# Patient Record
Sex: Female | Born: 1985 | Race: Black or African American | Hispanic: No | Marital: Single | Smoking: Never smoker
Health system: Southern US, Community
[De-identification: ages and names within clinical notes are randomized; demographics above are authoritative.]

## PROBLEM LIST (undated history)

## (undated) DIAGNOSIS — E559 Vitamin D deficiency, unspecified: Secondary | ICD-10-CM

## (undated) DIAGNOSIS — E039 Hypothyroidism, unspecified: Secondary | ICD-10-CM

## (undated) DIAGNOSIS — D649 Anemia, unspecified: Secondary | ICD-10-CM

## (undated) HISTORY — PX: INDUCED ABORTION: SHX677

## (undated) HISTORY — DX: Vitamin D deficiency, unspecified: E55.9

---

## 2007-04-10 ENCOUNTER — Other Ambulatory Visit: Payer: Self-pay

## 2007-04-11 ENCOUNTER — Inpatient Hospital Stay: Payer: Self-pay | Admitting: Internal Medicine

## 2007-04-11 ENCOUNTER — Other Ambulatory Visit: Payer: Self-pay

## 2007-11-25 ENCOUNTER — Inpatient Hospital Stay: Payer: Self-pay | Admitting: Internal Medicine

## 2007-11-25 ENCOUNTER — Other Ambulatory Visit: Payer: Self-pay

## 2007-12-05 ENCOUNTER — Observation Stay: Payer: Self-pay | Admitting: Internal Medicine

## 2007-12-05 ENCOUNTER — Other Ambulatory Visit: Payer: Self-pay

## 2007-12-31 ENCOUNTER — Emergency Department: Payer: Self-pay | Admitting: Emergency Medicine

## 2008-01-03 ENCOUNTER — Other Ambulatory Visit: Payer: Self-pay

## 2008-01-04 ENCOUNTER — Other Ambulatory Visit: Payer: Self-pay

## 2008-01-04 ENCOUNTER — Inpatient Hospital Stay: Payer: Self-pay | Admitting: Internal Medicine

## 2008-01-12 ENCOUNTER — Observation Stay: Payer: Self-pay | Admitting: Internal Medicine

## 2008-02-16 ENCOUNTER — Inpatient Hospital Stay: Payer: Self-pay | Admitting: Internal Medicine

## 2008-02-16 ENCOUNTER — Other Ambulatory Visit: Payer: Self-pay

## 2008-03-08 ENCOUNTER — Emergency Department: Payer: Self-pay | Admitting: Emergency Medicine

## 2008-05-23 ENCOUNTER — Inpatient Hospital Stay: Payer: Self-pay | Admitting: Internal Medicine

## 2008-06-21 ENCOUNTER — Inpatient Hospital Stay: Payer: Self-pay | Admitting: Internal Medicine

## 2008-06-25 ENCOUNTER — Inpatient Hospital Stay: Payer: Self-pay | Admitting: Psychiatry

## 2009-01-24 ENCOUNTER — Inpatient Hospital Stay: Payer: Self-pay | Admitting: Internal Medicine

## 2009-01-28 ENCOUNTER — Inpatient Hospital Stay: Payer: Self-pay | Admitting: Psychiatry

## 2009-05-04 ENCOUNTER — Inpatient Hospital Stay (HOSPITAL_COMMUNITY): Admission: EM | Admit: 2009-05-04 | Discharge: 2009-05-06 | Payer: Self-pay | Admitting: Emergency Medicine

## 2009-08-12 ENCOUNTER — Ambulatory Visit: Payer: Self-pay | Admitting: Family Medicine

## 2009-11-09 ENCOUNTER — Ambulatory Visit: Payer: Self-pay | Admitting: Internal Medicine

## 2009-11-09 ENCOUNTER — Encounter (INDEPENDENT_AMBULATORY_CARE_PROVIDER_SITE_OTHER): Payer: Self-pay | Admitting: Family Medicine

## 2009-11-09 LAB — CONVERTED CEMR LAB
ALT: 23 units/L (ref 0–35)
AST: 13 units/L (ref 0–37)
Albumin: 4.4 g/dL (ref 3.5–5.2)
Alkaline Phosphatase: 104 units/L (ref 39–117)
BUN: 19 mg/dL (ref 6–23)
Basophils Absolute: 0 10*3/uL (ref 0.0–0.1)
Basophils Relative: 1 % (ref 0–1)
CO2: 23 meq/L (ref 19–32)
Calcium: 9.7 mg/dL (ref 8.4–10.5)
Chloride: 92 meq/L — ABNORMAL LOW (ref 96–112)
Creatinine, Ser: 1.06 mg/dL (ref 0.40–1.20)
Eosinophils Absolute: 0.2 10*3/uL (ref 0.0–0.7)
Eosinophils Relative: 3 % (ref 0–5)
Glucose, Bld: 527 mg/dL — ABNORMAL HIGH (ref 70–99)
HCT: 38.2 % (ref 36.0–46.0)
Hemoglobin: 13.8 g/dL (ref 12.0–15.0)
Hgb A1c MFr Bld: 13.5 % — ABNORMAL HIGH (ref <5.7)
Lymphocytes Relative: 33 % (ref 12–46)
Lymphs Abs: 2.1 10*3/uL (ref 0.7–4.0)
MCHC: 36.1 g/dL — ABNORMAL HIGH (ref 30.0–36.0)
MCV: 84.1 fL (ref 78.0–100.0)
Microalb, Ur: 67.62 mg/dL — ABNORMAL HIGH (ref 0.00–1.89)
Monocytes Absolute: 0.4 10*3/uL (ref 0.1–1.0)
Monocytes Relative: 6 % (ref 3–12)
Neutro Abs: 3.7 10*3/uL (ref 1.7–7.7)
Neutrophils Relative %: 58 % (ref 43–77)
Platelets: 264 10*3/uL (ref 150–400)
Potassium: 4.7 meq/L (ref 3.5–5.3)
RBC: 4.54 M/uL (ref 3.87–5.11)
RDW: 12.3 % (ref 11.5–15.5)
Sodium: 128 meq/L — ABNORMAL LOW (ref 135–145)
TSH: 1.488 microintl units/mL (ref 0.350–4.500)
Total Bilirubin: 0.8 mg/dL (ref 0.3–1.2)
Total Protein: 7.4 g/dL (ref 6.0–8.3)
Vit D, 25-Hydroxy: 14 ng/mL — ABNORMAL LOW (ref 30–89)
WBC: 6.4 10*3/uL (ref 4.0–10.5)

## 2009-11-29 ENCOUNTER — Ambulatory Visit: Payer: Self-pay | Admitting: Internal Medicine

## 2009-12-12 ENCOUNTER — Encounter (INDEPENDENT_AMBULATORY_CARE_PROVIDER_SITE_OTHER): Payer: Self-pay | Admitting: Family Medicine

## 2009-12-12 ENCOUNTER — Ambulatory Visit: Payer: Self-pay | Admitting: Internal Medicine

## 2009-12-12 LAB — CONVERTED CEMR LAB: Microalb, Ur: 18.78 mg/dL — ABNORMAL HIGH (ref 0.00–1.89)

## 2010-01-31 ENCOUNTER — Ambulatory Visit: Payer: Self-pay | Admitting: Internal Medicine

## 2010-01-31 ENCOUNTER — Encounter (INDEPENDENT_AMBULATORY_CARE_PROVIDER_SITE_OTHER): Payer: Self-pay | Admitting: Family Medicine

## 2010-01-31 LAB — CONVERTED CEMR LAB
Aldosterone, Serum: 24
Cortisol, Plasma: 8.8 ug/dL

## 2010-05-03 ENCOUNTER — Emergency Department: Payer: Self-pay | Admitting: Emergency Medicine

## 2010-06-08 ENCOUNTER — Encounter: Payer: Self-pay | Admitting: Internal Medicine

## 2010-07-04 ENCOUNTER — Encounter (INDEPENDENT_AMBULATORY_CARE_PROVIDER_SITE_OTHER): Payer: Self-pay | Admitting: Internal Medicine

## 2010-07-04 ENCOUNTER — Other Ambulatory Visit
Admission: RE | Admit: 2010-07-04 | Discharge: 2010-07-04 | Payer: Self-pay | Source: Home / Self Care | Admitting: Family Medicine

## 2010-07-04 ENCOUNTER — Other Ambulatory Visit: Payer: Self-pay | Admitting: Family Medicine

## 2010-07-04 LAB — CONVERTED CEMR LAB
Chlamydia, DNA Probe: NEGATIVE
Cholesterol: 126 mg/dL (ref 0–200)
DHEA-SO4: 192 ug/dL (ref 35–430)
FSH: 4.4 milliintl units/mL
GC Probe Amp, Genital: NEGATIVE
HCT: 35 % — ABNORMAL LOW (ref 36.0–46.0)
HDL: 65 mg/dL (ref >39)
Hemoglobin: 11.4 g/dL — ABNORMAL LOW (ref 12.0–15.0)
LDL Cholesterol: 49 mg/dL (ref 0–99)
MCHC: 32.6 g/dL (ref 30.0–36.0)
MCV: 91.4 fL (ref 78.0–100.0)
Platelets: 422 10*3/uL — ABNORMAL HIGH (ref 150–400)
Prolactin: 4.5 ng/mL
RBC: 3.83 M/uL — ABNORMAL LOW (ref 3.87–5.11)
RDW: 16.8 % — ABNORMAL HIGH (ref 11.5–15.5)
TSH: 1.681 microintl units/mL (ref 0.350–4.500)
Total CHOL/HDL Ratio: 1.9
Triglycerides: 61 mg/dL (ref <150)
VLDL: 12 mg/dL (ref 0–40)
WBC: 7 10*3/uL (ref 4.0–10.5)

## 2010-07-05 ENCOUNTER — Encounter (INDEPENDENT_AMBULATORY_CARE_PROVIDER_SITE_OTHER): Payer: Self-pay | Admitting: *Deleted

## 2010-09-11 ENCOUNTER — Encounter (INDEPENDENT_AMBULATORY_CARE_PROVIDER_SITE_OTHER): Payer: Self-pay | Admitting: *Deleted

## 2010-09-11 LAB — CONVERTED CEMR LAB
Basophils Absolute: 0 K/uL
Basophils Relative: 0 %
Eosinophils Absolute: 0.2 K/uL
Eosinophils Relative: 3 %
HCT: 37.9 %
Hemoglobin: 12.2 g/dL
Iron: 44 ug/dL
Lymphocytes Relative: 34 %
Lymphs Abs: 2.4 K/uL
MCHC: 32.2 g/dL
MCV: 92.2 fL
Monocytes Absolute: 0.4 K/uL
Monocytes Relative: 5 %
Neutro Abs: 4.1 K/uL
Neutrophils Relative %: 58 %
Platelets: 244 K/uL
RBC: 4.11 M/uL
RDW: 13.8 %
Retic Ct Pct: 1 %
Saturation Ratios: 12 % — ABNORMAL LOW
TIBC: 370 ug/dL
UIBC: 326 ug/dL
WBC: 7 10*3/microliter

## 2010-09-17 ENCOUNTER — Emergency Department: Payer: Self-pay | Admitting: Emergency Medicine

## 2010-09-20 LAB — BASIC METABOLIC PANEL
BUN: 21 mg/dL (ref 6–23)
BUN: 3 mg/dL — ABNORMAL LOW (ref 6–23)
CO2: 26 mEq/L (ref 19–32)
Calcium: 8.1 mg/dL — ABNORMAL LOW (ref 8.4–10.5)
Calcium: 9.1 mg/dL (ref 8.4–10.5)
Creatinine, Ser: 0.77 mg/dL (ref 0.4–1.2)
GFR calc Af Amer: >60 mL/min (ref >60)
GFR calc non Af Amer: >60 mL/min (ref >60)
GFR calc non Af Amer: >60 mL/min (ref >60)
Glucose, Bld: 258 mg/dL — ABNORMAL HIGH (ref 70–99)
Glucose, Bld: 391 mg/dL — ABNORMAL HIGH (ref 70–99)
Potassium: 2.8 mEq/L — ABNORMAL LOW (ref 3.5–5.1)
Sodium: 139 mEq/L (ref 135–145)
Sodium: 139 mEq/L (ref 135–145)

## 2010-09-20 LAB — CBC
HCT: 34.2 % — ABNORMAL LOW (ref 36.0–46.0)
HCT: 36.1 % (ref 36.0–46.0)
Hemoglobin: 11.4 g/dL — ABNORMAL LOW (ref 12.0–15.0)
Hemoglobin: 12.2 g/dL (ref 12.0–15.0)
Hemoglobin: 18 g/dL — ABNORMAL HIGH (ref 12.0–15.0)
MCHC: 33.4 g/dL (ref 30.0–36.0)
MCHC: 33.8 g/dL (ref 30.0–36.0)
MCV: 94.4 fL (ref 78.0–100.0)
Platelets: 172 10*3/uL (ref 150–400)
RBC: 3.83 MIL/uL — ABNORMAL LOW (ref 3.87–5.11)
RBC: 5.73 MIL/uL — ABNORMAL HIGH (ref 3.87–5.11)
RDW: 12.3 % (ref 11.5–15.5)
WBC: 14.8 10*3/uL — ABNORMAL HIGH (ref 4.0–10.5)
WBC: 7.1 10*3/uL (ref 4.0–10.5)

## 2010-09-20 LAB — LIPID PANEL
Cholesterol: 156 mg/dL (ref 0–200)
HDL: 51 mg/dL (ref >39)
LDL Cholesterol: 85 mg/dL (ref 0–99)
Total CHOL/HDL Ratio: 3.1 RATIO

## 2010-09-20 LAB — GLUCOSE, CAPILLARY
Glucose-Capillary: 103 mg/dL — ABNORMAL HIGH (ref 70–99)
Glucose-Capillary: 112 mg/dL — ABNORMAL HIGH (ref 70–99)
Glucose-Capillary: 175 mg/dL — ABNORMAL HIGH (ref 70–99)
Glucose-Capillary: 185 mg/dL — ABNORMAL HIGH (ref 70–99)
Glucose-Capillary: 248 mg/dL — ABNORMAL HIGH (ref 70–99)
Glucose-Capillary: 33 mg/dL — CL (ref 70–99)
Glucose-Capillary: 37 mg/dL — CL (ref 70–99)
Glucose-Capillary: 58 mg/dL — ABNORMAL LOW (ref 70–99)
Glucose-Capillary: 60 mg/dL — ABNORMAL LOW (ref 70–99)
Glucose-Capillary: 60 mg/dL — ABNORMAL LOW (ref 70–99)
Glucose-Capillary: 61 mg/dL — ABNORMAL LOW (ref 70–99)
Glucose-Capillary: 66 mg/dL — ABNORMAL LOW (ref 70–99)
Glucose-Capillary: 67 mg/dL — ABNORMAL LOW (ref 70–99)
Glucose-Capillary: 92 mg/dL (ref 70–99)
Glucose-Capillary: 94 mg/dL (ref 70–99)

## 2010-09-20 LAB — COMPREHENSIVE METABOLIC PANEL
ALT: 19 U/L (ref 0–35)
ALT: 27 U/L (ref 0–35)
AST: 24 U/L (ref 0–37)
Alkaline Phosphatase: 80 U/L (ref 39–117)
BUN: 32 mg/dL — ABNORMAL HIGH (ref 6–23)
CO2: 16 mEq/L — ABNORMAL LOW (ref 19–32)
CO2: 21 mEq/L (ref 19–32)
Calcium: 10.9 mg/dL — ABNORMAL HIGH (ref 8.4–10.5)
Calcium: 8.1 mg/dL — ABNORMAL LOW (ref 8.4–10.5)
Chloride: 105 mEq/L (ref 96–112)
Creatinine, Ser: 1.23 mg/dL — ABNORMAL HIGH (ref 0.4–1.2)
GFR calc non Af Amer: 38 mL/min — ABNORMAL LOW (ref >60)
GFR calc non Af Amer: 55 mL/min — ABNORMAL LOW (ref >60)
Glucose, Bld: 173 mg/dL — ABNORMAL HIGH (ref 70–99)
Glucose, Bld: 231 mg/dL — ABNORMAL HIGH (ref 70–99)
Sodium: 145 mEq/L (ref 135–145)
Total Bilirubin: 1.3 mg/dL — ABNORMAL HIGH (ref 0.3–1.2)

## 2010-09-20 LAB — POCT I-STAT, CHEM 8
Chloride: 116 mEq/L — ABNORMAL HIGH (ref 96–112)
HCT: 39 % (ref 36.0–46.0)
Hemoglobin: 13.3 g/dL (ref 12.0–15.0)
Potassium: 3.8 mEq/L (ref 3.5–5.1)
Sodium: 147 mEq/L — ABNORMAL HIGH (ref 135–145)

## 2010-09-20 LAB — BLOOD GAS, ARTERIAL
Bicarbonate: 16.8 mEq/L — ABNORMAL LOW (ref 20.0–24.0)
FIO2: 0.21 %
O2 Saturation: 95.6 %
Patient temperature: 98.6
TCO2: 14.4 mmol/L (ref 0–100)
pH, Arterial: 7.461 — ABNORMAL HIGH (ref 7.350–7.400)
pO2, Arterial: 119 mmHg — ABNORMAL HIGH (ref 80.0–100.0)

## 2010-09-20 LAB — URINE MICROSCOPIC-ADD ON

## 2010-09-20 LAB — DIFFERENTIAL
Basophils Absolute: 0.4 10*3/uL — ABNORMAL HIGH (ref 0.0–0.1)
Eosinophils Absolute: 0 10*3/uL (ref 0.0–0.7)
Eosinophils Absolute: 0 10*3/uL (ref 0.0–0.7)
Lymphocytes Relative: 9 % — ABNORMAL LOW (ref 12–46)
Lymphs Abs: 0.6 10*3/uL — ABNORMAL LOW (ref 0.7–4.0)
Lymphs Abs: 1.1 10*3/uL (ref 0.7–4.0)
Monocytes Absolute: 0.6 10*3/uL (ref 0.1–1.0)
Monocytes Relative: 4 % (ref 3–12)
Neutro Abs: 10.2 10*3/uL — ABNORMAL HIGH (ref 1.7–7.7)
Neutro Abs: 13.2 10*3/uL — ABNORMAL HIGH (ref 1.7–7.7)
Neutrophils Relative %: 84 % — ABNORMAL HIGH (ref 43–77)
WBC Morphology: INCREASED

## 2010-09-20 LAB — URINALYSIS, ROUTINE W REFLEX MICROSCOPIC
Glucose, UA: >1000 mg/dL — AB
Hgb urine dipstick: NEGATIVE
Ketones, ur: >80 mg/dL — AB
Protein, ur: 100 mg/dL — AB
Urobilinogen, UA: 0.2 mg/dL (ref 0.0–1.0)

## 2010-09-20 LAB — CULTURE, BLOOD (ROUTINE X 2): Culture: NO GROWTH

## 2010-09-20 LAB — URINE CULTURE: Colony Count: 25000

## 2010-12-21 ENCOUNTER — Emergency Department: Payer: Self-pay | Admitting: Emergency Medicine

## 2011-06-05 ENCOUNTER — Emergency Department: Payer: Self-pay | Admitting: Emergency Medicine

## 2011-06-17 ENCOUNTER — Inpatient Hospital Stay: Payer: Self-pay | Admitting: Obstetrics & Gynecology

## 2011-06-19 LAB — BASIC METABOLIC PANEL
Anion Gap: 13 (ref 7–16)
Chloride: 105 mmol/L (ref 98–107)
Co2: 21 mmol/L (ref 21–32)
Creatinine: 1.04 mg/dL (ref 0.60–1.30)
EGFR (African American): >60
EGFR (Non-African Amer.): >60
Potassium: 3.1 mmol/L — ABNORMAL LOW (ref 3.5–5.1)
Sodium: 139 mmol/L (ref 136–145)

## 2011-07-09 ENCOUNTER — Inpatient Hospital Stay: Payer: Self-pay

## 2011-07-09 LAB — COMPREHENSIVE METABOLIC PANEL
Alkaline Phosphatase: 100 U/L (ref 50–136)
Anion Gap: 22 — ABNORMAL HIGH (ref 7–16)
BUN: 54 mg/dL — ABNORMAL HIGH (ref 7–18)
Bilirubin,Total: 0.6 mg/dL (ref 0.2–1.0)
Calcium, Total: 9.7 mg/dL (ref 8.5–10.1)
Creatinine: 2.48 mg/dL — ABNORMAL HIGH (ref 0.60–1.30)
EGFR (African American): 31 — ABNORMAL LOW
EGFR (Non-African Amer.): 25 — ABNORMAL LOW
Glucose: 594 mg/dL (ref 65–99)
Potassium: 4.7 mmol/L (ref 3.5–5.1)
Sodium: 145 mmol/L (ref 136–145)
Total Protein: 8.8 g/dL — ABNORMAL HIGH (ref 6.4–8.2)

## 2011-07-09 LAB — CBC
HGB: 14.3 g/dL (ref 12.0–16.0)
MCH: 30.2 pg (ref 26.0–34.0)
MCV: 90 fL (ref 80–100)
Platelet: 294 10*3/uL (ref 150–440)
RBC: 4.73 10*6/uL (ref 3.80–5.20)

## 2011-07-10 LAB — URINALYSIS, COMPLETE
Glucose,UR: >=500 mg/dL (ref 0–75)
Hyaline Cast: 1
Nitrite: NEGATIVE
Specific Gravity: 1.016 (ref 1.003–1.030)
WBC UR: 49 /HPF (ref 0–5)

## 2011-07-10 LAB — BASIC METABOLIC PANEL
Anion Gap: 11 (ref 7–16)
BUN: 43 mg/dL — ABNORMAL HIGH (ref 7–18)
Co2: 24 mmol/L (ref 21–32)
Creatinine: 2.2 mg/dL — ABNORMAL HIGH (ref 0.60–1.30)
EGFR (African American): 35 — ABNORMAL LOW
EGFR (Non-African Amer.): 33 — ABNORMAL LOW
EGFR (Non-African Amer.): 29 — ABNORMAL LOW
Glucose: 62 mg/dL — ABNORMAL LOW (ref 65–99)
Glucose: 111 mg/dL — ABNORMAL HIGH (ref 65–99)
Potassium: 4.1 mmol/L (ref 3.5–5.1)
Potassium: 4.5 mmol/L (ref 3.5–5.1)
Sodium: 155 mmol/L — ABNORMAL HIGH (ref 136–145)
Sodium: 155 mmol/L — ABNORMAL HIGH (ref 136–145)

## 2011-07-11 LAB — COMPREHENSIVE METABOLIC PANEL
Alkaline Phosphatase: 72 U/L (ref 50–136)
Anion Gap: 13 (ref 7–16)
BUN: 30 mg/dL — ABNORMAL HIGH (ref 7–18)
Bilirubin,Total: 0.5 mg/dL (ref 0.2–1.0)
Calcium, Total: 8.7 mg/dL (ref 8.5–10.1)
Co2: 20 mmol/L — ABNORMAL LOW (ref 21–32)
Creatinine: 1.86 mg/dL — ABNORMAL HIGH (ref 0.60–1.30)
EGFR (African American): 43 — ABNORMAL LOW
EGFR (Non-African Amer.): 35 — ABNORMAL LOW
Glucose: 299 mg/dL — ABNORMAL HIGH (ref 65–99)
Osmolality: 304 (ref 275–301)
Potassium: 3.8 mmol/L (ref 3.5–5.1)
SGOT(AST): 27 U/L (ref 15–37)
Sodium: 144 mmol/L (ref 136–145)

## 2011-07-11 LAB — CBC WITH DIFFERENTIAL/PLATELET
Basophil #: 0 10*3/uL (ref 0.0–0.1)
Eosinophil %: 0.5 %
HGB: 10.3 g/dL — ABNORMAL LOW (ref 12.0–16.0)
Lymphocyte #: 1.3 10*3/uL (ref 1.0–3.6)
Lymphocyte %: 15.4 %
MCH: 29.8 pg (ref 26.0–34.0)
Monocyte #: 0.7 10*3/uL (ref 0.0–0.7)
Monocyte %: 8.4 %
Neutrophil %: 75.2 %
Platelet: 154 10*3/uL (ref 150–440)
RBC: 3.45 10*6/uL — ABNORMAL LOW (ref 3.80–5.20)
RDW: 15.7 % — ABNORMAL HIGH (ref 11.5–14.5)
WBC: 8.1 10*3/uL (ref 3.6–11.0)

## 2011-07-12 LAB — BASIC METABOLIC PANEL
Anion Gap: 13 (ref 7–16)
Calcium, Total: 8.3 mg/dL — ABNORMAL LOW (ref 8.5–10.1)
Chloride: 104 mmol/L (ref 98–107)
Co2: 21 mmol/L (ref 21–32)
EGFR (African American): 54 — ABNORMAL LOW
Sodium: 138 mmol/L (ref 136–145)

## 2011-08-15 ENCOUNTER — Emergency Department (HOSPITAL_COMMUNITY)
Admission: EM | Admit: 2011-08-15 | Discharge: 2011-08-15 | Disposition: A | Discharge status: Home / Self Care | Payer: Medicare Other | Attending: Emergency Medicine | Admitting: Emergency Medicine

## 2011-08-15 DIAGNOSIS — R569 Unspecified convulsions: Secondary | ICD-10-CM | POA: Insufficient documentation

## 2011-08-15 DIAGNOSIS — E162 Hypoglycemia, unspecified: Secondary | ICD-10-CM | POA: Insufficient documentation

## 2011-08-15 DIAGNOSIS — N390 Urinary tract infection, site not specified: Secondary | ICD-10-CM | POA: Insufficient documentation

## 2011-08-15 DIAGNOSIS — Z794 Long term (current) use of insulin: Secondary | ICD-10-CM | POA: Insufficient documentation

## 2011-08-15 LAB — PREGNANCY, URINE: Preg Test, Ur: POSITIVE — AB

## 2011-08-15 LAB — CBC
HCT: 29.4 % — ABNORMAL LOW (ref 36.0–46.0)
MCV: 92.5 fL (ref 78.0–100.0)
RBC: 3.18 MIL/uL — ABNORMAL LOW (ref 3.87–5.11)
WBC: 5.3 10*3/uL (ref 4.0–10.5)

## 2011-08-15 LAB — URINE MICROSCOPIC-ADD ON

## 2011-08-15 LAB — DIFFERENTIAL
Eosinophils Relative: 2 % (ref 0–5)
Lymphocytes Relative: 28 % (ref 12–46)
Lymphs Abs: 1.5 10*3/uL (ref 0.7–4.0)
Monocytes Absolute: 0.3 10*3/uL (ref 0.1–1.0)
Neutro Abs: 3.4 10*3/uL (ref 1.7–7.7)

## 2011-08-15 LAB — BASIC METABOLIC PANEL
CO2: 26 mEq/L (ref 19–32)
Calcium: 9.1 mg/dL (ref 8.4–10.5)
Chloride: 107 mEq/L (ref 96–112)
Creatinine, Ser: 1.06 mg/dL (ref 0.50–1.10)
Glucose, Bld: 60 mg/dL — ABNORMAL LOW (ref 70–99)
Sodium: 143 mEq/L (ref 135–145)

## 2011-08-15 LAB — URINALYSIS, ROUTINE W REFLEX MICROSCOPIC
Glucose, UA: 250 mg/dL — AB
Protein, ur: NEGATIVE mg/dL
pH: 7 (ref 5.0–8.0)

## 2011-08-15 LAB — GLUCOSE, CAPILLARY: Glucose-Capillary: 84 mg/dL (ref 70–99)

## 2011-08-15 MED ORDER — CIPROFLOXACIN HCL 500 MG PO TABS
250.0000 mg | ORAL_TABLET | Freq: Once | ORAL | Status: AC
  Administered 2011-08-15: 250 mg via ORAL
  Filled 2011-08-15: qty 1

## 2011-08-15 MED ORDER — CIPROFLOXACIN HCL 250 MG PO TABS: 250.0000 mg | ORAL_TABLET | Freq: Two times a day (BID) | ORAL | Status: DC

## 2011-08-15 MED ORDER — DEXTROSE 50 % IV SOLN
INTRAVENOUS | Status: AC
  Filled 2011-08-15: qty 50

## 2011-08-15 NOTE — ED Notes (Signed)
Check blood sugar it was 120 noticed RN Lowella Bandy

## 2011-08-15 NOTE — Discharge Instructions (Signed)
Please decrease your lantus dose to 10 units once a day and follow up with your doctor next week as scheduled.    Hypoglycemia (Low Blood Sugar) Hypoglycemia is when the glucose (sugar) in your blood is too low. Hypoglycemia can happen for many reasons. It can happen to people with or without diabetes. Hypoglycemia can develop quickly and can be a medical emergency.  CAUSES  Having hypoglycemia does not mean that you will develop diabetes. Different causes include:  Missed or delayed meals or not enough carbohydrates eaten.   Medication overdose. This could be by accident or deliberate. If by accident, your medication may need to be adjusted or changed.   Exercise or increased activity without adjustments in carbohydrates or medications.   A nerve disorder that affects body functions like your heart rate, blood pressure and digestion (autonomic neuropathy).   A condition where the stomach muscles do not function properly (gastroparesis). Therefore, medications may not absorb properly.   The inability to recognize the signs of hypoglycemia (hypoglycemic unawareness).   Absorption of insulin - may be altered.   Alcohol consumption.   Pregnancy/menstrual cycles/postpartum. This may be due to hormones.   Certain kinds of tumors. This is very rare.  SYMPTOMS   Sweating.   Hunger.   Dizziness.   Blurred vision.   Drowsiness.   Weakness.   Headache.   Rapid heart beat.   Shakiness.   Nervousness.  DIAGNOSIS  Diagnosis is made by monitoring blood glucose in one or all of the following ways:  Fingerstick blood glucose monitoring.   Laboratory results.  TREATMENT  If you think your blood glucose is low:  Check your blood glucose, if possible. If it is less than 70 mg/dl, take one of the following:   3-4 glucose tablets.    cup juice (prefer clear like apple).    cup "regular" soda pop.   1 cup milk.   -1 tube of glucose gel.   5-6 hard candies.   Do  not over treat because your blood glucose (sugar) will only go too high.   Wait 15 minutes and recheck your blood glucose. If it is still less than 70 mg/dl (or below your target range), repeat treatment.   Eat a snack if it is more than one hour until your next meal.  Sometimes, your blood glucose may go so low that you are unable to treat yourself. You may need someone to help you. You may even pass out or be unable to swallow. This may require you to get an injection of glucagon, which raises the blood glucose. HOME CARE INSTRUCTIONS  Check blood glucose as recommended by your caregiver.   Take medication as prescribed by your caregiver.   Follow your meal plan. Do not skip meals. Eat on time.   If you are going to drink alcohol, drink it only with meals.   Check your blood glucose before driving.   Check your blood glucose before and after exercise. If you exercise longer or different than usual, be sure to check blood glucose more frequently.   Always carry treatment with you. Glucose tablets are the easiest to carry.   Always wear medical alert jewelry or carry some form of identification that states that you have diabetes. This will alert people that you have diabetes. If you have hypoglycemia, they will have a better idea on what to do.  SEEK MEDICAL CARE IF:   You are having problems keeping your blood sugar at target range.  You are having frequent episodes of hypoglycemia.   You feel you might be having side effects from your medicines.   You have symptoms of an illness that is not improving after 3-4 days.   You notice a change in vision or a new problem with your vision.  SEEK IMMEDIATE MEDICAL CARE IF:   You are a family member or friend of a person whose blood glucose goes below 70 mg/dl and is accompanied by:   Confusion.   A change in mental status.   The inability to swallow.   Passing out.  Document Released: 06/04/2005 Document Revised: 02/14/2011  Document Reviewed: 01/27/2009 [facility] Patient Information 2012 [place], [place].  Urinary Tract Infection Infections of the urinary tract can start in several places. A bladder infection (cystitis), a kidney infection (pyelonephritis), and a prostate infection (prostatitis) are different types of urinary tract infections (UTIs). They usually get better if treated with medicines (antibiotics) that kill germs. Take all the medicine until it is gone. You or your child may feel better in a few days, but TAKE ALL MEDICINE or the infection may not respond and may become more difficult to treat. HOME CARE INSTRUCTIONS   Drink enough water and fluids to keep the urine clear or pale yellow. Cranberry juice is especially recommended, in addition to large amounts of water.   Avoid caffeine, tea, and carbonated beverages. They tend to irritate the bladder.   Alcohol may irritate the prostate.   Only take over-the-counter or prescription medicines for pain, discomfort, or fever as directed by your caregiver.  To prevent further infections:  Empty the bladder often. Avoid holding urine for long periods of time.   After a bowel movement, women should cleanse from front to back. Use each tissue only once.   Empty the bladder before and after sexual intercourse.  FINDING OUT THE RESULTS OF YOUR TEST Not all test results are available during your visit. If your or your child's test results are not back during the visit, make an appointment with your caregiver to find out the results. Do not assume everything is normal if you have not heard from your caregiver or the medical facility. It is important for you to follow up on all test results. SEEK MEDICAL CARE IF:   There is back pain.   Your baby is older than 3 months with a rectal temperature of 100.5 F (38.1 C) or higher for more than 1 day.   Your or your child's problems (symptoms) are no better in 3 days. Return sooner if you or your child is  getting worse.  SEEK IMMEDIATE MEDICAL CARE IF:   There is severe back pain or lower abdominal pain.   You or your child develops chills.   You have a fever.   Your baby is older than 3 months with a rectal temperature of 102 F (38.9 C) or higher.   Your baby is 22 months old or younger with a rectal temperature of 100.4 F (38 C) or higher.   There is nausea or vomiting.   There is continued burning or discomfort with urination.  MAKE SURE YOU:   Understand these instructions.   Will watch your condition.   Will get help right away if you are not doing well or get worse.  Document Released: 03/14/2005 Document Revised: 02/14/2011 Document Reviewed: 10/17/2006 [facility] Patient Information 2012 [place], [place].

## 2011-08-15 NOTE — ED Provider Notes (Signed)
History     CSN: [id number]  Arrival date & time 08/15/11  4132   First MD Initiated Contact with Patient 08/15/11 [phone]      Chief Complaint  Patient presents with  . Seizures    (Consider location/radiation/quality/duration/timing/severity/associated sxs/prior treatment) HPI Comments: Patient presents to the emergency department by EMS for seizure.  EMS noted her blood sugar in the field to be 10.  They did administer D50 in the field which is improved her sugar here.  On arrival here patient is fully alert and oriented and feels well.  She does note that she has been taking her continued Lantus 10 units twice a day dosing per her primary care physician's recommendations when she became pregnant 3 months ago.  Her prepregnancy dose of 10 Lantus 10 units once a day.  She's noted that over the last 2 weeks since she had an abortion completed at 3 months pregnant that she's had multiple low blood sugar measurements.  She has not decreased her Lantus to her prepregnancy dosing at this time.  She does have followup with her primary care physician next week.  Patient otherwise denies any other fevers, dysuria, abdominal pain, chest pain, cough or other symptoms at this time.  Patient is a 26 y.o. female presenting with seizures. The history is provided by the patient. No language interpreter was used.  Seizures  This is a new problem. The current episode started less than 1 hour ago. The problem has been resolved. There was 1 seizure. The most recent episode lasted less than 30 seconds. Pertinent negatives include no confusion, no headaches, no chest pain, no cough, no nausea, no vomiting and no diarrhea.    No past medical history on file.  No past surgical history on file.  No family history on file.  History  Substance Use Topics  . Smoking status: Not on file  . Smokeless tobacco: Not on file  . Alcohol Use: Not on file    OB History    No data available      Review of Systems    Constitutional: Negative.  Negative for fever and chills.  HENT: Negative.   Eyes: Negative.  Negative for discharge and redness.  Respiratory: Negative.  Negative for cough and shortness of breath.   Cardiovascular: Negative.  Negative for chest pain.  Gastrointestinal: Negative.  Negative for nausea, vomiting, abdominal pain and diarrhea.  Genitourinary: Negative.  Negative for dysuria and vaginal discharge.  Musculoskeletal: Negative.  Negative for back pain.  Skin: Negative.  Negative for color change and rash.  Neurological: Positive for seizures. Negative for syncope and headaches.  Hematological: Negative.  Negative for adenopathy.  Psychiatric/Behavioral: Negative.  Negative for confusion.  All other systems reviewed and are negative.    Allergies  Review of patient's allergies indicates no known allergies.  Home Medications   Current Outpatient Rx  Name Route Sig Dispense Refill  . INSULIN ASPART 100 UNIT/ML [place] SOLN Subcutaneous Inject 2-12 Units into the skin 3 (three) times daily before meals. Per sliding scale    . INSULIN GLARGINE 100 UNIT/ML [place] SOLN Subcutaneous Inject 10 Units into the skin 2 (two) times daily.    Marland Kitchen LEVOTHYROXINE SODIUM 75 MCG PO TABS Oral Take 75 mcg by mouth daily.      BP 112/69  Pulse 98  Temp 94.1 F (34.5 C)  Resp 14  SpO2 100%  Physical Exam  Nursing note and vitals reviewed. Constitutional: She is oriented to person, place, and  time. She appears well-developed and well-nourished.  Non-toxic appearance. She does not have a sickly appearance.  HENT:  Head: Normocephalic and atraumatic.  Eyes: Conjunctivae, EOM and lids are normal. Pupils are equal, round, and reactive to light. No scleral icterus.  Neck: Trachea normal and normal range of motion. Neck supple.  Cardiovascular: Normal rate, regular rhythm and normal heart sounds.   Pulmonary/Chest: Effort normal and breath sounds normal. No respiratory distress. She has no wheezes. She  has no rales.  Abdominal: Soft. Normal appearance. There is no tenderness. There is no rebound, no guarding and no CVA tenderness.  Musculoskeletal: Normal range of motion.  Neurological: She is alert and oriented to person, place, and time. She has normal strength.  Skin: Skin is warm, dry and intact. No rash noted.  Psychiatric: She has a normal mood and affect. Her behavior is normal. Judgment and thought content normal.    ED Course  Procedures (including critical care time)  Labs Reviewed  CBC - Abnormal; Notable for the following:    RBC 3.18 (*)    Hemoglobin 9.7 (*)    HCT 29.4 (*)    RDW 20.4 (*)    All other components within normal limits  BASIC METABOLIC PANEL - Abnormal; Notable for the following:    Glucose, Bld 60 (*)    GFR calc non Af Amer 73 (*)    GFR calc Af Amer 84 (*)    All other components within normal limits  URINALYSIS, ROUTINE W REFLEX MICROSCOPIC - Abnormal; Notable for the following:    APPearance CLOUDY (*)    Glucose, UA 250 (*)    Hgb urine dipstick SMALL (*)    Leukocytes, UA LARGE (*)    All other components within normal limits  PREGNANCY, URINE - Abnormal; Notable for the following:    Preg Test, Ur POSITIVE (*)    All other components within normal limits  URINE MICROSCOPIC-ADD ON - Abnormal; Notable for the following:    Squamous Epithelial / LPF FEW (*)    Bacteria, UA MANY (*)    All other components within normal limits  GLUCOSE, CAPILLARY - Abnormal; Notable for the following:    Glucose-Capillary 120 (*)    All other components within normal limits  GLUCOSE, CAPILLARY  DIFFERENTIAL   No results found.   No diagnosis found.    MDM  Patient with seizure likely due to the significant hypoglycemia.  Therefore I did not plan to CT this patient's head given the other obvious cause for this patient's seizure this morning.  Her sugars remained stable here in the patient has tolerated by mouth intake.  She does have a UTI on  urinalysis and I will send her home with ciprofloxacin 250 mg by mouth twice a day for 3 days to treat for this.  Patient's pregnancy test is still positive as she had a recent abortion 2 weeks ago and was 3 months pregnant at the time of the procedure so likely her hCG levels are still falling at this time.  Patient has been instructed to continue to followup with her gynecologist for this to assure that she has had a complete abortion without any products of conception still left in the uterus.  Patient is not having any abdominal pain or continued bleeding at this time.  I also discussed with the patient decreasing her Lantus dose from 10 units twice a day to 10 units once a day patient's previous insulin dosing prior to pregnancy had  been 10 units at night and she was only increased due to pregnancy.  Patient notes she also has followup with her primary care physician next week.        Nat Christen, MD 08/15/11 1245

## 2011-08-15 NOTE — ED Notes (Signed)
Pt given meal tray. Ok'd by Dr. Golda Acre.

## 2011-08-15 NOTE — ED Notes (Signed)
Check patient cbg it was 59 notified RN Nikki of blood sugar, I also placed seziure pads on patients bed rails

## 2011-08-15 NOTE — ED Notes (Signed)
Pt found seizing by EMS after call for altered LOC. EMS found bbg 10. 1 amp d50 given PTA. bbg here 84.

## 2011-08-20 ENCOUNTER — Encounter (HOSPITAL_COMMUNITY): Payer: Self-pay | Admitting: Emergency Medicine

## 2011-08-20 ENCOUNTER — Inpatient Hospital Stay (HOSPITAL_COMMUNITY)
Admission: EM | Admit: 2011-08-20 | Discharge: 2011-08-22 | DRG: 638 | Disposition: A | Discharge status: Home / Self Care | Payer: Medicare Other | Attending: Internal Medicine | Admitting: Internal Medicine

## 2011-08-20 DIAGNOSIS — N390 Urinary tract infection, site not specified: Secondary | ICD-10-CM | POA: Diagnosis present

## 2011-08-20 DIAGNOSIS — R112 Nausea with vomiting, unspecified: Secondary | ICD-10-CM | POA: Diagnosis present

## 2011-08-20 DIAGNOSIS — E1110 Type 2 diabetes mellitus with ketoacidosis without coma: Secondary | ICD-10-CM

## 2011-08-20 DIAGNOSIS — Z794 Long term (current) use of insulin: Secondary | ICD-10-CM

## 2011-08-20 DIAGNOSIS — R109 Unspecified abdominal pain: Secondary | ICD-10-CM | POA: Diagnosis present

## 2011-08-20 DIAGNOSIS — B9689 Other specified bacterial agents as the cause of diseases classified elsewhere: Secondary | ICD-10-CM | POA: Diagnosis present

## 2011-08-20 DIAGNOSIS — E039 Hypothyroidism, unspecified: Secondary | ICD-10-CM | POA: Diagnosis present

## 2011-08-20 DIAGNOSIS — A499 Bacterial infection, unspecified: Secondary | ICD-10-CM | POA: Diagnosis present

## 2011-08-20 DIAGNOSIS — N760 Acute vaginitis: Secondary | ICD-10-CM

## 2011-08-20 DIAGNOSIS — E1010 Type 1 diabetes mellitus with ketoacidosis without coma: Principal | ICD-10-CM | POA: Diagnosis present

## 2011-08-20 HISTORY — DX: Hypothyroidism, unspecified: E03.9

## 2011-08-20 HISTORY — DX: Anemia, unspecified: D64.9

## 2011-08-20 NOTE — ED Notes (Signed)
PT. REPORTS MID ABDOMINAL PAIN FOR 3 DAYS WITH POOR APPETITE / NAUSEA , ALSO STATES ABORTION LAST FEB. 5 .

## 2011-08-21 ENCOUNTER — Encounter (HOSPITAL_COMMUNITY): Payer: Self-pay | Admitting: Internal Medicine

## 2011-08-21 ENCOUNTER — Emergency Department (HOSPITAL_COMMUNITY): Payer: Medicare Other

## 2011-08-21 DIAGNOSIS — R109 Unspecified abdominal pain: Secondary | ICD-10-CM | POA: Diagnosis present

## 2011-08-21 DIAGNOSIS — E1010 Type 1 diabetes mellitus with ketoacidosis without coma: Secondary | ICD-10-CM | POA: Diagnosis present

## 2011-08-21 DIAGNOSIS — N390 Urinary tract infection, site not specified: Secondary | ICD-10-CM | POA: Diagnosis present

## 2011-08-21 DIAGNOSIS — N760 Acute vaginitis: Secondary | ICD-10-CM

## 2011-08-21 DIAGNOSIS — E039 Hypothyroidism, unspecified: Secondary | ICD-10-CM | POA: Diagnosis present

## 2011-08-21 LAB — URINALYSIS, ROUTINE W REFLEX MICROSCOPIC
Bilirubin Urine: NEGATIVE
Glucose, UA: >1000 mg/dL — AB
Ketones, ur: >80 mg/dL — AB
Nitrite: NEGATIVE
Protein, ur: 30 mg/dL — AB
Specific Gravity, Urine: 1.028 (ref 1.005–1.030)
Urobilinogen, UA: 0.2 mg/dL (ref 0.0–1.0)
pH: 6 (ref 5.0–8.0)

## 2011-08-21 LAB — BASIC METABOLIC PANEL
BUN: 15 mg/dL (ref 6–23)
BUN: 15 mg/dL (ref 6–23)
BUN: 16 mg/dL (ref 6–23)
CO2: 18 mEq/L — ABNORMAL LOW (ref 19–32)
CO2: 21 mEq/L (ref 19–32)
Calcium: 8.5 mg/dL (ref 8.4–10.5)
Calcium: 9.7 mg/dL (ref 8.4–10.5)
Chloride: 106 mEq/L (ref 96–112)
Chloride: 99 mEq/L (ref 96–112)
Creatinine, Ser: 0.92 mg/dL (ref 0.50–1.10)
Creatinine, Ser: 0.96 mg/dL (ref 0.50–1.10)
GFR calc Af Amer: >90 mL/min (ref >90)
GFR calc Af Amer: >90 mL/min (ref >90)
GFR calc non Af Amer: 86 mL/min — ABNORMAL LOW (ref >90)
Glucose, Bld: 257 mg/dL — ABNORMAL HIGH (ref 70–99)
Glucose, Bld: 313 mg/dL — ABNORMAL HIGH (ref 70–99)
Potassium: 3.9 mEq/L (ref 3.5–5.1)
Potassium: 4 mEq/L (ref 3.5–5.1)
Sodium: 134 mEq/L — ABNORMAL LOW (ref 135–145)

## 2011-08-21 LAB — CBC
HCT: 33.6 % — ABNORMAL LOW (ref 36.0–46.0)
HCT: 37.2 % (ref 36.0–46.0)
Hemoglobin: 11.3 g/dL — ABNORMAL LOW (ref 12.0–15.0)
Hemoglobin: 12.6 g/dL (ref 12.0–15.0)
MCH: 31 pg (ref 26.0–34.0)
MCH: 31.8 pg (ref 26.0–34.0)
MCHC: 33.9 g/dL (ref 30.0–36.0)
MCV: 91.4 fL (ref 78.0–100.0)
Platelets: 294 10*3/uL (ref 150–400)
Platelets: 321 10*3/uL (ref 150–400)
RBC: 3.49 MIL/uL — ABNORMAL LOW (ref 3.87–5.11)
RBC: 3.68 MIL/uL — ABNORMAL LOW (ref 3.87–5.11)
RBC: 4.07 MIL/uL (ref 3.87–5.11)
RDW: 18.7 % — ABNORMAL HIGH (ref 11.5–15.5)
WBC: 5.9 10*3/uL (ref 4.0–10.5)
WBC: 6.5 10*3/uL (ref 4.0–10.5)
WBC: 6.5 10*3/uL (ref 4.0–10.5)

## 2011-08-21 LAB — GLUCOSE, CAPILLARY
Glucose-Capillary: 323 mg/dL — ABNORMAL HIGH (ref 70–99)
Glucose-Capillary: 347 mg/dL — ABNORMAL HIGH (ref 70–99)
Glucose-Capillary: 280 mg/dL — ABNORMAL HIGH (ref 70–99)
Glucose-Capillary: 165 mg/dL — ABNORMAL HIGH (ref 70–99)
Glucose-Capillary: 145 mg/dL — ABNORMAL HIGH (ref 70–99)
Glucose-Capillary: 217 mg/dL — ABNORMAL HIGH (ref 70–99)

## 2011-08-21 LAB — DIFFERENTIAL
Basophils Absolute: 0.1 10*3/uL (ref 0.0–0.1)
Basophils Relative: 1 % (ref 0–1)
Eosinophils Absolute: 0 10*3/uL (ref 0.0–0.7)
Eosinophils Relative: 1 % (ref 0–5)
Lymphocytes Relative: 31 % (ref 12–46)
Lymphs Abs: 1.8 10*3/uL (ref 0.7–4.0)
Monocytes Absolute: 0.4 10*3/uL (ref 0.1–1.0)
Monocytes Relative: 7 % (ref 3–12)
Neutro Abs: 3.6 10*3/uL (ref 1.7–7.7)
Neutrophils Relative %: 60 % (ref 43–77)

## 2011-08-21 LAB — URINE MICROSCOPIC-ADD ON

## 2011-08-21 LAB — WET PREP, GENITAL: Trich, Wet Prep: NONE SEEN

## 2011-08-21 LAB — POCT PREGNANCY, URINE: Preg Test, Ur: POSITIVE — AB

## 2011-08-21 LAB — HCG, QUANTITATIVE, PREGNANCY: hCG, Beta Chain, Quant, S: 61 m[IU]/mL — ABNORMAL HIGH (ref <5)

## 2011-08-21 LAB — TSH: TSH: 1.202 u[IU]/mL (ref 0.350–4.500)

## 2011-08-21 LAB — HEMOGLOBIN A1C
Hgb A1c MFr Bld: 8.1 % — ABNORMAL HIGH (ref <5.7)
Mean Plasma Glucose: 186 mg/dL — ABNORMAL HIGH (ref <117)

## 2011-08-21 LAB — LIPASE, BLOOD: Lipase: 16 U/L (ref 11–59)

## 2011-08-21 MED ORDER — POTASSIUM CHLORIDE 10 MEQ/100ML IV SOLN
10.0000 meq | INTRAVENOUS | Status: AC
  Administered 2011-08-21 (×4): 10 meq via INTRAVENOUS
  Filled 2011-08-21 (×3): qty 100

## 2011-08-21 MED ORDER — SODIUM CHLORIDE 0.9 % IV SOLN
INTRAVENOUS | Status: DC
  Administered 2011-08-21: 08:00:00 via INTRAVENOUS

## 2011-08-21 MED ORDER — POTASSIUM CHLORIDE 10 MEQ/100ML IV SOLN
10.0000 meq | Freq: Once | INTRAVENOUS | Status: DC
  Administered 2011-08-21: 10 meq via INTRAVENOUS
  Filled 2011-08-21: qty 100

## 2011-08-21 MED ORDER — SODIUM CHLORIDE 0.9 % IV SOLN
INTRAVENOUS | Status: DC
  Filled 2011-08-21: qty 1

## 2011-08-21 MED ORDER — DEXTROSE 50 % IV SOLN: 25.0000 mL | INTRAVENOUS | Status: DC | PRN

## 2011-08-21 MED ORDER — SODIUM CHLORIDE 0.9 % IV SOLN
INTRAVENOUS | Status: AC
  Administered 2011-08-21: 2.9 [IU]/h via INTRAVENOUS
  Filled 2011-08-21: qty 1

## 2011-08-21 MED ORDER — SODIUM CHLORIDE 0.9 % IV SOLN
INTRAVENOUS | Status: DC
  Administered 2011-08-21 – 2011-08-22 (×4): via INTRAVENOUS

## 2011-08-21 MED ORDER — INSULIN GLARGINE 100 UNIT/ML ~~LOC~~ SOLN
10.0000 [IU] | Freq: Every day | SUBCUTANEOUS | Status: DC
  Administered 2011-08-21: 10 [IU] via SUBCUTANEOUS
  Filled 2011-08-21: qty 3

## 2011-08-21 MED ORDER — SODIUM CHLORIDE 0.9 % IV SOLN: INTRAVENOUS | Status: DC

## 2011-08-21 MED ORDER — METRONIDAZOLE 500 MG PO TABS
500.0000 mg | ORAL_TABLET | Freq: Three times a day (TID) | ORAL | Status: DC
  Administered 2011-08-21 (×2): 500 mg via ORAL
  Filled 2011-08-21 (×6): qty 1

## 2011-08-21 MED ORDER — INSULIN ASPART 100 UNIT/ML ~~LOC~~ SOLN
0.0000 [IU] | Freq: Every day | SUBCUTANEOUS | Status: DC
  Administered 2011-08-21: 2 [IU] via SUBCUTANEOUS

## 2011-08-21 MED ORDER — LEVOTHYROXINE SODIUM 75 MCG PO TABS
75.0000 ug | ORAL_TABLET | Freq: Every day | ORAL | Status: DC
  Filled 2011-08-21 (×2): qty 1

## 2011-08-21 MED ORDER — ONDANSETRON HCL 4 MG/2ML IJ SOLN
4.0000 mg | Freq: Once | INTRAMUSCULAR | Status: AC
  Administered 2011-08-21: 4 mg via INTRAVENOUS
  Filled 2011-08-21: qty 2

## 2011-08-21 MED ORDER — DEXTROSE-NACL 5-0.45 % IV SOLN
INTRAVENOUS | Status: DC
  Administered 2011-08-21: 08:00:00 via INTRAVENOUS

## 2011-08-21 MED ORDER — MORPHINE SULFATE 4 MG/ML IJ SOLN
4.0000 mg | Freq: Once | INTRAMUSCULAR | Status: AC
  Administered 2011-08-21: 4 mg via INTRAVENOUS
  Filled 2011-08-21: qty 1

## 2011-08-21 MED ORDER — SODIUM CHLORIDE 0.9 % IV SOLN
1000.0000 mL | INTRAVENOUS | Status: DC
  Administered 2011-08-21 (×2): 1000 mL via INTRAVENOUS

## 2011-08-21 MED ORDER — INSULIN ASPART 100 UNIT/ML ~~LOC~~ SOLN
3.0000 [IU] | Freq: Three times a day (TID) | SUBCUTANEOUS | Status: DC
  Administered 2011-08-21 – 2011-08-22 (×4): 3 [IU] via SUBCUTANEOUS

## 2011-08-21 MED ORDER — DEXTROSE 5 % IV SOLN
1.0000 g | Freq: Once | INTRAVENOUS | Status: AC
  Administered 2011-08-21: 1 g via INTRAVENOUS
  Filled 2011-08-21: qty 10

## 2011-08-21 MED ORDER — SODIUM CHLORIDE 0.9 % IV SOLN: INTRAVENOUS | Status: AC

## 2011-08-21 MED ORDER — SODIUM CHLORIDE 0.9 % IV SOLN
1000.0000 mL | Freq: Once | INTRAVENOUS | Status: AC
  Administered 2011-08-21: 1000 mL via INTRAVENOUS

## 2011-08-21 MED ORDER — DEXTROSE-NACL 5-0.45 % IV SOLN: INTRAVENOUS | Status: DC

## 2011-08-21 MED ORDER — INSULIN ASPART 100 UNIT/ML ~~LOC~~ SOLN
0.0000 [IU] | Freq: Three times a day (TID) | SUBCUTANEOUS | Status: DC
  Administered 2011-08-21: 3 [IU] via SUBCUTANEOUS
  Administered 2011-08-21: 2 [IU] via SUBCUTANEOUS
  Administered 2011-08-22 (×2): 3 [IU] via SUBCUTANEOUS
  Filled 2011-08-21: qty 3

## 2011-08-21 MED ORDER — METRONIDAZOLE 500 MG PO TABS: 500.0000 mg | ORAL_TABLET | Freq: Three times a day (TID) | ORAL | Status: DC

## 2011-08-21 MED ORDER — ONDANSETRON HCL 4 MG/2ML IJ SOLN
4.0000 mg | Freq: Three times a day (TID) | INTRAMUSCULAR | Status: DC | PRN
  Filled 2011-08-21: qty 2

## 2011-08-21 MED ORDER — DEXTROSE 5 % IV SOLN
1.0000 g | INTRAVENOUS | Status: DC
  Administered 2011-08-22: 1 g via INTRAVENOUS
  Filled 2011-08-21 (×2): qty 10

## 2011-08-21 MED ORDER — SODIUM CHLORIDE 0.9 % IV BOLUS (SEPSIS)
1000.0000 mL | Freq: Once | INTRAVENOUS | Status: AC
  Administered 2011-08-21: 1000 mL via INTRAVENOUS

## 2011-08-21 NOTE — ED Notes (Signed)
Patient transported to X-ray 

## 2011-08-21 NOTE — Progress Notes (Signed)
Utilization review completed. [name] Ross 08/21/2011 

## 2011-08-21 NOTE — ED Notes (Signed)
Introduced self to patient.  Patient informed that we are waiting on ultrasound at this time.  No other complaints.  Patient is alert and oriented.

## 2011-08-21 NOTE — ED Notes (Signed)
Patient with abdominal pain for over a week.  Patient states she is nauseated. Patient states she had an elective AB on Feb 5.  Pain is all over abdomen.  Patient is spotting.

## 2011-08-21 NOTE — ED Provider Notes (Signed)
History     CSN: [id number]  Arrival date & time 08/20/11  2302   First MD Initiated Contact with Patient 08/21/11 [phone]      Chief Complaint  Patient presents with  . Abdominal Pain     HPI  History provided by the patient. Patient is a 26 year old female with history of diabetes presents with complaints of diffuse abdominal pains and cramps for the past 3 days. Patient also reports having associated decreased appetite with nausea symptoms and occasional vomiting on Saturday. Symptoms are described as a cramping feeling in his moderate severe. Patient also reports having an elective surgical abortion on February 5 in [place]. She has had some vaginal bleeding and spotting since that time has decreased. She denies any other complications following the procedure. Patient denies any vaginal discharge. Patient patient denies any fever, chills, sweats.  denies any dysuria, hematuria, urinary frequency or flank pain. she denies any diarrhea constipation. Patient did try taking some Aleve without significant improvements. She denies any other aggravating or alleviating factors.   Past Medical History  Diagnosis Date  . Diabetes mellitus     History reviewed. No pertinent past surgical history.  No family history on file.  History  Substance Use Topics  . Smoking status: Never Smoker   . Smokeless tobacco: Not on file  . Alcohol Use: No    OB History    Grav Para Term Preterm Abortions TAB SAB Ect Mult Living                  Review of Systems  Constitutional: Positive for appetite change. Negative for fever and chills.  Respiratory: Negative for shortness of breath.   Cardiovascular: Negative for chest pain.  Gastrointestinal: Positive for nausea, vomiting and abdominal pain. Negative for diarrhea and constipation.  Genitourinary: Positive for vaginal bleeding. Negative for dysuria, frequency, hematuria, flank pain and vaginal discharge.  All other systems reviewed and  are negative.    Allergies  Review of patient's allergies indicates no known allergies.  Home Medications   Current Outpatient Rx  Name Route Sig Dispense Refill  . INSULIN ASPART 100 UNIT/ML [place] SOLN Subcutaneous Inject 2-12 Units into the skin 3 (three) times daily before meals. Per sliding scale    . INSULIN GLARGINE 100 UNIT/ML [place] SOLN Subcutaneous Inject 10 Units into the skin 2 (two) times daily.    Marland Kitchen LEVOTHYROXINE SODIUM 75 MCG PO TABS Oral Take 75 mcg by mouth daily.    Marland Kitchen CIPROFLOXACIN HCL 250 MG PO TABS Oral Take 1 tablet (250 mg total) by mouth every 12 (twelve) hours. 6 tablet 0    BP 133/87  Temp(Src) 98.1 F (36.7 C) (Oral)  Resp 18  SpO2 100%  Physical Exam  Nursing note and vitals reviewed. Constitutional: She is oriented to person, place, and time. She appears well-developed and well-nourished. No distress.  HENT:  Head: Normocephalic.  Cardiovascular: Normal rate and regular rhythm.   Pulmonary/Chest: Effort normal and breath sounds normal. No respiratory distress. She has no wheezes. She has no rales.  Abdominal: Soft. She exhibits no distension. There is no rigidity, no rebound, no guarding, no CVA tenderness, no tenderness at McBurney's point and negative Murphy's sign.       Diffuse mild abdominal tenderness greatest in lower abdomen.  Genitourinary: Vagina normal. Cervix exhibits no motion tenderness, no discharge and no friability. Right adnexum displays no mass, no tenderness and no fullness. Left adnexum displays no mass and no fullness.  Chaperone was present. Small amounts of old blood present.  Neurological: She is alert and oriented to person, place, and time.  Skin: Skin is warm and dry. No rash noted.  Psychiatric: She has a normal mood and affect. Her behavior is normal.    ED Course  Procedures   Results for orders placed during the hospital encounter of 08/20/11  URINALYSIS, ROUTINE W REFLEX MICROSCOPIC      Component Value Range    Color, Urine YELLOW  YELLOW    APPearance CLOUDY (*) CLEAR    Specific Gravity, Urine 1.028  1.005 - 1.030    pH 6.0  5.0 - 8.0    Glucose, UA >1000 (*) NEGATIVE (mg/dL)   Hgb urine dipstick MODERATE (*) NEGATIVE    Bilirubin Urine NEGATIVE  NEGATIVE    Ketones, ur >80 (*) NEGATIVE (mg/dL)   Protein, ur 30 (*) NEGATIVE (mg/dL)   Urobilinogen, UA 0.2  0.0 - 1.0 (mg/dL)   Nitrite NEGATIVE  NEGATIVE    Leukocytes, UA MODERATE (*) NEGATIVE   CBC      Component Value Range   WBC 5.9  4.0 - 10.5 (K/uL)   RBC 4.07  3.87 - 5.11 (MIL/uL)   Hemoglobin 12.6  12.0 - 15.0 (g/dL)   HCT 46.9  62.9 - 52.8 (%)   MCV 91.4  78.0 - 100.0 (fL)   MCH 31.0  26.0 - 34.0 (pg)   MCHC 33.9  30.0 - 36.0 (g/dL)   RDW 41.3 (*) 24.4 - 15.5 (%)   Platelets 321  150 - 400 (K/uL)  DIFFERENTIAL      Component Value Range   Neutrophils Relative 60  43 - 77 (%)   Neutro Abs 3.6  1.7 - 7.7 (K/uL)   Lymphocytes Relative 31  12 - 46 (%)   Lymphs Abs 1.8  0.7 - 4.0 (K/uL)   Monocytes Relative 7  3 - 12 (%)   Monocytes Absolute 0.4  0.1 - 1.0 (K/uL)   Eosinophils Relative 1  0 - 5 (%)   Eosinophils Absolute 0.0  0.0 - 0.7 (K/uL)   Basophils Relative 1  0 - 1 (%)   Basophils Absolute 0.1  0.0 - 0.1 (K/uL)  BASIC METABOLIC PANEL      Component Value Range   Sodium 134 (*) 135 - 145 (mEq/L)   Potassium 3.9  3.5 - 5.1 (mEq/L)   Chloride 99  96 - 112 (mEq/L)   CO2 18 (*) 19 - 32 (mEq/L)   Glucose, Bld 313 (*) 70 - 99 (mg/dL)   BUN 16  6 - 23 (mg/dL)   Creatinine, Ser 0.10  0.50 - 1.10 (mg/dL)   Calcium 9.7  8.4 - 27.2 (mg/dL)   GFR calc non Af Amer 86 (*) >90 (mL/min)   GFR calc Af Amer >90  >90 (mL/min)  POCT PREGNANCY, URINE      Component Value Range   Preg Test, Ur POSITIVE (*) NEGATIVE   URINE MICROSCOPIC-ADD ON      Component Value Range   Squamous Epithelial / LPF RARE  RARE    WBC, UA TOO NUMEROUS TO COUNT  <3 (WBC/hpf)   RBC / HPF 11-20  <3 (RBC/hpf)   Bacteria, UA FEW (*) RARE    Casts HYALINE  CASTS (*) NEGATIVE   HCG, QUANTITATIVE, PREGNANCY      Component Value Range   hCG, Beta Chain, Quant, S 61 (*) <5 (mIU/mL)  GLUCOSE, CAPILLARY      Component Value  Range   Glucose-Capillary 323 (*) 70 - 99 (mg/dL)   Comment 1 Documented in Chart     Comment 2 Notify RN      Anion gap of 17   US Ob Comp Less 14 Wks  08/21/2011  *RADIOLOGY REPORT*  Clinical Data: Pain and bleeding, reported abortion on 07/24/2011; quantitative beta HCG 61  OBSTETRIC <14 WK [place] AND TRANSVAGINAL OB US  Technique:  Both transabdominal and transvaginal ultrasound examinations were performed for complete evaluation of the gestation as well as the maternal uterus, adnexal regions, and pelvic cul-de-sac.  Transvaginal technique was performed to assess early pregnancy.  Comparison:  None  Intrauterine gestational sac:  Not identified Yolk sac: Not identified Embryo: Not identified Cardiac Activity: Not identified Heart Rate: N/A bpm  Maternal uterus/adnexae: Mildly heterogeneous endometrial complex 6 mm thick. Right ovary normal size and morphology, 1.7 x 1.9 x 3.4 cm. Left ovary normal size morphology, 2.7 x 1.8 x 3.5 cm. Trace free pelvic fluid at cul-de-sac. No adnexal masses.  IMPRESSION: No intrauterine gestation identified. Observed findings could represent prior spontaneous or therapeutic abortion, if an intrauterine pregnancy was previously confirmed. In the absence of prior confirmation of an IUP, differential diagnosis includes spontaneous or therapeutic abortion, early intrauterine pregnancy too early to visualize, and ectopic pregnancy. Serial quantitative follow-up Beta HCG and/or ultrasound recommended to definitively exclude ectopic pregnancy.  Original Report Authenticated By: Lollie Marrow, M.D.   US Ob Transvaginal  08/21/2011  *RADIOLOGY REPORT*  Clinical Data: Pain and bleeding, reported abortion on 07/24/2011; quantitative beta HCG 61  OBSTETRIC <14 WK [place] AND TRANSVAGINAL OB US  Technique:  Both  transabdominal and transvaginal ultrasound examinations were performed for complete evaluation of the gestation as well as the maternal uterus, adnexal regions, and pelvic cul-de-sac.  Transvaginal technique was performed to assess early pregnancy.  Comparison:  None  Intrauterine gestational sac:  Not identified Yolk sac: Not identified Embryo: Not identified Cardiac Activity: Not identified Heart Rate: N/A bpm  Maternal uterus/adnexae: Mildly heterogeneous endometrial complex 6 mm thick. Right ovary normal size and morphology, 1.7 x 1.9 x 3.4 cm. Left ovary normal size morphology, 2.7 x 1.8 x 3.5 cm. Trace free pelvic fluid at cul-de-sac. No adnexal masses.  IMPRESSION: No intrauterine gestation identified. Observed findings could represent prior spontaneous or therapeutic abortion, if an intrauterine pregnancy was previously confirmed. In the absence of prior confirmation of an IUP, differential diagnosis includes spontaneous or therapeutic abortion, early intrauterine pregnancy too early to visualize, and ectopic pregnancy. Serial quantitative follow-up Beta HCG and/or ultrasound recommended to definitively exclude ectopic pregnancy.  Original Report Authenticated By: Lollie Marrow, M.D.     1. DKA (diabetic ketoacidoses)       MDM  2:00 AM patient seen and evaluated. Patient in no acute distress. Patient with elevated blood sugar and anion gap of 17.   Patient was seen and discussed with attending physician. Patient with concerning symptoms for DKA. Abdominal pain thought to be related to DKA symptoms. Dr. Juleen China has spoken with [facility] hospitalist for admission. They would like hCG Quant and pelvic ultrasound performed to rule out other concerning cause for abdominal pain.     Angus Seller, [place] 08/21/11 [phone]

## 2011-08-21 NOTE — ED Notes (Signed)
Report called, pt ready for transport upstairs.

## 2011-08-21 NOTE — H&P (Signed)
[name] Ross is an 26 y.o. female.   PCP - Dr.Eva Clelia Croft. Chief Complaint: Abdominal pain with nausea vomiting. HPI: 26 year old female with history of diabetes mellitus type 1, hypothyroidism presented the ER because of nausea vomiting and abdominal pain over the last 2-3 days. Patient states that the abdominal pain started off first with his being diffuse sustained which patient has been having persistent nausea vomiting and unable to take anything in. In the ER patient was found to have a high blood sugar with mild anion gap. In addition urine analysis is compatible with UTI. Patient has had a recent abortion done one month ago. And her pregnancy screen at this time is positive but beta-hCG levels show is very low and sonogram of the pelvis does not show any intrauterine pregnancy. Patient had come last week to the ER because of hypoglycemia and had a seizure at that time. And at that time patient also was prescribed ciprofloxacin for UTI. Patient states that she did take the whole 3 days course of Cipro. Patient denies any diarrhea. Patient denies any chest pain shortness breath dizziness or loss of consciousness.  Past Medical History  Diagnosis Date  . Diabetes mellitus   . Hypothyroidism     Past Surgical History  Procedure Date  . Induced abortion     History reviewed. No pertinent family history. Social History:  reports that she has never smoked. She does not have any smokeless tobacco history on file. She reports that she does not drink alcohol or use illicit drugs.  Allergies: No Known Allergies  Medications Prior to Admission  Medication Dose Route Frequency Provider Last Rate Last Dose  . 0.9 %  sodium chloride infusion  1,000 mL Intravenous Once Raeford Razor, MD 1,000 mL/hr at 08/21/11 0525 1,000 mL at 08/21/11 0525  . 0.9 %  sodium chloride infusion  1,000 mL Intravenous Continuous Raeford Razor, MD      . 0.9 %  sodium chloride infusion   Intravenous Continuous  Raeford Razor, MD      . 0.9 %  sodium chloride infusion   Intravenous STAT Raeford Razor, MD      . cefTRIAXone (ROCEPHIN) 1 g in dextrose 5 % 50 mL IVPB  1 g Intravenous Once Raeford Razor, MD   1 g at 08/21/11 0453  . dextrose 5 %-0.45 % sodium chloride infusion   Intravenous Continuous Raeford Razor, MD      . insulin regular (NOVOLIN R,HUMULIN R) 1 Units/mL in sodium chloride 0.9 % 100 mL infusion   Intravenous To Major Raeford Razor, MD 2.9 mL/hr at 08/21/11 0528 2.9 Units/hr at 08/21/11 0528  . morphine 4 MG/ML injection 4 mg  4 mg Intravenous Once Angus Seller, PA   4 mg at 08/21/11 4098  . morphine 4 MG/ML injection 4 mg  4 mg Intravenous Once Angus Seller, PA   4 mg at 08/21/11 0446  . ondansetron (ZOFRAN) injection 4 mg  4 mg Intravenous Once Angus Seller, PA   4 mg at 08/21/11 0211  . ondansetron (ZOFRAN) injection 4 mg  4 mg Intravenous Once Angus Seller, PA   4 mg at 08/21/11 0446  . ondansetron (ZOFRAN) injection 4 mg  4 mg Intravenous Q8H PRN Raeford Razor, MD      . potassium chloride 10 mEq in 100 mL IVPB  10 mEq Intravenous Once Raeford Razor, MD      . sodium chloride 0.9 % bolus 1,000 mL  1,000  mL Intravenous Once Angus Seller, PA   1,000 mL at 08/21/11 0208  . DISCONTD: insulin regular (NOVOLIN R,HUMULIN R) 1 Units/mL in sodium chloride 0.9 % 100 mL infusion   Intravenous Continuous Raeford Razor, MD       Medications Prior to Admission  Medication Sig Dispense Refill  . insulin aspart (NOVOLOG) 100 UNIT/ML injection Inject 2-12 Units into the skin 3 (three) times daily before meals. Per sliding scale      . insulin glargine (LANTUS) 100 UNIT/ML injection Inject 10 Units into the skin 2 (two) times daily.      Marland Kitchen levothyroxine (SYNTHROID, LEVOTHROID) 75 MCG tablet Take 75 mcg by mouth daily.      . ciprofloxacin (CIPRO) 250 MG tablet Take 1 tablet (250 mg total) by mouth every 12 (twelve) hours.  6 tablet  0    Results for orders placed during the hospital  encounter of 08/20/11 (from the past 48 hour(s))  CBC     Status: Abnormal   Collection Time   08/20/11 11:53 PM      Component Value Range Comment   WBC 5.9  4.0 - 10.5 (K/uL)    RBC 4.07  3.87 - 5.11 (MIL/uL)    Hemoglobin 12.6  12.0 - 15.0 (g/dL)    HCT 45.4  09.8 - 11.9 (%)    MCV 91.4  78.0 - 100.0 (fL)    MCH 31.0  26.0 - 34.0 (pg)    MCHC 33.9  30.0 - 36.0 (g/dL)    RDW 14.7 (*) 82.9 - 15.5 (%)    Platelets 321  150 - 400 (K/uL)   DIFFERENTIAL     Status: Normal   Collection Time   08/20/11 11:53 PM      Component Value Range Comment   Neutrophils Relative 60  43 - 77 (%)    Neutro Abs 3.6  1.7 - 7.7 (K/uL)    Lymphocytes Relative 31  12 - 46 (%)    Lymphs Abs 1.8  0.7 - 4.0 (K/uL)    Monocytes Relative 7  3 - 12 (%)    Monocytes Absolute 0.4  0.1 - 1.0 (K/uL)    Eosinophils Relative 1  0 - 5 (%)    Eosinophils Absolute 0.0  0.0 - 0.7 (K/uL)    Basophils Relative 1  0 - 1 (%)    Basophils Absolute 0.1  0.0 - 0.1 (K/uL)   BASIC METABOLIC PANEL     Status: Abnormal   Collection Time   08/20/11 11:53 PM      Component Value Range Comment   Sodium 134 (*) 135 - 145 (mEq/L)    Potassium 3.9  3.5 - 5.1 (mEq/L)    Chloride 99  96 - 112 (mEq/L)    CO2 18 (*) 19 - 32 (mEq/L)    Glucose, Bld 313 (*) 70 - 99 (mg/dL)    BUN 16  6 - 23 (mg/dL)    Creatinine, Ser 5.62  0.50 - 1.10 (mg/dL)    Calcium 9.7  8.4 - 10.5 (mg/dL)    GFR calc non Af Amer 86 (*) >90 (mL/min)    GFR calc Af Amer >90  >90 (mL/min)   URINALYSIS, ROUTINE W REFLEX MICROSCOPIC     Status: Abnormal   Collection Time   08/21/11  2:03 AM      Component Value Range Comment   Color, Urine YELLOW  YELLOW     APPearance CLOUDY (*) CLEAR     Specific  Gravity, Urine 1.028  1.005 - 1.030     pH 6.0  5.0 - 8.0     Glucose, UA >1000 (*) NEGATIVE (mg/dL)    Hgb urine dipstick MODERATE (*) NEGATIVE     Bilirubin Urine NEGATIVE  NEGATIVE     Ketones, ur >80 (*) NEGATIVE (mg/dL)    Protein, ur 30 (*) NEGATIVE (mg/dL)     Urobilinogen, UA 0.2  0.0 - 1.0 (mg/dL)    Nitrite NEGATIVE  NEGATIVE     Leukocytes, UA MODERATE (*) NEGATIVE    URINE MICROSCOPIC-ADD ON     Status: Abnormal   Collection Time   08/21/11  2:03 AM      Component Value Range Comment   Squamous Epithelial / LPF RARE  RARE     WBC, UA TOO NUMEROUS TO COUNT  <3 (WBC/hpf)    RBC / HPF 11-20  <3 (RBC/hpf)    Bacteria, UA FEW (*) RARE     Casts HYALINE CASTS (*) NEGATIVE    POCT PREGNANCY, URINE     Status: Abnormal   Collection Time   08/21/11  2:04 AM      Component Value Range Comment   Preg Test, Ur POSITIVE (*) NEGATIVE    HCG, QUANTITATIVE, PREGNANCY     Status: Abnormal   Collection Time   08/21/11  3:03 AM      Component Value Range Comment   hCG, Beta Chain, Quant, S 61 (*) <5 (mIU/mL)   GLUCOSE, CAPILLARY     Status: Abnormal   Collection Time   08/21/11  4:13 AM      Component Value Range Comment   Glucose-Capillary 323 (*) 70 - 99 (mg/dL)    Comment 1 Documented in Chart      Comment 2 Notify RN     GLUCOSE, CAPILLARY     Status: Abnormal   Collection Time   08/21/11  5:15 AM      Component Value Range Comment   Glucose-Capillary 347 (*) 70 - 99 (mg/dL)    US Ob Comp Less 14 Wks  08/21/2011  *RADIOLOGY REPORT*  Clinical Data: Pain and bleeding, reported abortion on 07/24/2011; quantitative beta HCG 61  OBSTETRIC <14 WK [place] AND TRANSVAGINAL OB US  Technique:  Both transabdominal and transvaginal ultrasound examinations were performed for complete evaluation of the gestation as well as the maternal uterus, adnexal regions, and pelvic cul-de-sac.  Transvaginal technique was performed to assess early pregnancy.  Comparison:  None  Intrauterine gestational sac:  Not identified Yolk sac: Not identified Embryo: Not identified Cardiac Activity: Not identified Heart Rate: N/A bpm  Maternal uterus/adnexae: Mildly heterogeneous endometrial complex 6 mm thick. Right ovary normal size and morphology, 1.7 x 1.9 x 3.4 cm. Left ovary normal size  morphology, 2.7 x 1.8 x 3.5 cm. Trace free pelvic fluid at cul-de-sac. No adnexal masses.  IMPRESSION: No intrauterine gestation identified. Observed findings could represent prior spontaneous or therapeutic abortion, if an intrauterine pregnancy was previously confirmed. In the absence of prior confirmation of an IUP, differential diagnosis includes spontaneous or therapeutic abortion, early intrauterine pregnancy too early to visualize, and ectopic pregnancy. Serial quantitative follow-up Beta HCG and/or ultrasound recommended to definitively exclude ectopic pregnancy.  Original Report Authenticated By: Lollie Marrow, M.D.   US Ob Transvaginal  08/21/2011  *RADIOLOGY REPORT*  Clinical Data: Pain and bleeding, reported abortion on 07/24/2011; quantitative beta HCG 61  OBSTETRIC <14 WK [place] AND TRANSVAGINAL OB US  Technique:  Both transabdominal and  transvaginal ultrasound examinations were performed for complete evaluation of the gestation as well as the maternal uterus, adnexal regions, and pelvic cul-de-sac.  Transvaginal technique was performed to assess early pregnancy.  Comparison:  None  Intrauterine gestational sac:  Not identified Yolk sac: Not identified Embryo: Not identified Cardiac Activity: Not identified Heart Rate: N/A bpm  Maternal uterus/adnexae: Mildly heterogeneous endometrial complex 6 mm thick. Right ovary normal size and morphology, 1.7 x 1.9 x 3.4 cm. Left ovary normal size morphology, 2.7 x 1.8 x 3.5 cm. Trace free pelvic fluid at cul-de-sac. No adnexal masses.  IMPRESSION: No intrauterine gestation identified. Observed findings could represent prior spontaneous or therapeutic abortion, if an intrauterine pregnancy was previously confirmed. In the absence of prior confirmation of an IUP, differential diagnosis includes spontaneous or therapeutic abortion, early intrauterine pregnancy too early to visualize, and ectopic pregnancy. Serial quantitative follow-up Beta HCG and/or ultrasound  recommended to definitively exclude ectopic pregnancy.  Original Report Authenticated By: Lollie Marrow, M.D.    Review of Systems  Constitutional: Negative.   HENT: Negative.   Eyes: Negative.   Respiratory: Negative.   Cardiovascular: Negative.   Gastrointestinal: Positive for nausea, vomiting and abdominal pain.  Genitourinary: Negative.   Musculoskeletal: Negative.   Skin: Negative.   Neurological: Negative.   Endo/Heme/Allergies: Negative.   Psychiatric/Behavioral: Negative.     Blood pressure 135/92, pulse 96, temperature 98.4 F (36.9 C), temperature source Oral, resp. rate 15, SpO2 100.00%. Physical Exam  Constitutional: She is oriented to person, place, and time. She appears well-developed and well-nourished. No distress.  HENT:  Head: Normocephalic and atraumatic.  Right Ear: External ear normal.  Left Ear: External ear normal.  Nose: Nose normal.  Mouth/Throat: Oropharynx is clear and moist. No oropharyngeal exudate.  Eyes: Conjunctivae are normal. Pupils are equal, round, and reactive to light. Right eye exhibits no discharge. Left eye exhibits no discharge.  Neck: Normal range of motion. Neck supple.  Cardiovascular:       Sinus tachycardia.  Respiratory: Effort normal and breath sounds normal. No respiratory distress. She has no wheezes. She has no rales.  GI: Soft. Bowel sounds are normal. She exhibits no distension. There is no tenderness. There is no rebound.  Musculoskeletal: Normal range of motion. She exhibits no edema and no tenderness.  Neurological: She is alert and oriented to person, place, and time.       Moves all extremities.  Skin: Skin is warm and dry. No rash noted. She is not diaphoretic. No erythema.  Psychiatric: Her behavior is normal.     Assessment/Plan #1. Diabetic ketoacidosis in a patient with known history of diabetes mellitus type 1 - I think her urinary tract infection might have precipitated the diabetic ketoacidosis. Patient has  been some IV insulin DKA protocol along with IV fluids which we will continue until her anion gap is corrected. Have frequent metabolic panel checks. #2. Abdominal pain and urine tract infection  - patient's abdominal pain may be from the urine tract infection and nausea vomiting from DKA. At this time we have started on ceftriaxone which we will continue along with a follow urine cultures. At this time her abdomen appears benign on exam. We will get an abdominal series. We will check lipase levels. If her abdominal pain is persistent may need to get a CAT scan of the abdomen and pelvis. #3. Recent abortion #4. History of hypothyroidism - check TSH and continue Synthroid. #5. Recent visit to the ER with hypoglycemic spells -  patient states that when she was pregnant she was taking Lantus twice a day which she did not decrease. Last 3 days she has decreased the dose of Lantus. We need to closely observe for hypoglycemia.  CODE STATUS - full code.    [name] Boissonneault N. 08/21/2011, 6:00 AM

## 2011-08-21 NOTE — ED Notes (Signed)
Called flow manager to inquire about bed assignment.  Advised patient would be given a room on 3700 at 0700.

## 2011-08-21 NOTE — ED Notes (Signed)
Phlebotomy called for labs 

## 2011-08-21 NOTE — ED Notes (Signed)
Failed IV attempt x 2, in left ac and right posterior forearm.

## 2011-08-21 NOTE — ED Notes (Signed)
Spoke with ultrasound who said they would be coming to do pelvis ultrasound shortly.

## 2011-08-21 NOTE — Progress Notes (Signed)
Subjective: -Patient wants to go home. -She relates she feels better. -  Objective: Filed Vitals:   08/21/11 0255 08/21/11 0415 08/21/11 0600 08/21/11 0942  BP:  131/93 127/81 122/81  Pulse:  93 86 81  Temp: 98.4 F (36.9 C)   98.2 F (36.8 C)  TempSrc: Oral   Oral  Resp:  16 16 18   Height:    5\' 2"  (1.575 m)  Weight:    49.5 kg (109 lb 2 oz)  SpO2:  100% 100% 100%   Weight change:   Intake/Output Summary (Last 24 hours) at 08/21/11 1132 Last data filed at 08/21/11 0525  Gross per 24 hour  Intake   1000 ml  Output      0 ml  Net   1000 ml    General: Alert, awake, oriented x3, in no acute distress.  HEENT: No bruits, no goiter.  Heart: Regular rate and rhythm, without murmurs, rubs, gallops.  Lungs: Crackles left side, bilateral air movement.  Abdomen: Soft,tender suprapubic with palaption. Neuro: Grossly intact, nonfocal.   Lab Results:  Basename 08/21/11 0758 08/21/11 0645 08/20/11 2353  NA 141 137 134*  K 3.7 4.0 3.9  CL 110 106 99  CO2 22 21 18*  GLUCOSE 175* 257* 313*  BUN 15 15 16   CREATININE 0.96 0.92 0.92  CALCIUM 8.5 8.3* 9.7  MG -- -- --  PHOS -- -- --   No results found for this basename: AST:2,ALT:2,ALKPHOS:2,BILITOT:2,PROT:2,ALBUMIN:2 in the last 72 hours  Basename 08/21/11 0758  LIPASE 16  AMYLASE --    Basename 08/21/11 0758 08/20/11 2353  WBC 6.5 5.9  NEUTROABS -- 3.6  HGB 11.3* 12.6  HCT 33.6* 37.2  MCV 91.3 91.4  PLT 295 321    Basename 08/21/11 0758  CKTOTAL 30  CKMB 1.8  CKMBINDEX --  TROPONINI <0.30   No components found with this basename: POCBNP:3 No results found for this basename: DDIMER:2 in the last 72 hours No results found for this basename: HGBA1C:2 in the last 72 hours No results found for this basename: CHOL:2,HDL:2,LDLCALC:2,TRIG:2,CHOLHDL:2,LDLDIRECT:2 in the last 72 hours No results found for this basename: TSH,T4TOTAL,FREET3,T3FREE,THYROIDAB in the last 72 hours No results found for this basename:  VITAMINB12:2,FOLATE:2,FERRITIN:2,TIBC:2,IRON:2,RETICCTPCT:2 in the last 72 hours  Micro Results: Recent Results (from the past 240 hour(s))  WET PREP, GENITAL     Status: Abnormal   Collection Time   08/21/11  5:48 AM      Component Value Range Status Comment   Yeast Wet Prep HPF POC FEW (*) NONE SEEN  Final    Trich, Wet Prep NONE SEEN  NONE SEEN  Final    Clue Cells Wet Prep HPF POC FEW (*) NONE SEEN  Final    WBC, Wet Prep HPF POC FEW (*) NONE SEEN  Final     Studies/Results: US Ob Comp Less 14 Wks  08/21/2011  *RADIOLOGY REPORT*  Clinical Data: Pain and bleeding, reported abortion on 07/24/2011; quantitative beta HCG 61  OBSTETRIC <14 WK [place] AND TRANSVAGINAL OB US  Technique:  Both transabdominal and transvaginal ultrasound examinations were performed for complete evaluation of the gestation as well as the maternal uterus, adnexal regions, and pelvic cul-de-sac.  Transvaginal technique was performed to assess early pregnancy.  Comparison:  None  Intrauterine gestational sac:  Not identified Yolk sac: Not identified Embryo: Not identified Cardiac Activity: Not identified Heart Rate: N/A bpm  Maternal uterus/adnexae: Mildly heterogeneous endometrial complex 6 mm thick. Right ovary normal size and morphology, 1.7  x 1.9 x 3.4 cm. Left ovary normal size morphology, 2.7 x 1.8 x 3.5 cm. Trace free pelvic fluid at cul-de-sac. No adnexal masses.  IMPRESSION: No intrauterine gestation identified. Observed findings could represent prior spontaneous or therapeutic abortion, if an intrauterine pregnancy was previously confirmed. In the absence of prior confirmation of an IUP, differential diagnosis includes spontaneous or therapeutic abortion, early intrauterine pregnancy too early to visualize, and ectopic pregnancy. Serial quantitative follow-up Beta HCG and/or ultrasound recommended to definitively exclude ectopic pregnancy.  Original Report Authenticated By: Lollie Marrow, M.D.   US Ob  Transvaginal  08/21/2011  *RADIOLOGY REPORT*  Clinical Data: Pain and bleeding, reported abortion on 07/24/2011; quantitative beta HCG 61  OBSTETRIC <14 WK [place] AND TRANSVAGINAL OB US  Technique:  Both transabdominal and transvaginal ultrasound examinations were performed for complete evaluation of the gestation as well as the maternal uterus, adnexal regions, and pelvic cul-de-sac.  Transvaginal technique was performed to assess early pregnancy.  Comparison:  None  Intrauterine gestational sac:  Not identified Yolk sac: Not identified Embryo: Not identified Cardiac Activity: Not identified Heart Rate: N/A bpm  Maternal uterus/adnexae: Mildly heterogeneous endometrial complex 6 mm thick. Right ovary normal size and morphology, 1.7 x 1.9 x 3.4 cm. Left ovary normal size morphology, 2.7 x 1.8 x 3.5 cm. Trace free pelvic fluid at cul-de-sac. No adnexal masses.  IMPRESSION: No intrauterine gestation identified. Observed findings could represent prior spontaneous or therapeutic abortion, if an intrauterine pregnancy was previously confirmed. In the absence of prior confirmation of an IUP, differential diagnosis includes spontaneous or therapeutic abortion, early intrauterine pregnancy too early to visualize, and ectopic pregnancy. Serial quantitative follow-up Beta HCG and/or ultrasound recommended to definitively exclude ectopic pregnancy.  Original Report Authenticated By: Lollie Marrow, M.D.   Dg Abd Acute W/chest  08/21/2011  *RADIOLOGY REPORT*  Clinical Data: Abdominal pain.  Nausea and vomiting.  ACUTE ABDOMEN SERIES (ABDOMEN 2 VIEW & CHEST 1 VIEW)  Comparison: None.  Findings: Mild central pulmonary vascular prominence.  Lungs clear. Mild thoracic scoliosis.  Heart size within normal limits.  Gas and fluid filled stomach.  No plain film evidence of bowel obstruction or free intraperitoneal air.  IMPRESSION: No plain film evidence of bowel obstruction or free intraperitoneal air.  Original Report Authenticated By:  Fuller Canada, M.D.    Medications: I have reviewed the patient's current medications.  Assessment and plan: Principal Problem:  *DKA, type 1: Off insulin drip. Start lantus plus SSI. Most likely cause is her UTI.  2. UTI (lower urinary tract infection)  -rocephin afebrile WBC trending down. Urine culture.  3.Hypothyroidism: -Check TSH. -check B12. -she is not macrocytotic, but her RDW is high. Which can be the beging of macrocytosis or a combination of iron def. And iron deficiency. Ferritin will be unreliable with her current infection. -check a cortisol level. -she has more than one endocrinology deficiency, checking polyglandular syndrome, will need follow up as an out patient.     LOS: 1 day   Marinda Elk M.D. Pager: [phone] [facility] 08/21/2011, 11:32 AM

## 2011-08-22 LAB — GLUCOSE, CAPILLARY
Glucose-Capillary: 75 mg/dL (ref 70–99)
Glucose-Capillary: 67 mg/dL — ABNORMAL LOW (ref 70–99)

## 2011-08-22 LAB — URINE CULTURE
Colony Count: 8000
Culture  Setup Time: 201303051323

## 2011-08-22 MED ORDER — ONDANSETRON HCL 4 MG/2ML IJ SOLN
4.0000 mg | Freq: Four times a day (QID) | INTRAMUSCULAR | Status: DC | PRN
  Administered 2011-08-22: 4 mg via INTRAVENOUS
  Filled 2011-08-22: qty 2

## 2011-08-22 MED ORDER — PROMETHAZINE HCL 25 MG/ML IJ SOLN
25.0000 mg | Freq: Four times a day (QID) | INTRAMUSCULAR | Status: DC | PRN
  Administered 2011-08-22: 25 mg via INTRAVENOUS
  Filled 2011-08-22: qty 1

## 2011-08-22 MED ORDER — MORPHINE SULFATE 2 MG/ML IJ SOLN
1.0000 mg | Freq: Four times a day (QID) | INTRAMUSCULAR | Status: DC | PRN
  Administered 2011-08-22 (×2): 1 mg via INTRAVENOUS
  Filled 2011-08-22 (×2): qty 1

## 2011-08-22 MED ORDER — METRONIDAZOLE 500 MG PO TABS: 500.0000 mg | ORAL_TABLET | Freq: Three times a day (TID) | ORAL | Status: AC

## 2011-08-22 MED ORDER — LEVOTHYROXINE SODIUM 75 MCG PO TABS: 75.0000 ug | ORAL_TABLET | Freq: Every day | ORAL | Status: DC

## 2011-08-22 MED ORDER — CIPROFLOXACIN HCL 250 MG PO TABS: 250.0000 mg | ORAL_TABLET | Freq: Two times a day (BID) | ORAL | Status: AC

## 2011-08-22 MED ORDER — DEXTROSE-NACL 5-0.9 % IV SOLN
INTRAVENOUS | Status: DC
  Administered 2011-08-22: 10:00:00 via INTRAVENOUS

## 2011-08-22 NOTE — Progress Notes (Signed)
Pt c/o nausea--no vomiting.  MD on call paged with orders received.  Zofran given per PRN order--will cont to monitor. [name] Ross

## 2011-08-22 NOTE — Discharge Summary (Signed)
Patient ID: [name] Ross MRN: [id number] DOB/AGE: 1985/11/19 26 y.o.  Admit date: 08/20/2011 Discharge date: 08/22/2011  Primary Care Physician:  Norberto Sorenson, MD, MD  Discharge Diagnoses:    Present on Admission:  .DKA, type 1 .Abdominal pain .UTI (lower urinary tract infection) .Hypothyroidism  Principal Problem:  *DKA, type 1 Active Problems:  Abdominal pain  UTI (lower urinary tract infection)  Hypothyroidism  BV (bacterial vaginosis)   Medication List  As of 08/22/2011  2:29 PM   TAKE these medications         ciprofloxacin 250 MG tablet   Commonly known as: CIPRO   Take 1 tablet (250 mg total) by mouth every 12 (twelve) hours.      insulin aspart 100 UNIT/ML injection   Commonly known as: novoLOG   Inject 2-12 Units into the skin 3 (three) times daily before meals. Per sliding scale      insulin glargine 100 UNIT/ML injection   Commonly known as: LANTUS   Inject 10 Units into the skin 2 (two) times daily.      levothyroxine 75 MCG tablet   Commonly known as: SYNTHROID, LEVOTHROID   Take 1 tablet (75 mcg total) by mouth daily.      metroNIDAZOLE 500 MG tablet   Commonly known as: FLAGYL   Take 1 tablet (500 mg total) by mouth every 8 (eight) hours.            Disposition and Follow-up:  - [facility]/2011  Consults:  No consults requested throughout the hospital stay  Significant Diagnostic Studies:  US Ob Comp Less 14 Wks 09-09-11 IMPRESSION: No intrauterine gestation identified. Observed findings could represent prior spontaneous or therapeutic abortion, if an intrauterine pregnancy was previously confirmed. In the absence of prior confirmation of an IUP, differential diagnosis includes spontaneous or therapeutic abortion, early intrauterine pregnancy too early to visualize, and ectopic pregnancy. Serial quantitative follow-up Beta HCG and/or ultrasound recommended to definitively exclude ectopic pregnancy.     US Ob Transvaginal 2011-09-09    IMPRESSION: No intrauterine gestation identified. Observed findings could represent prior spontaneous or therapeutic abortion, if an intrauterine pregnancy was previously confirmed. In the absence of prior confirmation of an IUP, differential diagnosis includes spontaneous or therapeutic abortion, early intrauterine pregnancy too early to visualize, and ectopic pregnancy. Serial quantitative follow-up Beta HCG and/or ultrasound recommended to definitively exclude ectopic pregnancy.    Dg Abd Acute W/chest September 09, 2011   IMPRESSION: No plain film evidence of bowel obstruction or free intraperitoneal air.     Brief H and P: 26 year old female with history of diabetes mellitus type 1, hypothyroidism presented the ER because of nausea,  vomiting and abdominal pain over the last 2-3 days prior to admission. Patient states that the abdominal pain started off diffusely,constant and was unable to take anything in. In the ER patient was found to have a high blood sugar with mild anion gap. In addition urine analysis was compatible with UTI. Patient has had a recent abortion done one month ago. And her pregnancy screen at this time is positive but beta-hCG levels show is very low and sonogram of the pelvis does not show any intrauterine pregnancy.  Patient denied any diarrhea. Patient denied any chest pain shortness breath dizziness or loss of consciousness   Physical Exam on Discharge:  Filed Vitals:   08/22/11 0500 08/22/11 0545 08/22/11 0620 08/22/11 0800  BP: 161/101 164/95 149/88 152/89  Pulse: 89   89  Temp:  98.4 F (36.9 C)   98.9 F (37.2 C)  TempSrc:    Oral  Resp: 16   18  Height:      Weight: 52.7 kg (116 lb 2.9 oz)     SpO2: 98%   99%     Intake/Output Summary (Last 24 hours) at 08/22/11 1429 Last data filed at 08/22/11 0900  Gross per 24 hour  Intake 2292.5 ml  Output   3325 ml  Net -1032.5 ml    General: Alert, awake, oriented x3, in no acute  distress. HEENT: No bruits, no goiter. Heart: Regular rate and rhythm, without murmurs, rubs, gallops. Lungs: Clear to auscultation bilaterally. Abdomen: Soft, nontender, nondistended, positive bowel sounds. Extremities: No clubbing cyanosis or edema with positive pedal pulses. Neuro: Grossly intact, nonfocal.  CBC:    Component Value Date/Time   WBC 6.5 08/21/2011 1145   HGB 11.1* 08/21/2011 1145   HCT 32.0* 08/21/2011 1145   PLT 294 08/21/2011 1145   MCV 91.7 08/21/2011 1145   NEUTROABS 3.6 08/20/2011 2353   LYMPHSABS 1.8 08/20/2011 2353   MONOABS 0.4 08/20/2011 2353   EOSABS 0.0 08/20/2011 2353   BASOSABS 0.1 08/20/2011 2353    Basic Metabolic Panel:    Component Value Date/Time   NA 141 08/21/2011 0758   K 3.7 08/21/2011 0758   CL 110 08/21/2011 0758   CO2 22 08/21/2011 0758   BUN 15 08/21/2011 0758   CREATININE 0.96 08/21/2011 0758   GLUCOSE 175* 08/21/2011 0758   CALCIUM 8.5 08/21/2011 0758    Hospital Course:   Principal Problem:   *DKA, type 1 - perhaps secondary to non compliance with diabetic regimen - resolved; no anion gap, no acidosis - patient's pharmacy (as per patient's request) called to fill the prescription which included novolog pen sliding scale 2-12 units 3 x day pre meal as needed per sliding scale; lantus pen 10 units 2 x daily  Active Problems:   Abdominal pain - still with complaints of abdominal pain however better since the admission - patient had 1 episode of vomiting today but insists on leaving home today - she requested antiemetic and pain medication; pharmacy called in for phenergan 25 mg Q 6 hours PRN and norco 5-325 mg Q 6 hours as needed for pain   UTI (lower urinary tract infection) - patient can continue ciprofloxacin until 08/25/2011; prescription called in - urinalysis positive but no growth evidence on urine culture   Hypothyroidism - continue synthroid 75 mcg daily   BV (bacterial vaginosis) - continue flagyl 500 mg Q 8 hours for 10  days  Disposition - patient insists on being discharged home; Patient is medically stable at present  Time spent on Discharge: Greater than 30 minutes  Signed: Cletis Muma 08/22/2011, 2:29 PM

## 2011-08-22 NOTE — Progress Notes (Signed)
Patient ready for discharge. Discharge instructions and medications reviewed with patient. Patient voices understanding. Follow up care reviewed with patient. Voices understanding. Copies of D/C instructions given to patient. Insulin pens given to patient per MD order. Ambulatory to door. Home via POV with her significant other driving.

## 2011-08-27 NOTE — ED Provider Notes (Signed)
Medical screening examination/treatment/procedure(s) were conducted as a shared visit with non-physician practitioner(s) and myself.  I personally evaluated the patient during the encounter.  25yF with abdominal pain. Recent elective abortion. [place] did not show any evidence of retained products or any other acute explaining pathology. Mild DKA based on labs. May be reason, at least partially, for symptoms. Admit.  Raeford Razor, MD 08/27/11 0700

## 2011-09-20 ENCOUNTER — Emergency Department (HOSPITAL_COMMUNITY)
Admission: EM | Admit: 2011-09-20 | Discharge: 2011-09-20 | Disposition: A | Discharge status: Home / Self Care | Payer: Medicare Other | Attending: Emergency Medicine | Admitting: Emergency Medicine

## 2011-09-20 ENCOUNTER — Encounter (HOSPITAL_COMMUNITY): Payer: Self-pay | Admitting: *Deleted

## 2011-09-20 DIAGNOSIS — R197 Diarrhea, unspecified: Secondary | ICD-10-CM | POA: Insufficient documentation

## 2011-09-20 DIAGNOSIS — E119 Type 2 diabetes mellitus without complications: Secondary | ICD-10-CM | POA: Insufficient documentation

## 2011-09-20 DIAGNOSIS — Z79899 Other long term (current) drug therapy: Secondary | ICD-10-CM | POA: Insufficient documentation

## 2011-09-20 DIAGNOSIS — E039 Hypothyroidism, unspecified: Secondary | ICD-10-CM | POA: Insufficient documentation

## 2011-09-20 DIAGNOSIS — R112 Nausea with vomiting, unspecified: Secondary | ICD-10-CM | POA: Insufficient documentation

## 2011-09-20 DIAGNOSIS — R000 Tachycardia, unspecified: Secondary | ICD-10-CM | POA: Insufficient documentation

## 2011-09-20 DIAGNOSIS — IMO0001 Reserved for inherently not codable concepts without codable children: Secondary | ICD-10-CM | POA: Insufficient documentation

## 2011-09-20 DIAGNOSIS — R5381 Other malaise: Secondary | ICD-10-CM | POA: Insufficient documentation

## 2011-09-20 DIAGNOSIS — R630 Anorexia: Secondary | ICD-10-CM | POA: Insufficient documentation

## 2011-09-20 DIAGNOSIS — Z794 Long term (current) use of insulin: Secondary | ICD-10-CM | POA: Insufficient documentation

## 2011-09-20 DIAGNOSIS — R739 Hyperglycemia, unspecified: Secondary | ICD-10-CM

## 2011-09-20 DIAGNOSIS — R109 Unspecified abdominal pain: Secondary | ICD-10-CM | POA: Insufficient documentation

## 2011-09-20 LAB — COMPREHENSIVE METABOLIC PANEL
BUN: 20 mg/dL (ref 6–23)
Calcium: 9.8 mg/dL (ref 8.4–10.5)
GFR calc Af Amer: 67 mL/min — ABNORMAL LOW (ref >90)
GFR calc non Af Amer: 58 mL/min — ABNORMAL LOW (ref >90)
Glucose, Bld: 392 mg/dL — ABNORMAL HIGH (ref 70–99)
Total Protein: 8.8 g/dL — ABNORMAL HIGH (ref 6.0–8.3)

## 2011-09-20 LAB — CBC
HCT: 39.9 % (ref 36.0–46.0)
Hemoglobin: 13.9 g/dL (ref 12.0–15.0)
MCH: 31.8 pg (ref 26.0–34.0)
MCV: 91.3 fL (ref 78.0–100.0)
Platelets: 348 10*3/uL (ref 150–400)
RBC: 4.37 MIL/uL (ref 3.87–5.11)

## 2011-09-20 LAB — GLUCOSE, CAPILLARY: Glucose-Capillary: 387 mg/dL — ABNORMAL HIGH (ref 70–99)

## 2011-09-20 LAB — DIFFERENTIAL
Eosinophils Absolute: 0.1 10*3/uL (ref 0.0–0.7)
Eosinophils Relative: 1 % (ref 0–5)
Lymphs Abs: 2.1 10*3/uL (ref 0.7–4.0)
Monocytes Absolute: 0.6 10*3/uL (ref 0.1–1.0)
Monocytes Relative: 6 % (ref 3–12)

## 2011-09-20 LAB — URINALYSIS, ROUTINE W REFLEX MICROSCOPIC
Bilirubin Urine: NEGATIVE
Nitrite: NEGATIVE
Protein, ur: 30 mg/dL — AB
Specific Gravity, Urine: 1.037 — ABNORMAL HIGH (ref 1.005–1.030)
Urobilinogen, UA: 0.2 mg/dL (ref 0.0–1.0)

## 2011-09-20 LAB — URINE MICROSCOPIC-ADD ON

## 2011-09-20 LAB — LIPASE, BLOOD: Lipase: 15 U/L (ref 11–59)

## 2011-09-20 MED ORDER — SODIUM CHLORIDE 0.9 % IV BOLUS (SEPSIS)
1000.0000 mL | Freq: Once | INTRAVENOUS | Status: AC
  Administered 2011-09-20: 1000 mL via INTRAVENOUS

## 2011-09-20 MED ORDER — INSULIN ASPART 100 UNIT/ML ~~LOC~~ SOLN
8.0000 [IU] | Freq: Once | SUBCUTANEOUS | Status: AC
  Administered 2011-09-20: 8 [IU] via INTRAVENOUS
  Filled 2011-09-20: qty 1

## 2011-09-20 MED ORDER — ONDANSETRON 8 MG PO TBDP: 8.0000 mg | ORAL_TABLET | Freq: Three times a day (TID) | ORAL | Status: AC | PRN

## 2011-09-20 MED ORDER — ONDANSETRON HCL 4 MG/2ML IJ SOLN
4.0000 mg | Freq: Once | INTRAMUSCULAR | Status: AC
  Administered 2011-09-20: 4 mg via INTRAVENOUS
  Filled 2011-09-20: qty 2

## 2011-09-20 MED ORDER — INSULIN REGULAR HUMAN 100 UNIT/ML IJ SOLN: 8.0000 [IU] | Freq: Once | INTRAMUSCULAR | Status: DC

## 2011-09-20 MED ORDER — MORPHINE SULFATE 4 MG/ML IJ SOLN
4.0000 mg | Freq: Once | INTRAMUSCULAR | Status: AC
  Administered 2011-09-20: 4 mg via INTRAVENOUS
  Filled 2011-09-20: qty 1

## 2011-09-20 NOTE — ED Notes (Signed)
Pt given water to drink. rn encouraged fluids

## 2011-09-20 NOTE — ED Provider Notes (Signed)
History     CSN: [id number]  Arrival date & time 09/20/11  1507   First MD Initiated Contact with Patient 09/20/11 1605      Chief Complaint  Patient presents with  . Hyperglycemia    (Consider location/radiation/quality/duration/timing/severity/associated sxs/prior treatment) The history is provided by the patient.   patient had nausea vomiting and abdominal pain since Tuesday. She states she's been unable to keep fluids down. no diarrhea. Patient's sugars have been elevated. She is diabetic worried that she may be in DKA. The pain is constant. Nothing is changed. She's had decreased appetite. She states she could be pregnant. No dysuria.   Past Medical History  Diagnosis Date  . Diabetes mellitus   . Hypothyroidism   . Anemia     Past Surgical History  Procedure Date  . Induced abortion     History reviewed. No pertinent family history.  History  Substance Use Topics  . Smoking status: Never Smoker   . Smokeless tobacco: Never Used  . Alcohol Use: No    OB History    Grav Para Term Preterm Abortions TAB SAB Ect Mult Living                  Review of Systems  Constitutional: Positive for fatigue. Negative for fever.  Respiratory: Negative for cough.   Cardiovascular: Negative for chest pain.  Gastrointestinal: Positive for nausea, vomiting and abdominal pain. Negative for constipation.  Genitourinary: Negative for dysuria.  Musculoskeletal: Positive for myalgias. Negative for back pain.  Skin: Negative for pallor.  Neurological: Positive for headaches.    Allergies  Review of patient's allergies indicates no known allergies.  Home Medications   Current Outpatient Rx  Name Route Sig Dispense Refill  . INSULIN ASPART 100 UNIT/ML [place] SOLN Subcutaneous Inject 2-12 Units into the skin 3 (three) times daily before meals. Per sliding scale    . INSULIN GLARGINE 100 UNIT/ML [place] SOLN Subcutaneous Inject 10 Units into the skin 2 (two) times daily.    Marland Kitchen  LEVOTHYROXINE SODIUM 75 MCG PO TABS Oral Take 1 tablet (75 mcg total) by mouth daily. 30 tablet 0  . ONDANSETRON 8 MG PO TBDP Oral Take 1 tablet (8 mg total) by mouth every 8 (eight) hours as needed for nausea. 20 tablet 0    BP 105/70  Pulse 86  Temp(Src) 98.2 F (36.8 C) (Oral)  Resp 16  SpO2 100%  LMP 08/20/2011  Physical Exam  Nursing note and vitals reviewed. Constitutional: She is oriented to person, place, and time. She appears well-developed and well-nourished.  HENT:  Head: Normocephalic and atraumatic.  Eyes: EOM are normal. Pupils are equal, round, and reactive to light.  Neck: Normal range of motion. Neck supple.  Cardiovascular: Regular rhythm and normal heart sounds.   No murmur heard.      Mild tachycardia  Pulmonary/Chest: Effort normal and breath sounds normal. No respiratory distress. She has no wheezes. She has no rales.  Abdominal: Soft. Bowel sounds are normal. She exhibits no distension. There is tenderness. There is no rebound and no guarding.       Minimal diffuse tenderness  Musculoskeletal: Normal range of motion.  Neurological: She is alert and oriented to person, place, and time. No cranial nerve deficit.  Skin: Skin is warm and dry.  Psychiatric: She has a normal mood and affect. Her speech is normal.    ED Course  Procedures (including critical care time)  Labs Reviewed  GLUCOSE, CAPILLARY - Abnormal; Notable  for the following:    Glucose-Capillary 387 (*)    All other components within normal limits  COMPREHENSIVE METABOLIC PANEL - Abnormal; Notable for the following:    Sodium 133 (*)    Chloride 93 (*)    Glucose, Bld 392 (*)    Creatinine, Ser 1.28 (*)    Total Protein 8.8 (*)    GFR calc non Af Amer 58 (*)    GFR calc Af Amer 67 (*)    All other components within normal limits  URINALYSIS, ROUTINE W REFLEX MICROSCOPIC - Abnormal; Notable for the following:    APPearance CLOUDY (*)    Specific Gravity, Urine 1.037 (*)    Glucose, UA  >1000 (*)    Hgb urine dipstick MODERATE (*)    Ketones, ur 40 (*)    Protein, ur 30 (*)    Leukocytes, UA MODERATE (*)    All other components within normal limits  URINE MICROSCOPIC-ADD ON - Abnormal; Notable for the following:    Squamous Epithelial / LPF FEW (*)    Bacteria, UA MANY (*)    All other components within normal limits  GLUCOSE, CAPILLARY - Abnormal; Notable for the following:    Glucose-Capillary 339 (*)    All other components within normal limits  GLUCOSE, CAPILLARY - Abnormal; Notable for the following:    Glucose-Capillary 199 (*)    All other components within normal limits  CBC  DIFFERENTIAL  LIPASE, BLOOD  PREGNANCY, URINE   No results found.   1. Hyperglycemia   2. Nausea vomiting and diarrhea       MDM  Nausea vomiting diarrhea. Hyperglycemia. Only 40 ketones in urine.. Patient feels much better as tolerated orals after some fluids and IV insulin. Patient discharged home and followup with her primary care    as needed. There was bacteria in the urine, but no white cells.      Juliet Rude. Rubin Payor, MD 09/21/11 1610

## 2011-09-20 NOTE — ED Notes (Signed)
Pt is from home. Alert and oriented x4. C/o of abdominal pain since tues 8/10, with lots of nausea and vomitting. Denies diarrhea. Has not kept fluids down since Tuesday. Pt thinks she might be in DKA. Checked blood sugar today and it was in the 300s, ems told her blood sugar was 350. Pt did take insulin today.

## 2011-09-20 NOTE — Discharge Instructions (Signed)
Diet for Diarrhea, Adult Having frequent, runny stools (diarrhea) has many causes. Diarrhea may be caused or worsened by food or drink. Diarrhea may be relieved by changing your diet. IF YOU ARE NOT TOLERATING SOLID FOODS:  Drink enough water and fluids to keep your urine clear or pale yellow.   Avoid sugary drinks and sodas as well as milk-based beverages.   Avoid beverages containing caffeine and alcohol.   You may try rehydrating beverages. You can make your own by following this recipe:    tsp table salt.    tsp baking soda.   ? tsp salt substitute (potassium chloride).   1 tbs + 1 tsp sugar.   1 qt water.  As your stools become more solid, you can start eating solid foods. Add foods one at a time. If a certain food causes your diarrhea to get worse, avoid that food and try other foods. A low fiber, low-fat, and lactose-free diet is recommended. Small, frequent meals may be better tolerated.  Starches  Allowed:  White, French, and pita breads, plain rolls, buns, bagels. Plain muffins, matzo. Soda, saltine, or graham crackers. Pretzels, melba toast, zwieback. Cooked cereals made with water: cornmeal, farina, cream cereals. Dry cereals: refined corn, wheat, rice. Potatoes prepared any way without skins, refined macaroni, spaghetti, noodles, refined rice.   Avoid:  Bread, rolls, or crackers made with whole wheat, multi-grains, rye, bran seeds, nuts, or coconut. Corn tortillas or taco shells. Cereals containing whole grains, multi-grains, bran, coconut, nuts, or raisins. Cooked or dry oatmeal. Coarse wheat cereals, granola. Cereals advertised as "high-fiber." Potato skins. Whole grain pasta, wild or brown rice. Popcorn. Sweet potatoes/yams. Sweet rolls, doughnuts, waffles, pancakes, sweet breads.  Vegetables  Allowed: Strained tomato and vegetable juices. Most well-cooked and canned vegetables without seeds. Fresh: Tender lettuce, cucumber without the skin, cabbage, spinach, bean  sprouts.   Avoid: Fresh, cooked, or canned: Artichokes, baked beans, beet greens, broccoli, Brussels sprouts, corn, kale, legumes, peas, sweet potatoes. Cooked: Green or red cabbage, spinach. Avoid large servings of any vegetables, because vegetables shrink when cooked, and they contain more fiber per serving than fresh vegetables.  Fruit  Allowed: All fruit juices except prune juice. Cooked or canned: Apricots, applesauce, cantaloupe, cherries, fruit cocktail, grapefruit, grapes, kiwi, mandarin oranges, peaches, pears, plums, watermelon. Fresh: Apples without skin, ripe banana, grapes, cantaloupe, cherries, grapefruit, peaches, oranges, plums. Keep servings limited to  cup or 1 piece.   Avoid: Fresh: Apple with skin, apricots, mango, pears, raspberries, strawberries. Prune juice, stewed or dried prunes. Dried fruits, raisins, dates. Large servings of all fresh fruits.  Meat and Meat Substitutes  Allowed: Ground or well-cooked tender beef, ham, veal, lamb, pork, or poultry. Eggs, plain cheese. Fish, oysters, shrimp, lobster, other seafoods. Liver, organ meats.   Avoid: Tough, fibrous meats with gristle. Peanut butter, smooth or chunky. Cheese, nuts, seeds, legumes, dried peas, beans, lentils.  Milk  Allowed: Yogurt, lactose-free milk, kefir, drinkable yogurt, buttermilk, soy milk.   Avoid: Milk, chocolate milk, beverages made with milk, such as milk shakes.  Soups  Allowed: Bouillon, broth, or soups made from allowed foods. Any strained soup.   Avoid: Soups made from vegetables that are not allowed, cream or milk-based soups.  Desserts and Sweets  Allowed: Sugar-free gelatin, sugar-free frozen ice pops made without sugar alcohol.   Avoid: Plain cakes and cookies, pie made with allowed fruit, pudding, custard, cream pie. Gelatin, fruit, ice, sherbet, frozen ice pops. Ice cream, ice milk without nuts. Plain hard candy,   honey, jelly, molasses, syrup, sugar, chocolate syrup, gumdrops,  marshmallows.  Fats and Oils  Allowed: Avoid any fats and oils.   Avoid: Seeds, nuts, olives, avocados. Margarine, butter, cream, mayonnaise, salad oils, plain salad dressings made from allowed foods. Plain gravy, crisp bacon without rind.  Beverages  Allowed: Water, decaffeinated teas, oral rehydration solutions, sugar-free beverages.   Avoid: Fruit juices, caffeinated beverages (coffee, tea, soda or pop), alcohol, sports drinks, or lemon-lime soda or pop.  Condiments  Allowed: Ketchup, mustard, horseradish, vinegar, cream sauce, cheese sauce, cocoa powder. Spices in moderation: allspice, basil, bay leaves, celery powder or leaves, cinnamon, cumin powder, curry powder, ginger, mace, marjoram, onion or garlic powder, oregano, paprika, parsley flakes, ground pepper, rosemary, sage, savory, tarragon, thyme, turmeric.   Avoid: Coconut, honey.  Weight Monitoring: Weigh yourself every day. You should weigh yourself in the morning after you urinate and before you eat breakfast. Wear the same amount of clothing when you weigh yourself. Record your weight daily. Bring your recorded weights to your clinic visits. Tell your caregiver right away if you have gained 3 lb/1.4 kg or more in 1 day, 5 lb/2.3 kg in a week, or whatever amount you were told to report. SEEK IMMEDIATE MEDICAL CARE IF:   You are unable to keep fluids down.   You start to throw up (vomit) or diarrhea keeps coming back (persistent).   Abdominal pain develops, increases, or can be felt in one place (localizes).   You have an oral temperature above 102 F (38.9 C), not controlled by medicine.   Diarrhea contains blood or mucus.   You develop excessive weakness, dizziness, fainting, or extreme thirst.  MAKE SURE YOU:   Understand these instructions.   Will watch your condition.   Will get help right away if you are not doing well or get worse.  Document Released: 08/25/2003 Document Revised: 05/24/2011 Document Reviewed:  12/16/2008 [facility] Patient Information 2012 [place], [place].Hyperglycemia Hyperglycemia occurs when the glucose (sugar) in your blood is too high. Hyperglycemia can happen for many reasons, but it most often happens to people who do not know they have diabetes or are not managing their diabetes properly.  CAUSES  Whether you have diabetes or not, there are other causes of hyperglycemia. Hyperglycemia can occur when you have diabetes, but it can also occur in other situations that you might not be as aware of, such as: Diabetes  If you have diabetes and are having problems controlling your blood glucose, hyperglycemia could occur because of some of the following reasons:   Not following your meal plan.   Not taking your diabetes medications or not taking it properly.   Exercising less or doing less activity than you normally do.   Being sick.  Pre-diabetes  This cannot be ignored. Before people develop Type 2 diabetes, they almost always have "pre-diabetes." This is when your blood glucose levels are higher than normal, but not yet high enough to be diagnosed as diabetes. Research has shown that some long-term damage to the body, especially the heart and circulatory system, may already be occurring during pre-diabetes. If you take action to manage your blood glucose when you have pre-diabetes, you may delay or prevent Type 2 diabetes from developing.  Stress  If you have diabetes, you may be "diet" controlled or on oral medications or insulin to control your diabetes. However, you may find that your blood glucose is higher than usual in the hospital whether you have diabetes or not. This is often  referred to as "stress hyperglycemia." Stress can elevate your blood glucose. This happens because of hormones put out by the body during times of stress. If stress has been the cause of your high blood glucose, it can be followed regularly by your caregiver. That way he/she can make sure your  hyperglycemia does not continue to get worse or progress to diabetes.  Steroids  Steroids are medications that act on the infection fighting system (immune system) to block inflammation or infection. One side effect can be a rise in blood glucose. Most people can produce enough extra insulin to allow for this rise, but for those who cannot, steroids make blood glucose levels go even higher. It is not unusual for steroid treatments to "uncover" diabetes that is developing. It is not always possible to determine if the hyperglycemia will go away after the steroids are stopped. A special blood test called an A1c is sometimes done to determine if your blood glucose was elevated before the steroids were started.  SYMPTOMS  Thirsty.   Frequent urination.   Dry mouth.   Blurred vision.   Tired or fatigue.   Weakness.   Sleepy.   Tingling in feet or leg.  DIAGNOSIS  Diagnosis is made by monitoring blood glucose in one or all of the following ways:  A1c test. This is a chemical found in your blood.   Fingerstick blood glucose monitoring.   Laboratory results.  TREATMENT  First, knowing the cause of the hyperglycemia is important before the hyperglycemia can be treated. Treatment may include, but is not be limited to:  Education.   Change or adjustment in medications.   Change or adjustment in meal plan.   Treatment for an illness, infection, etc.   More frequent blood glucose monitoring.   Change in exercise plan.   Decreasing or stopping steroids.   Lifestyle changes.  HOME CARE INSTRUCTIONS   Test your blood glucose as directed.   Exercise regularly. Your caregiver will give you instructions about exercise. Pre-diabetes or diabetes which comes on with stress is helped by exercising.   Eat wholesome, balanced meals. Eat often and at regular, fixed times. Your caregiver or nutritionist will give you a meal plan to guide your sugar intake.   Being at an ideal weight is  important. If needed, losing as little as 10 to 15 pounds may help improve blood glucose levels.  SEEK MEDICAL CARE IF:   You have questions about medicine, activity, or diet.   You continue to have symptoms (problems such as increased thirst, urination, or weight gain).  SEEK IMMEDIATE MEDICAL CARE IF:   You are vomiting or have diarrhea.   Your breath smells fruity.   You are breathing faster or slower.   You are very sleepy or incoherent.   You have numbness, tingling, or pain in your feet or hands.   You have chest pain.   Your symptoms get worse even though you have been following your caregiver's orders.   If you have any other questions or concerns.  Document Released: 11/28/2000 Document Revised: 05/24/2011 Document Reviewed: 01/24/2009 [facility] Patient Information 2012 [place], [place].Nausea and Vomiting Nausea is a sick feeling that often comes before throwing up (vomiting). Vomiting is a reflex where stomach contents come out of your mouth. Vomiting can cause severe loss of body fluids (dehydration). Children and elderly adults can become dehydrated quickly, especially if they also have diarrhea. Nausea and vomiting are symptoms of a condition or disease. It is important to  find the cause of your symptoms. CAUSES   Direct irritation of the stomach lining. This irritation can result from increased acid production (gastroesophageal reflux disease), infection, food poisoning, taking certain medicines (such as nonsteroidal anti-inflammatory drugs), alcohol use, or tobacco use.   Signals from the brain.These signals could be caused by a headache, heat exposure, an inner ear disturbance, increased pressure in the brain from injury, infection, a tumor, or a concussion, pain, emotional stimulus, or metabolic problems.   An obstruction in the gastrointestinal tract (bowel obstruction).   Illnesses such as diabetes, hepatitis, gallbladder problems, appendicitis, kidney problems,  cancer, sepsis, atypical symptoms of a heart attack, or eating disorders.   Medical treatments such as chemotherapy and radiation.   Receiving medicine that makes you sleep (general anesthetic) during surgery.  DIAGNOSIS Your caregiver may ask for tests to be done if the problems do not improve after a few days. Tests may also be done if symptoms are severe or if the reason for the nausea and vomiting is not clear. Tests may include:  Urine tests.   Blood tests.   Stool tests.   Cultures (to look for evidence of infection).   X-rays or other imaging studies.  Test results can help your caregiver make decisions about treatment or the need for additional tests. TREATMENT You need to stay well hydrated. Drink frequently but in small amounts.You may wish to drink water, sports drinks, clear broth, or eat frozen ice pops or gelatin dessert to help stay hydrated.When you eat, eating slowly may help prevent nausea.There are also some antinausea medicines that may help prevent nausea. HOME CARE INSTRUCTIONS   Take all medicine as directed by your caregiver.   If you do not have an appetite, do not force yourself to eat. However, you must continue to drink fluids.   If you have an appetite, eat a normal diet unless your caregiver tells you differently.   Eat a variety of complex carbohydrates (rice, wheat, potatoes, bread), lean meats, yogurt, fruits, and vegetables.   Avoid high-fat foods because they are more difficult to digest.   Drink enough water and fluids to keep your urine clear or pale yellow.   If you are dehydrated, ask your caregiver for specific rehydration instructions. Signs of dehydration may include:   Severe thirst.   Dry lips and mouth.   Dizziness.   Dark urine.   Decreasing urine frequency and amount.   Confusion.   Rapid breathing or pulse.  SEEK IMMEDIATE MEDICAL CARE IF:   You have blood or brown flecks (like coffee grounds) in your vomit.   You  have black or bloody stools.   You have a severe headache or stiff neck.   You are confused.   You have severe abdominal pain.   You have chest pain or trouble breathing.   You do not urinate at least once every 8 hours.   You develop cold or clammy skin.   You continue to vomit for longer than 24 to 48 hours.   You have a fever.  MAKE SURE YOU:   Understand these instructions.   Will watch your condition.   Will get help right away if you are not doing well or get worse.  Document Released: 06/04/2005 Document Revised: 05/24/2011 Document Reviewed: 11/01/2010 [facility] Patient Information 2012 [place], [place].

## 2011-10-17 ENCOUNTER — Inpatient Hospital Stay (HOSPITAL_COMMUNITY)
Admission: EM | Admit: 2011-10-17 | Discharge: 2011-10-23 | DRG: 638 | Disposition: A | Discharge status: Home / Self Care | Payer: Medicare Other | Attending: Internal Medicine | Admitting: Internal Medicine

## 2011-10-17 DIAGNOSIS — D696 Thrombocytopenia, unspecified: Secondary | ICD-10-CM | POA: Diagnosis present

## 2011-10-17 DIAGNOSIS — N760 Acute vaginitis: Secondary | ICD-10-CM

## 2011-10-17 DIAGNOSIS — N2589 Other disorders resulting from impaired renal tubular function: Secondary | ICD-10-CM | POA: Diagnosis present

## 2011-10-17 DIAGNOSIS — Z87440 Personal history of urinary (tract) infections: Secondary | ICD-10-CM

## 2011-10-17 DIAGNOSIS — E872 Acidosis, unspecified: Secondary | ICD-10-CM | POA: Diagnosis present

## 2011-10-17 DIAGNOSIS — E1010 Type 1 diabetes mellitus with ketoacidosis without coma: Principal | ICD-10-CM | POA: Diagnosis present

## 2011-10-17 DIAGNOSIS — R509 Fever, unspecified: Secondary | ICD-10-CM

## 2011-10-17 DIAGNOSIS — N179 Acute kidney failure, unspecified: Secondary | ICD-10-CM | POA: Diagnosis present

## 2011-10-17 DIAGNOSIS — N19 Unspecified kidney failure: Secondary | ICD-10-CM | POA: Diagnosis present

## 2011-10-17 DIAGNOSIS — N390 Urinary tract infection, site not specified: Secondary | ICD-10-CM | POA: Diagnosis present

## 2011-10-17 DIAGNOSIS — A498 Other bacterial infections of unspecified site: Secondary | ICD-10-CM | POA: Diagnosis present

## 2011-10-17 DIAGNOSIS — E869 Volume depletion, unspecified: Secondary | ICD-10-CM | POA: Diagnosis present

## 2011-10-17 DIAGNOSIS — R109 Unspecified abdominal pain: Secondary | ICD-10-CM | POA: Diagnosis present

## 2011-10-17 DIAGNOSIS — R000 Tachycardia, unspecified: Secondary | ICD-10-CM | POA: Diagnosis not present

## 2011-10-17 DIAGNOSIS — R7881 Bacteremia: Secondary | ICD-10-CM | POA: Diagnosis present

## 2011-10-17 DIAGNOSIS — K5900 Constipation, unspecified: Secondary | ICD-10-CM | POA: Diagnosis present

## 2011-10-17 DIAGNOSIS — D649 Anemia, unspecified: Secondary | ICD-10-CM | POA: Diagnosis present

## 2011-10-17 DIAGNOSIS — E039 Hypothyroidism, unspecified: Secondary | ICD-10-CM | POA: Diagnosis present

## 2011-10-17 DIAGNOSIS — E871 Hypo-osmolality and hyponatremia: Secondary | ICD-10-CM | POA: Diagnosis present

## 2011-10-17 DIAGNOSIS — Z794 Long term (current) use of insulin: Secondary | ICD-10-CM

## 2011-10-17 MED ORDER — ACETAMINOPHEN 325 MG PO TABS
ORAL_TABLET | ORAL | Status: AC
  Administered 2011-10-17: 650 mg
  Filled 2011-10-17: qty 2

## 2011-10-17 NOTE — ED Notes (Signed)
JYN:[id number]<AO> Expected date:<BR> Expected time:<BR> Means of arrival:<BR> Comments:<BR> EMS/shaking/CBG&gt;400

## 2011-10-17 NOTE — ED Notes (Signed)
Brought in by EMS with c/o "feeling bad all day". Per EMS, pt is diabetic and her CBG registered "high" on glucometer. Pt presents to ED with c/o "feeling worst", with tachycardia---HR 130.

## 2011-10-18 ENCOUNTER — Encounter (HOSPITAL_COMMUNITY): Payer: Self-pay | Admitting: Internal Medicine

## 2011-10-18 ENCOUNTER — Emergency Department (HOSPITAL_COMMUNITY): Payer: Medicare Other

## 2011-10-18 ENCOUNTER — Inpatient Hospital Stay (HOSPITAL_COMMUNITY): Payer: Medicare Other

## 2011-10-18 DIAGNOSIS — E869 Volume depletion, unspecified: Secondary | ICD-10-CM | POA: Diagnosis present

## 2011-10-18 DIAGNOSIS — T7840XA Allergy, unspecified, initial encounter: Secondary | ICD-10-CM

## 2011-10-18 DIAGNOSIS — E785 Hyperlipidemia, unspecified: Secondary | ICD-10-CM

## 2011-10-18 DIAGNOSIS — I10 Essential (primary) hypertension: Secondary | ICD-10-CM

## 2011-10-18 LAB — GLUCOSE, CAPILLARY
Glucose-Capillary: 115 mg/dL — ABNORMAL HIGH (ref 70–99)
Glucose-Capillary: 125 mg/dL — ABNORMAL HIGH (ref 70–99)
Glucose-Capillary: 128 mg/dL — ABNORMAL HIGH (ref 70–99)
Glucose-Capillary: 145 mg/dL — ABNORMAL HIGH (ref 70–99)
Glucose-Capillary: 282 mg/dL — ABNORMAL HIGH (ref 70–99)
Glucose-Capillary: 420 mg/dL — ABNORMAL HIGH (ref 70–99)
Glucose-Capillary: >600 mg/dL (ref 70–99)

## 2011-10-18 LAB — BASIC METABOLIC PANEL
BUN: 43 mg/dL — ABNORMAL HIGH (ref 6–23)
CO2: 18 mEq/L — ABNORMAL LOW (ref 19–32)
CO2: 19 mEq/L (ref 19–32)
Chloride: 101 mEq/L (ref 96–112)
Chloride: 104 mEq/L (ref 96–112)
Creatinine, Ser: 2.98 mg/dL — ABNORMAL HIGH (ref 0.50–1.10)
Creatinine, Ser: 2.96 mg/dL — ABNORMAL HIGH (ref 0.50–1.10)
GFR calc Af Amer: 24 mL/min — ABNORMAL LOW (ref >90)
GFR calc Af Amer: 24 mL/min — ABNORMAL LOW (ref >90)
Potassium: 4.2 mEq/L (ref 3.5–5.1)
Potassium: 4.2 mEq/L (ref 3.5–5.1)
Sodium: 132 mEq/L — ABNORMAL LOW (ref 135–145)

## 2011-10-18 LAB — HEMOGLOBIN A1C
Hgb A1c MFr Bld: 11.9 % — ABNORMAL HIGH (ref <5.7)
Mean Plasma Glucose: 295 mg/dL — ABNORMAL HIGH (ref <117)

## 2011-10-18 LAB — BLOOD GAS, VENOUS
Bicarbonate: 16.2 mEq/L — ABNORMAL LOW (ref 20.0–24.0)
O2 Saturation: 84.3 %
Patient temperature: 98.6
TCO2: 15.2 mmol/L (ref 0–100)
pH, Ven: 7.328 — ABNORMAL HIGH (ref 7.250–7.300)
pO2, Ven: 58.5 mmHg — ABNORMAL HIGH (ref 30.0–45.0)

## 2011-10-18 LAB — CBC
HCT: 29.9 % — ABNORMAL LOW (ref 36.0–46.0)
HCT: 30.8 % — ABNORMAL LOW (ref 36.0–46.0)
Hemoglobin: 10.6 g/dL — ABNORMAL LOW (ref 12.0–15.0)
MCV: 87.2 fL (ref 78.0–100.0)
Platelets: 117 10*3/uL — ABNORMAL LOW (ref 150–400)
RBC: 3.43 MIL/uL — ABNORMAL LOW (ref 3.87–5.11)
RDW: 13.8 % (ref 11.5–15.5)
WBC: 4.4 10*3/uL (ref 4.0–10.5)
WBC: 6.3 10*3/uL (ref 4.0–10.5)

## 2011-10-18 LAB — URINALYSIS, ROUTINE W REFLEX MICROSCOPIC
Bilirubin Urine: NEGATIVE
Nitrite: NEGATIVE
Specific Gravity, Urine: 1.027 (ref 1.005–1.030)
pH: 6 (ref 5.0–8.0)

## 2011-10-18 LAB — DIFFERENTIAL
Basophils Absolute: 0 10*3/uL (ref 0.0–0.1)
Lymphocytes Relative: 7 % — ABNORMAL LOW (ref 12–46)
Monocytes Relative: 3 % (ref 3–12)

## 2011-10-18 LAB — KETONES, QUALITATIVE

## 2011-10-18 LAB — URINE MICROSCOPIC-ADD ON

## 2011-10-18 LAB — COMPREHENSIVE METABOLIC PANEL
ALT: 42 U/L — ABNORMAL HIGH (ref 0–35)
Albumin: 2.8 g/dL — ABNORMAL LOW (ref 3.5–5.2)
Alkaline Phosphatase: 127 U/L — ABNORMAL HIGH (ref 39–117)
Potassium: 4.2 mEq/L (ref 3.5–5.1)
Sodium: 121 mEq/L — ABNORMAL LOW (ref 135–145)
Total Protein: 7.3 g/dL (ref 6.0–8.3)

## 2011-10-18 LAB — LACTIC ACID, PLASMA: Lactic Acid, Venous: 1.4 mmol/L (ref 0.5–2.2)

## 2011-10-18 LAB — POCT PREGNANCY, URINE: Preg Test, Ur: NEGATIVE

## 2011-10-18 LAB — TSH: TSH: 2.096 u[IU]/mL (ref 0.350–4.500)

## 2011-10-18 MED ORDER — INSULIN GLARGINE 100 UNIT/ML ~~LOC~~ SOLN
10.0000 [IU] | Freq: Every day | SUBCUTANEOUS | Status: DC
  Administered 2011-10-18 – 2011-10-19 (×2): 10 [IU] via SUBCUTANEOUS

## 2011-10-18 MED ORDER — DOCUSATE SODIUM 100 MG PO CAPS
100.0000 mg | ORAL_CAPSULE | Freq: Two times a day (BID) | ORAL | Status: DC
  Administered 2011-10-18 – 2011-10-22 (×9): 100 mg via ORAL
  Filled 2011-10-18 (×13): qty 1

## 2011-10-18 MED ORDER — PIPERACILLIN-TAZOBACTAM 3.375 G IVPB
3.3750 g | Freq: Three times a day (TID) | INTRAVENOUS | Status: DC
  Administered 2011-10-18 – 2011-10-20 (×7): 3.375 g via INTRAVENOUS
  Filled 2011-10-18 (×7): qty 50

## 2011-10-18 MED ORDER — ONDANSETRON HCL 4 MG/2ML IJ SOLN
4.0000 mg | Freq: Once | INTRAMUSCULAR | Status: AC
  Administered 2011-10-18: 4 mg via INTRAVENOUS
  Filled 2011-10-18: qty 2

## 2011-10-18 MED ORDER — SODIUM CHLORIDE 0.9 % IV SOLN
INTRAVENOUS | Status: DC
  Filled 2011-10-18: qty 1

## 2011-10-18 MED ORDER — SODIUM CHLORIDE 0.9 % IV SOLN
INTRAVENOUS | Status: DC
  Administered 2011-10-18: 5.4 [IU]/h via INTRAVENOUS
  Filled 2011-10-18: qty 1

## 2011-10-18 MED ORDER — SODIUM CHLORIDE 0.9 % IV SOLN: INTRAVENOUS | Status: DC

## 2011-10-18 MED ORDER — SODIUM CHLORIDE 0.9 % IV SOLN
INTRAVENOUS | Status: DC
  Administered 2011-10-18: 05:00:00 via INTRAVENOUS

## 2011-10-18 MED ORDER — HEPARIN SODIUM (PORCINE) 5000 UNIT/ML IJ SOLN: 5000.0000 [IU] | Freq: Three times a day (TID) | INTRAMUSCULAR | Status: DC

## 2011-10-18 MED ORDER — POLYETHYLENE GLYCOL 3350 17 G PO PACK
17.0000 g | PACK | Freq: Every day | ORAL | Status: DC
  Administered 2011-10-18: 17 g via ORAL
  Filled 2011-10-18 (×7): qty 1

## 2011-10-18 MED ORDER — INSULIN GLARGINE 100 UNIT/ML ~~LOC~~ SOLN
10.0000 [IU] | Freq: Once | SUBCUTANEOUS | Status: AC
  Administered 2011-10-18: 10 [IU] via SUBCUTANEOUS

## 2011-10-18 MED ORDER — ONDANSETRON HCL 4 MG PO TABS: 4.0000 mg | ORAL_TABLET | Freq: Four times a day (QID) | ORAL | Status: DC | PRN

## 2011-10-18 MED ORDER — ACETAMINOPHEN 325 MG PO TABS
650.0000 mg | ORAL_TABLET | Freq: Four times a day (QID) | ORAL | Status: DC | PRN
  Administered 2011-10-18 – 2011-10-23 (×5): 650 mg via ORAL
  Filled 2011-10-18 (×3): qty 2
  Filled 2011-10-18: qty 1
  Filled 2011-10-18: qty 2

## 2011-10-18 MED ORDER — INSULIN ASPART 100 UNIT/ML ~~LOC~~ SOLN
0.0000 [IU] | Freq: Three times a day (TID) | SUBCUTANEOUS | Status: DC
Start: 2011-10-18 — End: 2011-10-18
  Administered 2011-10-18 (×2): 3 [IU] via SUBCUTANEOUS

## 2011-10-18 MED ORDER — DEXTROSE 5 % IV SOLN
1.0000 g | INTRAVENOUS | Status: DC
  Administered 2011-10-18: 1 g via INTRAVENOUS
  Filled 2011-10-18: qty 10

## 2011-10-18 MED ORDER — INSULIN ASPART 100 UNIT/ML ~~LOC~~ SOLN
0.0000 [IU] | SUBCUTANEOUS | Status: DC
  Administered 2011-10-18: 3 [IU] via SUBCUTANEOUS
  Administered 2011-10-19: 8 [IU] via SUBCUTANEOUS
  Administered 2011-10-19: 5 [IU] via SUBCUTANEOUS
  Administered 2011-10-19: 3 [IU] via SUBCUTANEOUS
  Administered 2011-10-19: 5 [IU] via SUBCUTANEOUS
  Administered 2011-10-19: 3 [IU] via SUBCUTANEOUS
  Administered 2011-10-19: 8 [IU] via SUBCUTANEOUS
  Administered 2011-10-20: 9 [IU] via SUBCUTANEOUS

## 2011-10-18 MED ORDER — SODIUM CHLORIDE 0.9 % IJ SOLN
3.0000 mL | Freq: Two times a day (BID) | INTRAMUSCULAR | Status: DC
  Administered 2011-10-19 – 2011-10-20 (×3): 3 mL via INTRAVENOUS
  Administered 2011-10-20: 22:00:00 via INTRAVENOUS
  Administered 2011-10-21 – 2011-10-23 (×4): 3 mL via INTRAVENOUS

## 2011-10-18 MED ORDER — SODIUM CHLORIDE 0.9 % IV SOLN
1000.0000 mL | Freq: Once | INTRAVENOUS | Status: AC
  Administered 2011-10-18: 1000 mL via INTRAVENOUS

## 2011-10-18 MED ORDER — SODIUM CHLORIDE 0.9 % IV SOLN
1000.0000 mL | INTRAVENOUS | Status: DC
  Administered 2011-10-18 – 2011-10-19 (×2): 1000 mL via INTRAVENOUS
  Administered 2011-10-19: 500 mL via INTRAVENOUS

## 2011-10-18 MED ORDER — SODIUM CHLORIDE 0.9 % IV SOLN
INTRAVENOUS | Status: AC
  Administered 2011-10-18: 04:00:00 via INTRAVENOUS

## 2011-10-18 MED ORDER — HEPARIN SODIUM (PORCINE) 5000 UNIT/ML IJ SOLN
5000.0000 [IU] | Freq: Three times a day (TID) | INTRAMUSCULAR | Status: DC
  Administered 2011-10-18 – 2011-10-22 (×15): 5000 [IU] via SUBCUTANEOUS
  Filled 2011-10-18 (×21): qty 1

## 2011-10-18 MED ORDER — POTASSIUM CHLORIDE 10 MEQ/100ML IV SOLN
10.0000 meq | INTRAVENOUS | Status: AC
  Administered 2011-10-18 (×2): 10 meq via INTRAVENOUS
  Filled 2011-10-18 (×2): qty 100

## 2011-10-18 MED ORDER — TRAMADOL HCL 50 MG PO TABS
50.0000 mg | ORAL_TABLET | Freq: Two times a day (BID) | ORAL | Status: DC | PRN
  Administered 2011-10-18: 50 mg via ORAL
  Filled 2011-10-18: qty 1

## 2011-10-18 MED ORDER — DEXTROSE 50 % IV SOLN
25.0000 mL | INTRAVENOUS | Status: DC | PRN
  Filled 2011-10-18: qty 50

## 2011-10-18 MED ORDER — LEVOTHYROXINE SODIUM 75 MCG PO TABS
75.0000 ug | ORAL_TABLET | Freq: Every day | ORAL | Status: DC
  Administered 2011-10-18 – 2011-10-23 (×6): 75 ug via ORAL
  Filled 2011-10-18 (×7): qty 1

## 2011-10-18 MED ORDER — DEXTROSE-NACL 5-0.45 % IV SOLN
INTRAVENOUS | Status: DC
  Administered 2011-10-18 (×2): via INTRAVENOUS
  Administered 2011-10-19: 125 mL via INTRAVENOUS

## 2011-10-18 MED ORDER — FENTANYL CITRATE 0.05 MG/ML IJ SOLN
100.0000 ug | Freq: Once | INTRAMUSCULAR | Status: AC
  Administered 2011-10-18: 100 ug via INTRAVENOUS
  Filled 2011-10-18: qty 2

## 2011-10-18 MED ORDER — ONDANSETRON HCL 4 MG/2ML IJ SOLN: 4.0000 mg | Freq: Four times a day (QID) | INTRAMUSCULAR | Status: DC | PRN

## 2011-10-18 NOTE — Progress Notes (Signed)
CARE MANAGEMENT NOTE 10/18/2011  Patient:  [name] Ross, [name] Ross   Account Number:  [id number]  Date Initiated:  10/18/2011  Documentation initiated by:  Elya Diloreto  Subjective/Objective Assessment:   pt admitted inot sdu with glucose of 676, and ams     Action/Plan:   lives alone   Anticipated DC Date:  10/21/2011   Anticipated DC Plan:  HOME/SELF CARE  In-house referral  NA      DC Planning Services  NA      [facility] Choice  NA   Choice offered to / List presented to:  NA   DME arranged  NA      DME agency  NA     HH arranged  NA      HH agency  NA   Status of service:  In process, will continue to follow Medicare Important Message given?  NO (If response is "NO", the following Medicare IM given date fields will be blank) Date Medicare IM given:   Date Additional Medicare IM given:    Discharge Disposition:    Per UR Regulation:  Reviewed for med. necessity/level of care/duration of stay  If discussed at Long Length of Stay Meetings, dates discussed:    Comments:  [id number]/Casidee Jann Earlene Plater, RN, BSN, CCM No discharge needs present at time of this review at the sdu/icu level. Case Management [phone]

## 2011-10-18 NOTE — Progress Notes (Addendum)
ANTIBIOTIC CONSULT NOTE - INITIAL  Pharmacy Consult for Zosyn Indication: Gram Negative Bacteremia  No Known Allergies  Patient Measurements: Height: 5\' 2"  (157.5 cm) Weight: 118 lb 13.3 oz (53.9 kg) IBW/kg (Calculated) : 50.1   Vital Signs: Temp: 100.6 F (38.1 C) (05/02 1200) Temp src: Axillary (05/02 1200) BP: 136/67 mmHg (05/02 1200) Pulse Rate: 124  (05/02 1200) Intake/Output from previous day: 05/01 0701 - 05/02 0700 In: 5669.7 [I.V.:5419.7; IV Piggyback:250] Out: 50 [Urine:50] Intake/Output from this shift: Total I/O In: 1486.6 [P.O.:480; I.V.:1006.6] Out: -   Labs:  Basename 10/18/11 0915 10/18/11 0620 10/17/11 2345  WBC -- 6.3 4.4  HGB -- 10.6* 10.5*  PLT -- 120* 117*  LABCREA -- -- --  CREATININE 2.96* 2.98* 3.64*   Estimated Creatinine Clearance: 23.2 ml/min (by C-G formula based on Cr of 2.96). No results found for this basename: VANCOTROUGH:2,VANCOPEAK:2,VANCORANDOM:2,GENTTROUGH:2,GENTPEAK:2,GENTRANDOM:2,TOBRATROUGH:2,TOBRAPEAK:2,TOBRARND:2,AMIKACINPEAK:2,AMIKACINTROU:2,AMIKACIN:2, in the last 72 hours   Microbiology: Recent Results (from the past 720 hour(s))  CULTURE, BLOOD (ROUTINE X 2)     Status: Normal (Preliminary result)   Collection Time   10/18/11 12:34 AM      Component Value Range Status Comment   Specimen Description BLOOD LEFT HAND   Final    Special Requests BOTTLES DRAWN AEROBIC AND ANAEROBIC 4CC   Final    Culture  Setup Time [id number]   Final    Culture     Final    Value: GRAM NEGATIVE RODS     Note: Gram Stain Report Called to,Read Back By and Verified With: PAM WEST 10/18/11 15:00 BY GARRS   Report Status PENDING   Incomplete   MRSA PCR SCREENING     Status: Normal   Collection Time   10/18/11  6:05 AM      Component Value Range Status Comment   MRSA by PCR NEGATIVE  NEGATIVE  Final     Medical History: Past Medical History  Diagnosis Date  . Diabetes mellitus   . Hypothyroidism   . Anemia     Medications:  Scheduled:     . sodium chloride  1,000 mL Intravenous Once  . sodium chloride  1,000 mL Intravenous Once   Followed by  . sodium chloride  1,000 mL Intravenous Once  . acetaminophen      . docusate sodium  100 mg Oral BID  . fentaNYL  100 mcg Intravenous Once  . heparin  5,000 Units Subcutaneous Q8H  . insulin aspart  0-15 Units Subcutaneous TID WC  . insulin glargine  10 Units Subcutaneous QHS  . insulin glargine  10 Units Subcutaneous Once  . levothyroxine  75 mcg Oral Daily  . ondansetron  4 mg Intravenous Once  . polyethylene glycol  17 g Oral Daily  . potassium chloride  10 mEq Intravenous Q1H  . sodium chloride  3 mL Intravenous Q12H  . DISCONTD: cefTRIAXone (ROCEPHIN)  IV  1 g Intravenous Q24H  . DISCONTD: heparin  5,000 Units Subcutaneous Q8H   Infusions:    . sodium chloride 1,000 mL (10/18/11 0107)  . sodium chloride 999 mL/hr at 10/18/11 0407  . dextrose 5 % and 0.45% NaCl 125 mL/hr at 10/18/11 1451  . insulin (NOVOLIN-R) infusion Stopped (10/18/11 1233)  . DISCONTD: sodium chloride 150 mL/hr at 10/18/11 0447  . DISCONTD: sodium chloride    . DISCONTD: insulin (NOVOLIN-R) infusion 5.4 Units/hr (10/18/11 0107)   Assessment: 26 yo female admitted with malaise, N/V now to start Zosyn (Rocephin d/c'd) for GNR in blood culture  x 1.  Goal of Therapy:  Zosyn adjustment per renal function  Plan:  Zosyn 3.375g IV q8 (extended interval infusion) for CrCl > 20 ml/min. Md preference noted for 4.5g IV q6, but it has been shown that extended interval infusions such as the regimen I ordered will provide better results due to the fact that beta lactams on time dependent kill; therefore, a 4hr infusion will be above the MIC longer than a 30 min infusion as the 4.5g IV q6 regimen would be. In addition, 4.5g IV q6 dosing would not be indicated due to the patient's current CrCl being < 40 ml/min at 23 ml/min  Hessie Knows, PharmD, BCPS Pager [phone] 10/18/2011 4:04 PM

## 2011-10-18 NOTE — ED Provider Notes (Addendum)
History     CSN: [id number]  Arrival date & time 10/17/11  2325   First MD Initiated Contact with Patient 10/17/11 2338      Chief Complaint  Patient presents with  . Hyperglycemia  . Fever  . Abdominal Pain    (Consider location/radiation/quality/duration/timing/severity/associated sxs/prior treatment) HPI 26 year old female presents to emergency room complaining of "feeling bad", diffuse abdominal pain, elevated blood sugars and fever. Patient did not know she had fever until she arrived at the emergency room here today. Patient reports history of diabetes. She denies missing any doses of her medications, but reports she is a brittle diabetic and difficult to control. Patient reports she feels like she might be in mild DKA. Sugars at home have been reading high. Patient has had increased urination. LMP was in March. No dysuria. Patient reports she has a history of irregular periods. She has had nausea and vomiting. No cough no URI symptoms no abscesses no recent tattoos or piercings. Past Medical History  Diagnosis Date  . Diabetes mellitus   . Hypothyroidism   . Anemia     Past Surgical History  Procedure Date  . Induced abortion     No family history on file.  History  Substance Use Topics  . Smoking status: Never Smoker   . Smokeless tobacco: Never Used  . Alcohol Use: No    OB History    Grav Para Term Preterm Abortions TAB SAB Ect Mult Living                  Review of Systems  All other systems reviewed and are negative.    Allergies  Review of patient's allergies indicates no known allergies.  Home Medications   Current Outpatient Rx  Name Route Sig Dispense Refill  . INSULIN ASPART 100 UNIT/ML [place] SOLN Subcutaneous Inject 2-12 Units into the skin 3 (three) times daily before meals. Per sliding scale    . INSULIN GLARGINE 100 UNIT/ML  SOLN Subcutaneous Inject 10-15 Units into the skin 2 (two) times daily. 15 in the morning and 10 units at night      . LEVOTHYROXINE SODIUM 75 MCG PO TABS Oral Take 1 tablet (75 mcg total) by mouth daily. 30 tablet 0    BP 133/74  Pulse 132  Temp(Src) 100.9 F (38.3 C) (Oral)  Ht 5\' 2"  (1.575 m)  Wt 115 lb (52.164 kg)  BMI 21.03 kg/m2  SpO2 98%  LMP 08/20/2011  Physical Exam  Nursing note and vitals reviewed. Constitutional: She is oriented to person, place, and time. She appears well-developed and well-nourished.  HENT:  Head: Normocephalic and atraumatic.  Right Ear: External ear normal.  Left Ear: External ear normal.  Nose: Nose normal.  Mouth/Throat: No oropharyngeal exudate.       Dry mucous membranes  Eyes: Conjunctivae and EOM are normal. Pupils are equal, round, and reactive to light.  Neck: Normal range of motion. Neck supple. No JVD present. No tracheal deviation present. No thyromegaly present.  Cardiovascular: Regular rhythm, normal heart sounds and intact distal pulses.  Exam reveals no gallop and no friction rub.   No murmur heard.      Tachycardia noted  Pulmonary/Chest: Effort normal and breath sounds normal. No stridor. No respiratory distress. She has no wheezes. She has no rales. She exhibits no tenderness.  Abdominal: Soft. She exhibits no distension and no mass. There is tenderness (mild diffuse tenderness). There is no rebound and no guarding.  Hyperactive bowel sounds  Musculoskeletal: Normal range of motion. She exhibits no edema and no tenderness.  Lymphadenopathy:    She has no cervical adenopathy.  Neurological: She is alert and oriented to person, place, and time. She exhibits normal muscle tone. Coordination normal.  Skin: Skin is warm and dry. No rash noted. No erythema. No pallor.    ED Course  Procedures (including critical care time)  Labs Reviewed  CBC - Abnormal; Notable for the following:    RBC 3.35 (*)    Hemoglobin 10.5 (*)    HCT 30.8 (*)    Platelets 117 (*)    All other components within normal limits  DIFFERENTIAL - Abnormal; Notable  for the following:    Neutrophils Relative 90 (*)    Lymphocytes Relative 7 (*)    Lymphs Abs 0.3 (*)    All other components within normal limits  COMPREHENSIVE METABOLIC PANEL - Abnormal; Notable for the following:    Sodium 121 (*)    Chloride 80 (*)    CO2 15 (*)    Glucose, Bld 767 (*)    BUN 53 (*)    Creatinine, Ser 3.64 (*)    Calcium 8.1 (*)    Albumin 2.8 (*)    AST 63 (*)    ALT 42 (*)    Alkaline Phosphatase 127 (*)    GFR calc non Af Amer 16 (*)    GFR calc Af Amer 19 (*)    All other components within normal limits  LIPASE, BLOOD - Abnormal; Notable for the following:    Lipase 9 (*)    All other components within normal limits  URINALYSIS, ROUTINE W REFLEX MICROSCOPIC - Abnormal; Notable for the following:    APPearance CLOUDY (*)    Glucose, UA >1000 (*)    Hgb urine dipstick SMALL (*)    Ketones, ur 40 (*)    Protein, ur 100 (*)    All other components within normal limits  BLOOD GAS, VENOUS - Abnormal; Notable for the following:    pH, Ven 7.328 (*)    pCO2, Ven 31.7 (*)    pO2, Ven 58.5 (*)    Bicarbonate 16.2 (*)    Acid-base deficit 8.4 (*)    All other components within normal limits  GLUCOSE, CAPILLARY - Abnormal; Notable for the following:    Glucose-Capillary >600 (*)    All other components within normal limits  KETONES, QUALITATIVE - Abnormal; Notable for the following:    Acetone, Bld MODERATE (*)    All other components within normal limits  URINE MICROSCOPIC-ADD ON - Abnormal; Notable for the following:    Squamous Epithelial / LPF FEW (*)    Bacteria, UA MANY (*)    All other components within normal limits  GLUCOSE, CAPILLARY - Abnormal; Notable for the following:    Glucose-Capillary 576 (*)    All other components within normal limits  LACTIC ACID, PLASMA  POCT PREGNANCY, URINE  URINE CULTURE  CULTURE, BLOOD (ROUTINE X 2)  CULTURE, BLOOD (ROUTINE X 2)   Dg Chest 2 View  10/18/2011  *RADIOLOGY REPORT*  Clinical Data: Fever,  concern for sepsis  CHEST - 2 VIEW  Comparison: None.  Findings: Normal cardiac silhouette and mediastinal contours given decreased lung volumes.  Minimal bilateral infrahilar linear heterogeneous opacities favored to represent subsegmental atelectasis.  There is mild elevation of the right hemidiaphragm. No definite pleural effusion or pneumothorax.  No acute osseous abnormalities.  IMPRESSION: Decreased lung volumes  with bilateral infrahilar linear opacities favored to represent subsegmental atelectasis.  No focal airspace opacities to suggest pneumonia.  Original Report Authenticated By: Waynard Reeds, M.D.   CRITICAL CARE Performed by: Olivia Mackie   Total critical care time:56 min  Critical care time was exclusive of separately billable procedures and treating other patients.  Critical care was necessary to treat or prevent imminent or life-threatening deterioration.  Critical care was time spent personally by me on the following activities: development of treatment plan with patient and/or surrogate as well as nursing, discussions with consultants, evaluation of patient's response to treatment, examination of patient, obtaining history from patient or surrogate, ordering and performing treatments and interventions, ordering and review of laboratory studies, ordering and review of radiographic studies, pulse oximetry and re-evaluation of patient's condition.  1. DKA, type 1   2. Fever   3. Renal failure       MDM  26 year old female with concern for DKA given symptoms. Patient with fever, do not see source on exam. We'll check urine, chest x-ray. Will start patient on IV fluids and Glucomander. Expect she may need admission if she is in dka        Olivia Mackie, MD 10/18/11 1191  Olivia Mackie, MD 10/18/11 [phone]

## 2011-10-18 NOTE — Progress Notes (Signed)
CRITICAL VALUE ALERT  Critical value received: + blood culture Date of notification:  10/18/2011  Time of notification: 1515 Critical value read back: yes  Nurse who received alert: Redgie Grayer RN  MD notified (1st page): Cena Benton  Time of first page: 1520 MD notified (2nd page):  Time of second page:  Responding MD:  Cena Benton  Time MD responded: [phone]

## 2011-10-18 NOTE — Progress Notes (Signed)
Was by nurse that indicated that patient had grown Gram negative rods in blood culture.  Up to date suggests that in patients that are not immunocompromised and do not have severe sepsis that monotherapy with Zosyn would be adequate coverage until sensitivities are available.  At this point will go ahead and d/c rocephin and place patient on Zosyn.  Will plan on narrowing abx coverage once patient's U/C comes back.    The most likely source is UTI given that UA was cloudy with many bacteria.  Likely organism E. Coli or proteus mirabilis.  Low index of suspicion for salmonella or Pseudomonas but will continue to monitor closely.

## 2011-10-18 NOTE — ED Notes (Signed)
Unable to get morning labs. RN aware.

## 2011-10-18 NOTE — H&P (Signed)
PCP:   Norberto Sorenson, MD, MD   Chief Complaint: Feeling malaise, nausea, vomiting.   HPI: [name] Ross is an 26 y.o. female with history of type 1 diabetes, hypothyroidism, presents to [facility] long emergency room with 1 week history of progressive nausea, decrease in by mouth intake, and not taken her insulin because she has not been able to eat. She had been diagnosed with type 1 diabetes as a youngster and had been in DKA previously. She denied any cough, chest pain, shortness of breath, headache, dysuria, fever or chills. Evaluation in the emergency room showed venous pH of 7.32, bicarbonate 15, normal potassium, and concomitant serum sodium of 121. She was noted to have elevated BUN to 53 and creatinine of 3.6. Her chest x-ray did not show any infiltrate, and a urinalysis show only 3-6 WBC, but with bacteria. Her lactic acid was normal at 1.4 and her lipase was normal as well. She did have some fever, but normal white count and no definite source of infection. She was given intravenous fluid and started on a Glucomander, and hospitalist was asked to admit this patient for DKA. Her pregnancy test is negative  Rewiew of Systems:  The patient denies anorexia, fever, weight loss,, vision loss, decreased hearing, hoarseness, chest pain, syncope, dyspnea on exertion, peripheral edema, balance deficits, hemoptysis, melena, hematochezia, severe indigestion/heartburn, hematuria, incontinence, genital sores, muscle weakness, suspicious skin lesions, transient blindness, difficulty walking, depression, unusual weight change, abnormal bleeding, enlarged lymph nodes, angioedema, and breast masses.    Past Medical History  Diagnosis Date  . Diabetes mellitus   . Hypothyroidism   . Anemia     Past Surgical History  Procedure Date  . Induced abortion     Medications:  HOME MEDS: Prior to Admission medications   Medication Sig Start Date End Date Taking? Authorizing Provider  insulin aspart  (NOVOLOG) 100 UNIT/ML injection Inject 2-12 Units into the skin 3 (three) times daily before meals. Per sliding scale   Yes Historical Provider, MD  insulin glargine (LANTUS) 100 UNIT/ML injection Inject 10-15 Units into the skin 2 (two) times daily. 15 in the morning and 10 units at night   Yes Historical Provider, MD  levothyroxine (SYNTHROID, LEVOTHROID) 75 MCG tablet Take 1 tablet (75 mcg total) by mouth daily. 08/22/11  Yes Alison Murray, MD     Allergies:  No Known Allergies  Social History:   reports that she has never smoked. She has never used smokeless tobacco. She reports that she does not drink alcohol or use illicit drugs.  Family History: No family history on file.   Physical Exam: Filed Vitals:   10/17/11 2332 10/18/11 0106 10/18/11 0312  BP: 133/74  98/56  Pulse: 132  90  Temp: 102.3 F (39.1 C) 100.9 F (38.3 C) 98.6 F (37 C)  TempSrc: Oral Oral Oral  Resp:   16  Height: 5\' 2"  (1.575 m)    Weight: 52.164 kg (115 lb)    SpO2: 98%  98%   Blood pressure 98/56, pulse 90, temperature 98.6 F (37 C), temperature source Oral, resp. rate 16, height 5\' 2"  (1.575 m), weight 52.164 kg (115 lb), last menstrual period 08/20/2011, SpO2 98.00%.  GEN:  Pleasant  person lying in the stretcher in no acute distress; cooperative with exam. She is lethargic but arousable easily PSYCH:  alert and oriented x4; does not appear anxious or depressed; affect is appropriate. HEENT: Mucous membranes pink and anicteric; PERRLA; EOM intact; no cervical lymphadenopathy nor thyromegaly or  carotid bruit; no JVD; Breasts:: Not examined CHEST WALL: No tenderness CHEST: Normal respiration, clear to auscultation bilaterally HEART: Regular rate and rhythm; no murmurs rubs or gallops BACK: No kyphosis or scoliosis; no CVA tenderness ABDOMEN: Obese, soft non-tender; no masses, no organomegaly, normal abdominal bowel sounds; no pannus; no intertriginous candida. Rectal Exam: Not done EXTREMITIES:  No bone or joint deformity; age-appropriate arthropathy of the hands and knees; no edema; no ulcerations. Genitalia: not examined PULSES: 2+ and symmetric SKIN: Normal hydration no rash or ulceration CNS: Cranial nerves 2-12 grossly intact no focal lateralizing neurologic deficit   Labs & Imaging Results for orders placed during the hospital encounter of 10/17/11 (from the past 48 hour(s))  CBC     Status: Abnormal   Collection Time   10/17/11 11:45 PM      Component Value Range Comment   WBC 4.4  4.0 - 10.5 (K/uL)    RBC 3.35 (*) 3.87 - 5.11 (MIL/uL)    Hemoglobin 10.5 (*) 12.0 - 15.0 (g/dL)    HCT 29.5 (*) 62.1 - 46.0 (%)    MCV 91.9  78.0 - 100.0 (fL)    MCH 31.3  26.0 - 34.0 (pg)    MCHC 34.1  30.0 - 36.0 (g/dL)    RDW 30.8  65.7 - 84.6 (%)    Platelets 117 (*) 150 - 400 (K/uL)   DIFFERENTIAL     Status: Abnormal   Collection Time   10/17/11 11:45 PM      Component Value Range Comment   Neutrophils Relative 90 (*) 43 - 77 (%)    Lymphocytes Relative 7 (*) 12 - 46 (%)    Monocytes Relative 3  3 - 12 (%)    Eosinophils Relative 0  0 - 5 (%)    Basophils Relative 0  0 - 1 (%)    Neutro Abs 4.0  1.7 - 7.7 (K/uL)    Lymphs Abs 0.3 (*) 0.7 - 4.0 (K/uL)    Monocytes Absolute 0.1  0.1 - 1.0 (K/uL)    Eosinophils Absolute 0.0  0.0 - 0.7 (K/uL)    Basophils Absolute 0.0  0.0 - 0.1 (K/uL)    WBC Morphology TOXIC GRANULATION      Smear Review PLATELET COUNT CONFIRMED BY SMEAR     COMPREHENSIVE METABOLIC PANEL     Status: Abnormal   Collection Time   10/17/11 11:45 PM      Component Value Range Comment   Sodium 121 (*) 135 - 145 (mEq/L)    Potassium 4.2  3.5 - 5.1 (mEq/L)    Chloride 80 (*) 96 - 112 (mEq/L)    CO2 15 (*) 19 - 32 (mEq/L)    Glucose, Bld 767 (*) 70 - 99 (mg/dL)    BUN 53 (*) 6 - 23 (mg/dL)    Creatinine, Ser 9.62 (*) 0.50 - 1.10 (mg/dL)    Calcium 8.1 (*) 8.4 - 10.5 (mg/dL)    Total Protein 7.3  6.0 - 8.3 (g/dL)    Albumin 2.8 (*) 3.5 - 5.2 (g/dL)    AST 63 (*) 0 -  37 (U/L)    ALT 42 (*) 0 - 35 (U/L)    Alkaline Phosphatase 127 (*) 39 - 117 (U/L)    Total Bilirubin 0.7  0.3 - 1.2 (mg/dL)    GFR calc non Af Amer 16 (*) >90 (mL/min)    GFR calc Af Amer 19 (*) >90 (mL/min)   LIPASE, BLOOD     Status: Abnormal  Collection Time   10/17/11 11:45 PM      Component Value Range Comment   Lipase 9 (*) 11 - 59 (U/L)   GLUCOSE, CAPILLARY     Status: Abnormal   Collection Time   10/18/11 12:23 AM      Component Value Range Comment   Glucose-Capillary >600 (*) 70 - 99 (mg/dL)    Comment 1 Notify RN     BLOOD GAS, VENOUS     Status: Abnormal   Collection Time   10/18/11 12:30 AM      Component Value Range Comment   O2 Content ROOM AIR      Delivery systems ROOM AIR      pH, Ven 7.328 (*) 7.250 - 7.300     pCO2, Ven 31.7 (*) 45.0 - 50.0 (mmHg)    pO2, Ven 58.5 (*) 30.0 - 45.0 (mmHg)    Bicarbonate 16.2 (*) 20.0 - 24.0 (mEq/L)    TCO2 15.2  0 - 100 (mmol/L)    Acid-base deficit 8.4 (*) 0.0 - 2.0 (mmol/L)    O2 Saturation 84.3      Patient temperature 98.6      Collection site VEIN      Drawn by COLLECTED BY NURSE      Sample type VEIN     LACTIC ACID, PLASMA     Status: Normal   Collection Time   10/18/11 12:34 AM      Component Value Range Comment   Lactic Acid, Venous 1.4  0.5 - 2.2 (mmol/L)   URINALYSIS, ROUTINE W REFLEX MICROSCOPIC     Status: Abnormal   Collection Time   10/18/11  1:00 AM      Component Value Range Comment   Color, Urine YELLOW  YELLOW     APPearance CLOUDY (*) CLEAR     Specific Gravity, Urine 1.027  1.005 - 1.030     pH 6.0  5.0 - 8.0     Glucose, UA >1000 (*) NEGATIVE (mg/dL)    Hgb urine dipstick SMALL (*) NEGATIVE     Bilirubin Urine NEGATIVE  NEGATIVE     Ketones, ur 40 (*) NEGATIVE (mg/dL)    Protein, ur 578 (*) NEGATIVE (mg/dL)    Urobilinogen, UA 0.2  0.0 - 1.0 (mg/dL)    Nitrite NEGATIVE  NEGATIVE     Leukocytes, UA NEGATIVE  NEGATIVE    URINE MICROSCOPIC-ADD ON     Status: Abnormal   Collection Time   10/18/11  1:00  AM      Component Value Range Comment   Squamous Epithelial / LPF FEW (*) RARE     WBC, UA 3-6  <3 (WBC/hpf)    RBC / HPF 0-2  <3 (RBC/hpf)    Bacteria, UA MANY (*) RARE    POCT PREGNANCY, URINE     Status: Normal   Collection Time   10/18/11  1:06 AM      Component Value Range Comment   Preg Test, Ur NEGATIVE  NEGATIVE    KETONES, QUALITATIVE     Status: Abnormal   Collection Time   10/18/11  1:08 AM      Component Value Range Comment   Acetone, Bld MODERATE (*) NEGATIVE    GLUCOSE, CAPILLARY     Status: Abnormal   Collection Time   10/18/11  2:03 AM      Component Value Range Comment   Glucose-Capillary 576 (*) 70 - 99 (mg/dL)    Comment 1 Documented in Chart  GLUCOSE, CAPILLARY     Status: Abnormal   Collection Time   10/18/11  3:11 AM      Component Value Range Comment   Glucose-Capillary 420 (*) 70 - 99 (mg/dL)    Comment 1 Notify RN      Dg Chest 2 View  10/18/2011  *RADIOLOGY REPORT*  Clinical Data: Fever, concern for sepsis  CHEST - 2 VIEW  Comparison: None.  Findings: Normal cardiac silhouette and mediastinal contours given decreased lung volumes.  Minimal bilateral infrahilar linear heterogeneous opacities favored to represent subsegmental atelectasis.  There is mild elevation of the right hemidiaphragm. No definite pleural effusion or pneumothorax.  No acute osseous abnormalities.  IMPRESSION: Decreased lung volumes with bilateral infrahilar linear opacities favored to represent subsegmental atelectasis.  No focal airspace opacities to suggest pneumonia.  Original Report Authenticated By: Waynard Reeds, M.D.      Assessment Present on Admission:  .DKA, type 1 .Hypothyroidism .Abdominal pain .Volume depletion UTI ARF  PLAN:  This patient is in DKA. We'll admit her to the SDU. Although she does not have definite source of infection, and normal white count, I am concerned about the toxic granulation in the differential, along with her borderline urinalysis. We'll treat  her with Rocephin. Would use the Glucomander for DKA.  Her elevated creatinine is likely from volume depletion, and I suspect that it would correct nicely with IV fluid. She is stable, full code, and will be admitted to [facility] hospitalist service.   Other plans as per orders.  Bennie Scaff 10/18/2011, 3:50 AM

## 2011-10-18 NOTE — Progress Notes (Addendum)
Subjective: Pt mentions that she feels a little better.  Denies any difficulty breathing, dysuria, abdominal discomfort, nausea, or chills.  States that prior to her admission her boyfriend thought she had tactile fever. She denies any focal areas of infection.  Objective: Filed Vitals:   10/17/11 2332 10/18/11 0106 10/18/11 0312 10/18/11 0552  BP: 133/74  98/56 107/72  Pulse: 132  90 96  Temp: 102.3 F (39.1 C) 100.9 F (38.3 C) 98.6 F (37 C) 98.2 F (36.8 C)  TempSrc: Oral Oral Oral Oral  Resp:   16 22  Height: 5\' 2"  (1.575 m)   5\' 2"  (1.575 m)  Weight: 52.164 kg (115 lb)   53.9 kg (118 lb 13.3 oz)  SpO2: 98%  98% 99%   Weight change:   Intake/Output Summary (Last 24 hours) at 10/18/11 0810 Last data filed at 10/18/11 1610  Gross per 24 hour  Intake 5539.34 ml  Output     50 ml  Net 5489.34 ml    General: Alert, awake, oriented x3, in no acute distress.  HEENT: No bruits, no goiter.  Heart: Regular rate and rhythm, without murmurs, rubs, gallops.  Lungs: Cta BL, bilateral air movement.  Abdomen: Soft, nondistended, positive bowel sounds. + suprapubic discomfort and generalized discomfort on deep palpation, No rebound tenderness, no voluntary or involuntary guarding. Neuro: Grossly intact, nonfocal.   Lab Results:  [facility] 10/18/11 0620 10/17/11 2345  NA 132* 121*  K 4.2 4.2  CL 101 80*  CO2 18* 15*  GLUCOSE 201* 767*  BUN 43* 53*  CREATININE 2.98* 3.64*  CALCIUM 7.3* 8.1*  MG -- --  PHOS -- --    Basename 10/17/11 2345  AST 63*  ALT 42*  ALKPHOS 127*  BILITOT 0.7  PROT 7.3  ALBUMIN 2.8*    Basename 10/17/11 2345  LIPASE 9*  AMYLASE --    Basename 10/18/11 0620 10/17/11 2345  WBC 6.3 4.4  NEUTROABS -- 4.0  HGB 10.6* 10.5*  HCT 29.9* 30.8*  MCV 87.2 91.9  PLT 120* 117*   No results found for this basename: CKTOTAL:3,CKMB:3,CKMBINDEX:3,TROPONINI:3 in the last 72 hours No components found with this basename: POCBNP:3 No results found for  this basename: DDIMER:2 in the last 72 hours No results found for this basename: HGBA1C:2 in the last 72 hours No results found for this basename: CHOL:2,HDL:2,LDLCALC:2,TRIG:2,CHOLHDL:2,LDLDIRECT:2 in the last 72 hours No results found for this basename: TSH,T4TOTAL,FREET3,T3FREE,THYROIDAB in the last 72 hours No results found for this basename: VITAMINB12:2,FOLATE:2,FERRITIN:2,TIBC:2,IRON:2,RETICCTPCT:2 in the last 72 hours  Micro Results: No results found for this or any previous visit (from the past 240 hour(s)).  Studies/Results: Dg Chest 2 View  10/18/2011  *RADIOLOGY REPORT*  Clinical Data: Fever, concern for sepsis  CHEST - 2 VIEW  Comparison: None.  Findings: Normal cardiac silhouette and mediastinal contours given decreased lung volumes.  Minimal bilateral infrahilar linear heterogeneous opacities favored to represent subsegmental atelectasis.  There is mild elevation of the right hemidiaphragm. No definite pleural effusion or pneumothorax.  No acute osseous abnormalities.  IMPRESSION: Decreased lung volumes with bilateral infrahilar linear opacities favored to represent subsegmental atelectasis.  No focal airspace opacities to suggest pneumonia.  Original Report Authenticated By: Waynard Reeds, M.D.    Medications: I have reviewed the patient's current medications.   Patient Active Hospital Problem List: DKA, type 1 (08/21/2011) -Agree with Dr. Marigene Ehlers plan.  Will plan on transitioning of the glucostabilizer once patient meets criteria and at that juncture will place on insulin  sliding scale with home regimen of Lantus.  Patient mentions that she takes 10-15 Units of Lantus.  Abdominal pain (08/21/2011) Likely secondary to UTI.  Pt is currently on Rocephin. This may have contributed to primary problem listed above.  Hypothyroidism (08/21/2011) Stable at this juncture will continue home regimen.   Volume depletion (10/18/2011) Secondary to DKA, will plan on continuing IVF rehydration.       LOS: 1 day   [name] Pia M.D.  [facility] 10/18/2011, 8:10 AM  Addendum: Spoke with nursing and they indicate that patient has had > 2 blood sugar readings below 250 more than 4 hours apart.  Anion gap has normalized.  Will place orders for ISS and home Lantus dose.  Glucose stabilizer should be discontinued per protocol as discussed with nurse.  Abdominal discomfort:  Patient mentions that she has some abdominal discomfort.  This has been going on since Saturday reportedly.  She reports less than 3 bowel movements per week and states "I feel constipated."  Also endorses straining.  Pain reportedly was not worsened with food.  Denies any dysuria.  A/P: Abdominal discomfort:  More likely due to constipation.  Thus will encourage po intake, miralax and obtain an abdominal x ray.  Initially treated pain with tramadol but I am considering changing pain regimen to acetaminophen.

## 2011-10-19 DIAGNOSIS — E1010 Type 1 diabetes mellitus with ketoacidosis without coma: Secondary | ICD-10-CM

## 2011-10-19 DIAGNOSIS — R7881 Bacteremia: Secondary | ICD-10-CM | POA: Diagnosis present

## 2011-10-19 DIAGNOSIS — E860 Dehydration: Secondary | ICD-10-CM

## 2011-10-19 DIAGNOSIS — E782 Mixed hyperlipidemia: Secondary | ICD-10-CM

## 2011-10-19 DIAGNOSIS — N179 Acute kidney failure, unspecified: Secondary | ICD-10-CM

## 2011-10-19 LAB — CBC
MCHC: 33.9 g/dL (ref 30.0–36.0)
MCV: 89.1 fL (ref 78.0–100.0)
Platelets: 99 10*3/uL — ABNORMAL LOW (ref 150–400)
RDW: 13.6 % (ref 11.5–15.5)
WBC: 6.8 10*3/uL (ref 4.0–10.5)

## 2011-10-19 LAB — GLUCOSE, CAPILLARY
Glucose-Capillary: 266 mg/dL — ABNORMAL HIGH (ref 70–99)
Glucose-Capillary: 200 mg/dL — ABNORMAL HIGH (ref 70–99)
Glucose-Capillary: 249 mg/dL — ABNORMAL HIGH (ref 70–99)

## 2011-10-19 LAB — URINE CULTURE

## 2011-10-19 MED ORDER — SODIUM CHLORIDE 0.9 % IV SOLN: INTRAVENOUS | Status: DC

## 2011-10-19 MED ORDER — SODIUM CHLORIDE 0.9 % IV SOLN
INTRAVENOUS | Status: DC
  Administered 2011-10-19: 15:00:00 via INTRAVENOUS
  Administered 2011-10-19: 125 mL via INTRAVENOUS
  Administered 2011-10-19: 500 mL via INTRAVENOUS

## 2011-10-19 MED ORDER — SODIUM CHLORIDE 0.9 % IV BOLUS (SEPSIS)
500.0000 mL | Freq: Once | INTRAVENOUS | Status: AC
  Administered 2011-10-19: 500 mL via INTRAVENOUS

## 2011-10-19 MED ORDER — VANCOMYCIN HCL 1000 MG IV SOLR
750.0000 mg | INTRAVENOUS | Status: DC
  Administered 2011-10-19 – 2011-10-20 (×2): 750 mg via INTRAVENOUS
  Filled 2011-10-19 (×2): qty 750

## 2011-10-19 MED ORDER — SODIUM CHLORIDE 0.9 % IV BOLUS (SEPSIS): 500.0000 mL | Freq: Once | INTRAVENOUS | Status: AC

## 2011-10-19 MED ORDER — NAPROXEN 250 MG PO TABS
250.0000 mg | ORAL_TABLET | Freq: Two times a day (BID) | ORAL | Status: DC | PRN
  Filled 2011-10-19: qty 1

## 2011-10-19 NOTE — Progress Notes (Signed)
CSW attempted to meet with Pt to assess needs and offer support, Pt declined meeting with CSW. Has very flat affect. Reports she has a CSW at her doctor's office, but could not remember name of CSW. CSW respected Pt's wishes, but stated she was available for support and assistance. Pt agrees that CSW can attempt to see again at later date to reassess. CSW to follow.  Vennie Homans, [place] 10/19/2011 11:19 AM [phone]

## 2011-10-19 NOTE — Progress Notes (Signed)
ANTIBIOTIC CONSULT NOTE - INITIAL  Pharmacy Consult for Zosyn & Vancomycin Indication: E.coli bacteremia & bacteruria  No Known Allergies  Patient Measurements: Height: 5\' 2"  (157.5 cm) Weight: 127 lb 6.8 oz (57.8 kg) IBW/kg (Calculated) : 50.1   Vital Signs: Temp: 99.8 F (37.7 C) (05/03 0800) Temp src: Oral (05/03 0800) BP: 115/65 mmHg (05/03 1000) Pulse Rate: 134  (05/03 1000) Intake/Output from previous day: 05/02 0701 - 05/03 0700 In: 3816.6 [P.O.:960; I.V.:2756.6; IV Piggyback:100] Out: 200 [Urine:200] Intake/Output from this shift: Total I/O In: 5702.1 [I.V.:4235.4; IV Piggyback:1466.7] Out: 450 [Urine:450]  Labs:  [facility] 10/19/11 0847 10/18/11 0915 10/18/11 0620 10/17/11 2345  WBC 6.8 -- 6.3 4.4  HGB 10.2* -- 10.6* 10.5*  PLT 99* -- 120* 117*  LABCREA -- -- -- --  CREATININE -- 2.96* 2.98* 3.64*   Estimated Creatinine Clearance: 23.2 ml/min (by C-G formula based on Cr of 2.96). No results found for this basename: VANCOTROUGH:2,VANCOPEAK:2,VANCORANDOM:2,GENTTROUGH:2,GENTPEAK:2,GENTRANDOM:2,TOBRATROUGH:2,TOBRAPEAK:2,TOBRARND:2,AMIKACINPEAK:2,AMIKACINTROU:2,AMIKACIN:2, in the last 72 hours   Microbiology: Recent Results (from the past 720 hour(s))  CULTURE, BLOOD (ROUTINE X 2)     Status: Normal (Preliminary result)   Collection Time   10/18/11 12:29 AM      Component Value Range Status Comment   Specimen Description BLOOD LEFT ANTECUBITAL   Final    Special Requests BOTTLES DRAWN AEROBIC AND ANAEROBIC 5CC   Final    Culture  Setup Time [id number]   Final    Culture     Final    Value: GRAM NEGATIVE RODS     Note: Gram Stain Report Called to,Read Back By and Verified With: PAM WEST @ 1825 ON 10/18/11 BY GOLLD   Report Status PENDING   Incomplete   CULTURE, BLOOD (ROUTINE X 2)     Status: Normal (Preliminary result)   Collection Time   10/18/11 12:34 AM      Component Value Range Status Comment   Specimen Description BLOOD LEFT HAND   Final    Special  Requests BOTTLES DRAWN AEROBIC AND ANAEROBIC 4CC   Final    Culture  Setup Time [id number]   Final    Culture     Final    Value: ESCHERICHIA COLI     Note: Gram Stain Report Called to,Read Back By and Verified With: PAM WEST 10/18/11 15:00 BY GARRS   Report Status PENDING   Incomplete   URINE CULTURE     Status: Normal (Preliminary result)   Collection Time   10/18/11  1:00 AM      Component Value Range Status Comment   Specimen Description URINE, CLEAN CATCH   Final    Special Requests NONE   Final    Culture  Setup Time [id number]   Final    Colony Count >=100,000 COLONIES/ML   Final    Culture ESCHERICHIA COLI   Final    Report Status PENDING   Incomplete   MRSA PCR SCREENING     Status: Normal   Collection Time   10/18/11  6:05 AM      Component Value Range Status Comment   MRSA by PCR NEGATIVE  NEGATIVE  Final     Medical History: Past Medical History  Diagnosis Date  . Diabetes mellitus   . Hypothyroidism   . Anemia     Medications:  Scheduled:     . docusate sodium  100 mg Oral BID  . heparin  5,000 Units Subcutaneous Q8H  . insulin aspart  0-15 Units Subcutaneous Q4H  .  insulin glargine  10 Units Subcutaneous QHS  . insulin glargine  10 Units Subcutaneous Once  . levothyroxine  75 mcg Oral Daily  . piperacillin-tazobactam (ZOSYN)  IV  3.375 g Intravenous Q8H  . polyethylene glycol  17 g Oral Daily  . sodium chloride  500 mL Intravenous Once  . sodium chloride  500 mL Intravenous Once  . sodium chloride  3 mL Intravenous Q12H  . DISCONTD: cefTRIAXone (ROCEPHIN)  IV  1 g Intravenous Q24H  . DISCONTD: insulin aspart  0-15 Units Subcutaneous TID WC   Infusions:     . sodium chloride 500 mL (10/19/11 1000)  . DISCONTD: sodium chloride 500 mL (10/19/11 0815)  . DISCONTD: sodium chloride    . DISCONTD: dextrose 5 % and 0.45% NaCl 125 mL (10/19/11 0701)  . DISCONTD: insulin (NOVOLIN-R) infusion Stopped (10/18/11 1233)   Assessment:  26 yo female admitted  with malaise, N/V.  E.coli growing in blood and urine cultures. Sensitivities pending.  Day #2 Zosyn 3.375g IV Q8H infused over 4hrs.  Adding Vancomycin today.   As of yesterday, SCr elevated but improving. CrCl ~23, 33N.  Goal of Therapy:  Zosyn adjustment per renal function Vancomycin trough 15-20mg /l  Plan:   Continue Zosyn 3.375g IV Q8H each dose infused over 4hrs (shown to be superior to conventional dosing) - this is the recommended dosage for all indications when CrCl >20 per [facility] policy.   Start Vanc 750mg  IV q24h  F/u daily  Charolotte Eke, PharmD, pager [phone]. 10/19/2011,11:21 AM.

## 2011-10-19 NOTE — Progress Notes (Signed)
Subjective: Patient is reporting that she feels better today.  Stated that she was having abdominal discomfort yesterday but after BM she feels better today.  No acute issues overnight reported.  Objective: Filed Vitals:   10/19/11 0500 10/19/11 0600 10/19/11 0700 10/19/11 0800  BP:    95/49  Pulse: 113  122   Temp:    99.8 F (37.7 C)  TempSrc:    Oral  Resp: 18 20 15    Height:      Weight:      SpO2: 100%  100%    Weight change: 5.636 kg (12 lb 6.8 oz)  Intake/Output Summary (Last 24 hours) at 10/19/11 1610 Last data filed at 10/19/11 0900  Gross per 24 hour  Intake 8139.79 ml  Output    650 ml  Net 7489.79 ml    General: Alert, awake, oriented x3, in no acute distress.  HEENT: No bruits, no goiter.  Heart: Regular rate and rhythm, without murmurs, rubs, gallops.  Lungs: Clear to auscultation, bilateral air movement.  Abdomen: Soft, nontender, nondistended, positive bowel sounds.  Neuro: Grossly intact, nonfocal.   Lab Results:  [facility] 10/18/11 0915 10/18/11 0620  NA 134* 132*  K 4.2 4.2  CL 104 101  CO2 19 18*  GLUCOSE 101* 201*  BUN 43* 43*  CREATININE 2.96* 2.98*  CALCIUM 7.1* 7.3*  MG -- --  PHOS -- --    Basename 10/17/11 2345  AST 63*  ALT 42*  ALKPHOS 127*  BILITOT 0.7  PROT 7.3  ALBUMIN 2.8*    Basename 10/17/11 2345  LIPASE 9*  AMYLASE --    Basename 10/19/11 0847 10/18/11 0620 10/17/11 2345  WBC 6.8 6.3 --  NEUTROABS -- -- 4.0  HGB 10.2* 10.6* --  HCT 30.1* 29.9* --  MCV 89.1 87.2 --  PLT 99* 120* --   No results found for this basename: CKTOTAL:3,CKMB:3,CKMBINDEX:3,TROPONINI:3 in the last 72 hours No components found with this basename: POCBNP:3 No results found for this basename: DDIMER:2 in the last 72 hours  Basename 10/17/11 2345  HGBA1C 11.9*   No results found for this basename: CHOL:2,HDL:2,LDLCALC:2,TRIG:2,CHOLHDL:2,LDLDIRECT:2 in the last 72 hours  Basename 10/18/11 0620  TSH 2.096  T4TOTAL --  T3FREE --    THYROIDAB --   No results found for this basename: VITAMINB12:2,FOLATE:2,FERRITIN:2,TIBC:2,IRON:2,RETICCTPCT:2 in the last 72 hours  Micro Results: Recent Results (from the past 240 hour(s))  CULTURE, BLOOD (ROUTINE X 2)     Status: Normal (Preliminary result)   Collection Time   10/18/11 12:29 AM      Component Value Range Status Comment   Specimen Description BLOOD LEFT ANTECUBITAL   Final    Special Requests BOTTLES DRAWN AEROBIC AND ANAEROBIC 5CC   Final    Culture  Setup Time [id number]   Final    Culture     Final    Value: GRAM NEGATIVE RODS     Note: Gram Stain Report Called to,Read Back By and Verified With: PAM WEST @ 1825 ON 10/18/11 BY GOLLD   Report Status PENDING   Incomplete   CULTURE, BLOOD (ROUTINE X 2)     Status: Normal (Preliminary result)   Collection Time   10/18/11 12:34 AM      Component Value Range Status Comment   Specimen Description BLOOD LEFT HAND   Final    Special Requests BOTTLES DRAWN AEROBIC AND ANAEROBIC 4CC   Final    Culture  Setup Time [id number]   Final    Culture  Final    Value: ESCHERICHIA COLI     Note: Gram Stain Report Called to,Read Back By and Verified With: PAM WEST 10/18/11 15:00 BY GARRS   Report Status PENDING   Incomplete   URINE CULTURE     Status: Normal (Preliminary result)   Collection Time   10/18/11  1:00 AM      Component Value Range Status Comment   Specimen Description URINE, CLEAN CATCH   Final    Special Requests NONE   Final    Culture  Setup Time [id number]   Final    Colony Count >=100,000 COLONIES/ML   Final    Culture ESCHERICHIA COLI   Final    Report Status PENDING   Incomplete   MRSA PCR SCREENING     Status: Normal   Collection Time   10/18/11  6:05 AM      Component Value Range Status Comment   MRSA by PCR NEGATIVE  NEGATIVE  Final     Studies/Results: Dg Chest 2 View  10/18/2011  *RADIOLOGY REPORT*  Clinical Data: Fever, concern for sepsis  CHEST - 2 VIEW  Comparison: None.  Findings: Normal  cardiac silhouette and mediastinal contours given decreased lung volumes.  Minimal bilateral infrahilar linear heterogeneous opacities favored to represent subsegmental atelectasis.  There is mild elevation of the right hemidiaphragm. No definite pleural effusion or pneumothorax.  No acute osseous abnormalities.  IMPRESSION: Decreased lung volumes with bilateral infrahilar linear opacities favored to represent subsegmental atelectasis.  No focal airspace opacities to suggest pneumonia.  Original Report Authenticated By: Waynard Reeds, M.D.   Dg Abd 2 Views  10/18/2011  *RADIOLOGY REPORT*  Clinical Data: Generalized abdominal pain, constipation  ABDOMEN - 2 VIEW  Comparison: 08/21/2011  Findings: Nonobstructive bowel gas pattern.  Moderate stool in the rectum.  No evidence of free air under the diaphragm on the upright view.  Visualized osseous structures are within normal limits.  IMPRESSION: No evidence of small bowel obstruction or free air.  Moderate stool in the rectum.  Original Report Authenticated By: Charline Bills, M.D.    Medications: I have reviewed the patient's current medications.   Patient Active Hospital Problem List: DKA, type 1 (08/21/2011) At this point blood sugars have been better controlled.  Patient is off of the glucostabilizer.  Suspect that patient had large fluid loss secondary to this and may currently still need IVF rehydration.  Will continue to monitor blood sugars q 4hours and q hc/hs  Tachycardia:  Suspect that patient may still require IVF rehydration secondary to increased diuresis from # 1.  Will administer a 500 cc fluid bolus and afterwards administer normal saline IVF at maintenance rate.  Abdominal pain (08/21/2011) Most likely secondary to constipation given abdominal x ray and improvement after BM.   Hypothyroidism (08/21/2011) Last TSH value 2.096 which is WNL.  Will continue current regimen.  Volume depletion (10/18/2011) Most likely from # 1 and patient  continues to have positive intake when looking at I/O's.  Will go ahead and provide fluid bolus at this time and normal saline as indicated above.  Last recorded blood pressure was 95/49 while patient was sleeping.  I administered a 500cc fluid bolus and had the nurse recheck her blood pressure which was reported as 115/65 @ 9:50 AM.   Bacteremia (10/19/2011)  Patient's Lactic Acid was WNL, WBC count is within normal limits.  Patient is non toxic appearing.  She does have tachycardia and her blood pressure has been low  normal but suspect that this is secondary to volume depletion as opposed to sepsis.    Will monitor closely and continue with Zosyn at this juncture.  Patient is currently on 3.375 g IV every 8 hours which is lower than the recommended dose in uptodate.  But after discussion with pharmacist based on patient's creatinine clearance the current dose should provide adequate coverage.  Will also add vancomycin today until sensitivities come back and then will plan on narrowing antibiotic coverage.     LOS: 2 days   Penny Pia M.D.  [facility] 10/19/2011, 9:22 AM

## 2011-10-20 DIAGNOSIS — E782 Mixed hyperlipidemia: Secondary | ICD-10-CM

## 2011-10-20 DIAGNOSIS — E860 Dehydration: Secondary | ICD-10-CM

## 2011-10-20 DIAGNOSIS — E1010 Type 1 diabetes mellitus with ketoacidosis without coma: Secondary | ICD-10-CM

## 2011-10-20 DIAGNOSIS — K5900 Constipation, unspecified: Secondary | ICD-10-CM | POA: Diagnosis present

## 2011-10-20 DIAGNOSIS — N179 Acute kidney failure, unspecified: Secondary | ICD-10-CM

## 2011-10-20 LAB — GLUCOSE, CAPILLARY
Glucose-Capillary: 154 mg/dL — ABNORMAL HIGH (ref 70–99)
Glucose-Capillary: 154 mg/dL — ABNORMAL HIGH (ref 70–99)
Glucose-Capillary: 157 mg/dL — ABNORMAL HIGH (ref 70–99)
Glucose-Capillary: 166 mg/dL — ABNORMAL HIGH (ref 70–99)
Glucose-Capillary: 172 mg/dL — ABNORMAL HIGH (ref 70–99)
Glucose-Capillary: 200 mg/dL — ABNORMAL HIGH (ref 70–99)
Glucose-Capillary: 253 mg/dL — ABNORMAL HIGH (ref 70–99)

## 2011-10-20 LAB — BASIC METABOLIC PANEL
BUN: 33 mg/dL — ABNORMAL HIGH (ref 6–23)
BUN: 31 mg/dL — ABNORMAL HIGH (ref 6–23)
CO2: 11 mEq/L — ABNORMAL LOW (ref 19–32)
CO2: 13 mEq/L — ABNORMAL LOW (ref 19–32)
Calcium: 7.2 mg/dL — ABNORMAL LOW (ref 8.4–10.5)
Chloride: 104 mEq/L (ref 96–112)
Chloride: 106 mEq/L (ref 96–112)
Creatinine, Ser: 2.78 mg/dL — ABNORMAL HIGH (ref 0.50–1.10)
Creatinine, Ser: 2.72 mg/dL — ABNORMAL HIGH (ref 0.50–1.10)
Creatinine, Ser: 2.71 mg/dL — ABNORMAL HIGH (ref 0.50–1.10)
GFR calc Af Amer: 27 mL/min — ABNORMAL LOW (ref >90)
GFR calc Af Amer: 27 mL/min — ABNORMAL LOW (ref >90)
GFR calc non Af Amer: 23 mL/min — ABNORMAL LOW (ref >90)
GFR calc non Af Amer: 23 mL/min — ABNORMAL LOW (ref >90)
GFR calc non Af Amer: 23 mL/min — ABNORMAL LOW (ref >90)
Glucose, Bld: 336 mg/dL — ABNORMAL HIGH (ref 70–99)
Glucose, Bld: 155 mg/dL — ABNORMAL HIGH (ref 70–99)
Potassium: 3.4 mEq/L — ABNORMAL LOW (ref 3.5–5.1)
Potassium: 3.1 mEq/L — ABNORMAL LOW (ref 3.5–5.1)
Sodium: 130 mEq/L — ABNORMAL LOW (ref 135–145)

## 2011-10-20 LAB — CULTURE, BLOOD (ROUTINE X 2)
Culture  Setup Time: 201305020454
Culture  Setup Time: 201305020454

## 2011-10-20 LAB — CBC
HCT: 29.2 % — ABNORMAL LOW (ref 36.0–46.0)
Hemoglobin: 10.2 g/dL — ABNORMAL LOW (ref 12.0–15.0)
MCHC: 34.9 g/dL (ref 30.0–36.0)
RDW: 13.6 % (ref 11.5–15.5)
WBC: 7 10*3/uL (ref 4.0–10.5)

## 2011-10-20 MED ORDER — DEXTROSE-NACL 5-0.45 % IV SOLN: INTRAVENOUS | Status: DC

## 2011-10-20 MED ORDER — KCL IN DEXTROSE-NACL 20-5-0.45 MEQ/L-%-% IV SOLN
INTRAVENOUS | Status: DC
  Administered 2011-10-20 – 2011-10-21 (×2): via INTRAVENOUS
  Filled 2011-10-20 (×3): qty 1000

## 2011-10-20 MED ORDER — SODIUM CHLORIDE 0.9 % IV SOLN
INTRAVENOUS | Status: DC
  Administered 2011-10-20: 1.2 [IU]/h via INTRAVENOUS
  Administered 2011-10-21: 2.8 [IU]/h via INTRAVENOUS
  Filled 2011-10-20 (×2): qty 1

## 2011-10-20 MED ORDER — POTASSIUM CHLORIDE CRYS ER 20 MEQ PO TBCR
40.0000 meq | EXTENDED_RELEASE_TABLET | Freq: Once | ORAL | Status: AC
  Administered 2011-10-20: 40 meq via ORAL
  Filled 2011-10-20: qty 2
  Filled 2011-10-20: qty 1

## 2011-10-20 MED ORDER — SODIUM CHLORIDE 0.9 % IV SOLN
INTRAVENOUS | Status: DC
  Administered 2011-10-20 – 2011-10-21 (×3): via INTRAVENOUS

## 2011-10-20 MED ORDER — INSULIN REGULAR BOLUS VIA INFUSION
0.0000 [IU] | Freq: Three times a day (TID) | INTRAVENOUS | Status: DC
  Filled 2011-10-20: qty 10

## 2011-10-20 MED ORDER — DEXTROSE 5 % IV SOLN
1.0000 g | Freq: Every day | INTRAVENOUS | Status: DC
  Administered 2011-10-20 – 2011-10-22 (×3): 1 g via INTRAVENOUS
  Filled 2011-10-20 (×4): qty 10

## 2011-10-20 MED ORDER — PANTOPRAZOLE SODIUM 40 MG PO TBEC
40.0000 mg | DELAYED_RELEASE_TABLET | Freq: Every day | ORAL | Status: DC
  Administered 2011-10-20 – 2011-10-23 (×4): 40 mg via ORAL
  Filled 2011-10-20 (×5): qty 1

## 2011-10-20 NOTE — Progress Notes (Signed)
ANTIBIOTIC CONSULT NOTE - INITIAL  Pharmacy Consult for Ceftriaxone Indication: E.coli bacteremia & bacteruria  No Known Allergies  Patient Measurements: Height: 5\' 2"  (157.5 cm) Weight: 127 lb 6.8 oz (57.8 kg) IBW/kg (Calculated) : 50.1   Labs:  Basename 10/20/11 1636 10/20/11 1230 10/20/11 0820 10/20/11 0402 10/19/11 0847 10/18/11 0620  WBC -- -- -- 7.0 6.8 6.3  HGB -- -- -- 10.2* 10.2* 10.6*  PLT -- -- -- 97* 99* 120*  LABCREA -- -- -- -- -- --  CREATININE 2.76* 2.72* 2.78* -- -- --   Estimated Creatinine Clearance: 24.9 ml/min (by C-G formula based on Cr of 2.76).   Medications:  Anti-infectives     Start     Dose/Rate Route Frequency Ordered Stop   10/19/11 1400   vancomycin (VANCOCIN) 750 mg in sodium chloride 0.9 % 150 mL IVPB  Status:  Discontinued        750 mg 150 mL/hr over 60 Minutes Intravenous Every 24 hours 10/19/11 1123 10/20/11 1759   10/18/11 1700   piperacillin-tazobactam (ZOSYN) IVPB 3.375 g  Status:  Discontinued        3.375 g 12.5 mL/hr over 240 Minutes Intravenous Every 8 hours 10/18/11 1605 10/20/11 1759   10/18/11 0415   cefTRIAXone (ROCEPHIN) 1 g in dextrose 5 % 50 mL IVPB  Status:  Discontinued        1 g 100 mL/hr over 30 Minutes Intravenous Every 24 hours 10/18/11 0400 10/18/11 1554          Assessment:  26 yo female admitted with malaise, N/V.  E.coli growing in blood and urine cultures. Pan-sensitive except Ampicillin.  Pt completed 2 days of Zosyn, 1 day Vancomycin, now starting ceftriaxone.  Goal of Therapy:  Appropriate Ceftriaxone dosing  Plan:   Ceftriaxone 1g IV q24h  F/u labs and cultures as available.   Lynann Beaver PharmD, BCPS Pager [phone] 10/20/2011 6:10 PM

## 2011-10-20 NOTE — Progress Notes (Signed)
Subjective: Patient at this juncture states that she feels better.  Has been having less abdominal discomfort after BM's.  Denies any dysuria.  Nursing reports that patient's appetite has been decreased.    Objective: Filed Vitals:   10/20/11 0500 10/20/11 0600 10/20/11 0700 10/20/11 0800  BP:    104/60  Pulse: 125 123 119   Temp:    99.4 F (37.4 C)  TempSrc:    Oral  Resp: 33 31 19   Height:      Weight:      SpO2: 93% 96% 100%    Weight change:   Intake/Output Summary (Last 24 hours) at 10/20/11 1231 Last data filed at 10/20/11 0945  Gross per 24 hour  Intake   3038 ml  Output    400 ml  Net   2638 ml    General: Alert, awake, oriented x3, in no acute distress.  HEENT: atraumatic, normocephalic, neck is supple, No bruits, no goiter.  Heart: Regular rate and rhythm, without murmurs, rubs, gallops.  Lungs: CTA BL, bilateral air movement.  Abdomen: Soft, nontender, nondistended, positive bowel sounds. No cva tenderness  Neuro: Grossly intact, nonfocal.   Lab Results:  [facility] 10/20/11 0820 10/18/11 0915  NA 130* 134*  K 3.8 4.2  CL 101 104  CO2 11* 19  GLUCOSE 336* 101*  BUN 35* 43*  CREATININE 2.78* 2.96*  CALCIUM 7.2* 7.1*  MG -- --  PHOS -- --    Basename 10/17/11 2345  AST 63*  ALT 42*  ALKPHOS 127*  BILITOT 0.7  PROT 7.3  ALBUMIN 2.8*    Basename 10/17/11 2345  LIPASE 9*  AMYLASE --    Basename 10/20/11 0402 10/19/11 0847 10/17/11 2345  WBC 7.0 6.8 --  NEUTROABS -- -- 4.0  HGB 10.2* 10.2* --  HCT 29.2* 30.1* --  MCV 88.5 89.1 --  PLT 97* 99* --   No results found for this basename: CKTOTAL:3,CKMB:3,CKMBINDEX:3,TROPONINI:3 in the last 72 hours No components found with this basename: POCBNP:3 No results found for this basename: DDIMER:2 in the last 72 hours  Basename 10/17/11 2345  HGBA1C 11.9*   No results found for this basename: CHOL:2,HDL:2,LDLCALC:2,TRIG:2,CHOLHDL:2,LDLDIRECT:2 in the last 72 hours  Basename 10/18/11 0620  TSH  2.096  T4TOTAL --  T3FREE --  THYROIDAB --   No results found for this basename: VITAMINB12:2,FOLATE:2,FERRITIN:2,TIBC:2,IRON:2,RETICCTPCT:2 in the last 72 hours  Micro Results: Recent Results (from the past 240 hour(s))  CULTURE, BLOOD (ROUTINE X 2)     Status: Normal   Collection Time   10/18/11 12:29 AM      Component Value Range Status Comment   Specimen Description BLOOD LEFT ANTECUBITAL   Final    Special Requests BOTTLES DRAWN AEROBIC AND ANAEROBIC 5CC   Final    Culture  Setup Time [id number]   Final    Culture     Final    Value: ESCHERICHIA COLI     Note: SUSCEPTIBILITIES PERFORMED ON PREVIOUS CULTURE WITHIN THE LAST 5 DAYS.     Note: Gram Stain Report Called to,Read Back By and Verified With: PAM WEST @ 1825 ON 10/18/11 BY GOLLD   Report Status 10/20/2011 FINAL   Final   CULTURE, BLOOD (ROUTINE X 2)     Status: Normal   Collection Time   10/18/11 12:34 AM      Component Value Range Status Comment   Specimen Description BLOOD LEFT HAND   Final    Special Requests BOTTLES DRAWN AEROBIC AND ANAEROBIC  [facility]   Final    Culture  Setup Time [id number]   Final    Culture     Final    Value: ESCHERICHIA COLI     Note: Gram Stain Report Called to,Read Back By and Verified With: PAM WEST 11/06/11 15:00 BY GARRS   Report Status 10/20/2011 FINAL   Final    Organism ID, Bacteria ESCHERICHIA COLI   Final   URINE CULTURE     Status: Normal   Collection Time   11-06-11  1:00 AM      Component Value Range Status Comment   Specimen Description URINE, CLEAN CATCH   Final    Special Requests NONE   Final    Culture  Setup Time [id number]   Final    Colony Count >=100,000 COLONIES/ML   Final    Culture ESCHERICHIA COLI   Final    Report Status 10/19/2011 FINAL   Final    Organism ID, Bacteria ESCHERICHIA COLI   Final   MRSA PCR SCREENING     Status: Normal   Collection Time   Nov 06, 2011  6:05 AM      Component Value Range Status Comment   MRSA by PCR NEGATIVE  NEGATIVE  Final      Studies/Results: Dg Abd 2 Views  Nov 06, 2011  *RADIOLOGY REPORT*  Clinical Data: Generalized abdominal pain, constipation  ABDOMEN - 2 VIEW  Comparison: 08/21/2011  Findings: Nonobstructive bowel gas pattern.  Moderate stool in the rectum.  No evidence of free air under the diaphragm on the upright view.  Visualized osseous structures are within normal limits.  IMPRESSION: No evidence of small bowel obstruction or free air.  Moderate stool in the rectum.  Original Report Authenticated By: Charline Bills, M.D.    Medications: I have reviewed the patient's current medications.   Patient Active Hospital Problem List: DKA, type 1 (08/21/2011)  Patient unfortunately is acidotic again.  Thus I will switch to insulin drip (glucostabilizer). Will plan on switching to home regimen but with increased lantus at 15 Units qhs and Insulin sliding scale as well as night coverage once patient meets criteria for coming off of the drip.  Constipation:  Will continue current regimen as patient mentions that she has had BM's recently and has been feeling better with less abdominal discomfort.  Hypothyroidism (08/21/2011) Continue home regimen.  Currently stable.  Volume depletion (2011-11-06) At this point will plan on continuing IVF's per protocol.  Bacteremia (10/19/2011) At this point patient is on IV antibiotics and currently is afebrile and wbc is WNL's.     LOS: 3 days   Penny Pia M.D.  [facility] 10/20/2011, 12:31 PM

## 2011-10-21 ENCOUNTER — Inpatient Hospital Stay (HOSPITAL_COMMUNITY): Payer: Medicare Other

## 2011-10-21 DIAGNOSIS — E1010 Type 1 diabetes mellitus with ketoacidosis without coma: Secondary | ICD-10-CM

## 2011-10-21 DIAGNOSIS — R7881 Bacteremia: Secondary | ICD-10-CM

## 2011-10-21 DIAGNOSIS — E872 Acidosis: Secondary | ICD-10-CM

## 2011-10-21 DIAGNOSIS — N179 Acute kidney failure, unspecified: Secondary | ICD-10-CM

## 2011-10-21 LAB — BLOOD GAS, ARTERIAL
Bicarbonate: 10.5 mEq/L — ABNORMAL LOW (ref 20.0–24.0)
Drawn by: 331471
O2 Saturation: 94.7 %
pH, Arterial: 7.339 — ABNORMAL LOW (ref 7.350–7.400)
pO2, Arterial: 96.3 mmHg (ref 80.0–100.0)

## 2011-10-21 LAB — GLUCOSE, CAPILLARY
Glucose-Capillary: 123 mg/dL — ABNORMAL HIGH (ref 70–99)
Glucose-Capillary: 133 mg/dL — ABNORMAL HIGH (ref 70–99)
Glucose-Capillary: 152 mg/dL — ABNORMAL HIGH (ref 70–99)
Glucose-Capillary: 188 mg/dL — ABNORMAL HIGH (ref 70–99)
Glucose-Capillary: 191 mg/dL — ABNORMAL HIGH (ref 70–99)
Glucose-Capillary: 200 mg/dL — ABNORMAL HIGH (ref 70–99)
Glucose-Capillary: 97 mg/dL (ref 70–99)

## 2011-10-21 LAB — URINALYSIS, ROUTINE W REFLEX MICROSCOPIC
Bilirubin Urine: NEGATIVE
Glucose, UA: 100 mg/dL — AB
Specific Gravity, Urine: 1.015 (ref 1.005–1.030)
Urobilinogen, UA: 0.2 mg/dL (ref 0.0–1.0)
pH: 6 (ref 5.0–8.0)

## 2011-10-21 LAB — BASIC METABOLIC PANEL
BUN: 29 mg/dL — ABNORMAL HIGH (ref 6–23)
BUN: 30 mg/dL — ABNORMAL HIGH (ref 6–23)
CO2: 8 mEq/L — CL (ref 19–32)
CO2: 15 mEq/L — ABNORMAL LOW (ref 19–32)
Calcium: 7.4 mg/dL — ABNORMAL LOW (ref 8.4–10.5)
Calcium: 7.4 mg/dL — ABNORMAL LOW (ref 8.4–10.5)
Calcium: 7.2 mg/dL — ABNORMAL LOW (ref 8.4–10.5)
Chloride: 105 mEq/L (ref 96–112)
Creatinine, Ser: 2.69 mg/dL — ABNORMAL HIGH (ref 0.50–1.10)
GFR calc Af Amer: 27 mL/min — ABNORMAL LOW (ref >90)
GFR calc Af Amer: 28 mL/min — ABNORMAL LOW (ref >90)
GFR calc non Af Amer: 24 mL/min — ABNORMAL LOW (ref >90)
Glucose, Bld: 158 mg/dL — ABNORMAL HIGH (ref 70–99)
Glucose, Bld: 235 mg/dL — ABNORMAL HIGH (ref 70–99)
Glucose, Bld: 187 mg/dL — ABNORMAL HIGH (ref 70–99)
Potassium: 3.9 mEq/L (ref 3.5–5.1)
Potassium: 4 mEq/L (ref 3.5–5.1)
Sodium: 128 mEq/L — ABNORMAL LOW (ref 135–145)
Sodium: 128 mEq/L — ABNORMAL LOW (ref 135–145)
Sodium: 129 mEq/L — ABNORMAL LOW (ref 135–145)
Sodium: 133 mEq/L — ABNORMAL LOW (ref 135–145)

## 2011-10-21 LAB — PROTEIN / CREATININE RATIO, URINE: Protein Creatinine Ratio: 0.51 — ABNORMAL HIGH (ref 0.00–0.15)

## 2011-10-21 LAB — CBC
Hemoglobin: 9.4 g/dL — ABNORMAL LOW (ref 12.0–15.0)
MCH: 30.4 pg (ref 26.0–34.0)
MCHC: 34.3 g/dL (ref 30.0–36.0)
RDW: 13.9 % (ref 11.5–15.5)

## 2011-10-21 LAB — URINE MICROSCOPIC-ADD ON

## 2011-10-21 LAB — IRON AND TIBC
Iron: 26 ug/dL — ABNORMAL LOW (ref 42–135)
Saturation Ratios: 16 % — ABNORMAL LOW (ref 20–55)
TIBC: 159 ug/dL — ABNORMAL LOW (ref 250–470)
UIBC: 133 ug/dL (ref 125–400)

## 2011-10-21 LAB — LACTIC ACID, PLASMA: Lactic Acid, Venous: 1 mmol/L (ref 0.5–2.2)

## 2011-10-21 MED ORDER — DEXTROSE 5 % IV SOLN
INTRAVENOUS | Status: DC
  Filled 2011-10-21 (×2): qty 150

## 2011-10-21 MED ORDER — SODIUM BICARBONATE 8.4 % IV SOLN
INTRAVENOUS | Status: DC
  Filled 2011-10-21: qty 100

## 2011-10-21 MED ORDER — SODIUM CHLORIDE 0.9 % IJ SOLN
10.0000 mL | Freq: Two times a day (BID) | INTRAMUSCULAR | Status: DC
  Administered 2011-10-21 – 2011-10-23 (×4): 10 mL

## 2011-10-21 MED ORDER — POTASSIUM CHLORIDE CRYS ER 20 MEQ PO TBCR
40.0000 meq | EXTENDED_RELEASE_TABLET | Freq: Once | ORAL | Status: AC
  Administered 2011-10-21: 40 meq via ORAL
  Filled 2011-10-21: qty 2

## 2011-10-21 MED ORDER — SODIUM CHLORIDE 0.9 % IJ SOLN: 10.0000 mL | INTRAMUSCULAR | Status: DC | PRN

## 2011-10-21 MED ORDER — SODIUM BICARBONATE 8.4 % IV SOLN
INTRAVENOUS | Status: DC
  Administered 2011-10-21 (×2): via INTRAVENOUS
  Filled 2011-10-21 (×5): qty 150

## 2011-10-21 NOTE — Consult Note (Addendum)
Reason for Consult: Metabolic acidosis/Acute renal failure Referring Physician: Penny Pia MD- [facility] Primary care provider: Norberto Sorenson M.D. [facility]  [name] Ross is an 26 y.o. female.  HPI: [name] Ross is a 26 y.o. African American woman with history of type 1 diabetes (15 years duration, no history of retinopathy,reports transient proteinuria that has apparently resolved) and hypothyroidism. She presented with a 1 week history of progressive nausea, decreased oral intake and not taking her insulin because of inability to eat.She was found to be in DKA and started on IV fluids (saline) and IV insulin. Labs also indicated that she was in acute renal failure with a  Creatinine of 3.6 up from 1.2 back in April of 2013. Creatinine improved slightly to 2.7 following 4 days of intravenous fluids however metabolic acidosis persists. Lactic acid level is within normal limits and glycemic control seemingly is better however, serum ketones persist. No preceding salicylate use/alcohol use noted.   From a review of records on [facility] from March and April of 2013, it appears that her baseline renal function is normal (Creatinine 0.7-0.9) with episodes of elevation coinciding with hyperglycemia/osmotic diuresis. She does not have a chronic metabolic acidosis. The patient denies any prior history of hematuria, kidney stones or pedal edema. She does have a history of proteinuria which is apparently improved/resolved-unable to tell me how. Reports frequent urinary tract infections about 3-4 times a year-treated with oral antibiotics as an outpatient    Past Medical History  Diagnosis Date  . Diabetes mellitus   . Hypothyroidism   . Anemia     Past Surgical History  Procedure Date  . Induced abortion     History reviewed. No pertinent family history.  Social History:  reports that she has never smoked. She has never used smokeless tobacco. She reports that she does not drink  alcohol or use illicit drugs.  Originally from [place] moved to [place] 5 years ago to be closer to paternal grandparents-lives in [place] where she attends [employer] college and works at [employer].   Allergies: No Known Allergies  Medications:  Scheduled:   . cefTRIAXone (ROCEPHIN)  IV  1 g Intravenous QPC supper  . docusate sodium  100 mg Oral BID  . heparin  5,000 Units Subcutaneous Q8H  . insulin regular  0-10 Units Intravenous TID WC  . levothyroxine  75 mcg Oral Daily  . pantoprazole  40 mg Oral Q1200  . polyethylene glycol  17 g Oral Daily  . potassium chloride  40 mEq Oral Once  . potassium chloride  40 mEq Oral Once  . sodium chloride  10-40 mL Intracatheter Q12H  . sodium chloride  3 mL Intravenous Q12H  . DISCONTD: piperacillin-tazobactam (ZOSYN)  IV  3.375 g Intravenous Q8H  . DISCONTD: vancomycin  750 mg Intravenous Q24H    Results for orders placed during the hospital encounter of 10/17/11 (from the past 48 hour(s))  GLUCOSE, CAPILLARY     Status: Abnormal   Collection Time   10/19/11 12:16 PM      Component Value Range Comment   Glucose-Capillary 162 (*) 70 - 99 (mg/dL)    Comment 1 Documented in Chart      Comment 2 Notify RN     GLUCOSE, CAPILLARY     Status: Abnormal   Collection Time   10/19/11  3:18 PM      Component Value Range Comment   Glucose-Capillary 158 (*) 70 - 99 (mg/dL)  Comment 1 Documented in Chart      Comment 2 Notify RN     GLUCOSE, CAPILLARY     Status: Abnormal   Collection Time   10/19/11  5:55 PM      Component Value Range Comment   Glucose-Capillary 200 (*) 70 - 99 (mg/dL)   GLUCOSE, CAPILLARY     Status: Abnormal   Collection Time   10/19/11  7:40 PM      Component Value Range Comment   Glucose-Capillary 249 (*) 70 - 99 (mg/dL)    Comment 1 Documented in Chart      Comment 2 Notify RN     GLUCOSE, CAPILLARY     Status: Abnormal   Collection Time   10/19/11 11:45 PM      Component Value Range  Comment   Glucose-Capillary 185 (*) 70 - 99 (mg/dL)   CBC     Status: Abnormal   Collection Time   10/20/11  4:02 AM      Component Value Range Comment   WBC 7.0  4.0 - 10.5 (K/uL)    RBC 3.30 (*) 3.87 - 5.11 (MIL/uL)    Hemoglobin 10.2 (*) 12.0 - 15.0 (g/dL)    HCT 16.1 (*) 09.6 - 46.0 (%)    MCV 88.5  78.0 - 100.0 (fL)    MCH 30.9  26.0 - 34.0 (pg)    MCHC 34.9  30.0 - 36.0 (g/dL)    RDW 04.5  40.9 - 81.1 (%)    Platelets 97 (*) 150 - 400 (K/uL) CONSISTENT WITH PREVIOUS RESULT  BASIC METABOLIC PANEL     Status: Abnormal   Collection Time   10/20/11  8:20 AM      Component Value Range Comment   Sodium 130 (*) 135 - 145 (mEq/L)    Potassium 3.8  3.5 - 5.1 (mEq/L)    Chloride 101  96 - 112 (mEq/L)    CO2 11 (*) 19 - 32 (mEq/L)    Glucose, Bld 336 (*) 70 - 99 (mg/dL)    BUN 35 (*) 6 - 23 (mg/dL)    Creatinine, Ser 9.14 (*) 0.50 - 1.10 (mg/dL)    Calcium 7.2 (*) 8.4 - 10.5 (mg/dL)    GFR calc non Af Amer 23 (*) >90 (mL/min)    GFR calc Af Amer 26 (*) >90 (mL/min)   GLUCOSE, CAPILLARY     Status: Abnormal   Collection Time   10/20/11  8:20 AM      Component Value Range Comment   Glucose-Capillary 340 (*) 70 - 99 (mg/dL)    Comment 1 Documented in Chart      Comment 2 Notify RN     GLUCOSE, CAPILLARY     Status: Abnormal   Collection Time   10/20/11 11:09 AM      Component Value Range Comment   Glucose-Capillary 184 (*) 70 - 99 (mg/dL)   GLUCOSE, CAPILLARY     Status: Abnormal   Collection Time   10/20/11 11:58 AM      Component Value Range Comment   Glucose-Capillary 166 (*) 70 - 99 (mg/dL)    Comment 1 Documented in Chart      Comment 2 Notify RN      Comment 3 Glucose Stabilizer     BASIC METABOLIC PANEL     Status: Abnormal   Collection Time   10/20/11 12:30 PM      Component Value Range Comment   Sodium 130 (*) 135 - 145 (mEq/L)  Potassium 3.6  3.5 - 5.1 (mEq/L)    Chloride 104  96 - 112 (mEq/L)    CO2 13 (*) 19 - 32 (mEq/L)    Glucose, Bld 155 (*) 70 - 99 (mg/dL)     BUN 33 (*) 6 - 23 (mg/dL)    Creatinine, Ser 1.61 (*) 0.50 - 1.10 (mg/dL)    Calcium 7.3 (*) 8.4 - 10.5 (mg/dL)    GFR calc non Af Amer 23 (*) >90 (mL/min)    GFR calc Af Amer 27 (*) >90 (mL/min)   GLUCOSE, CAPILLARY     Status: Abnormal   Collection Time   10/20/11  1:05 PM      Component Value Range Comment   Glucose-Capillary 154 (*) 70 - 99 (mg/dL)   GLUCOSE, CAPILLARY     Status: Abnormal   Collection Time   10/20/11  2:17 PM      Component Value Range Comment   Glucose-Capillary 154 (*) 70 - 99 (mg/dL)   GLUCOSE, CAPILLARY     Status: Abnormal   Collection Time   10/20/11  3:21 PM      Component Value Range Comment   Glucose-Capillary 155 (*) 70 - 99 (mg/dL)    Comment 1 Documented in Chart      Comment 2 Notify RN      Comment 3 Glucose Stabilizer     GLUCOSE, CAPILLARY     Status: Abnormal   Collection Time   10/20/11  4:24 PM      Component Value Range Comment   Glucose-Capillary 154 (*) 70 - 99 (mg/dL)    Comment 1 Documented in Chart      Comment 2 Notify RN      Comment 3 Glucose Stabilizer     BASIC METABOLIC PANEL     Status: Abnormal   Collection Time   10/20/11  4:36 PM      Component Value Range Comment   Sodium 131 (*) 135 - 145 (mEq/L)    Potassium 3.4 (*) 3.5 - 5.1 (mEq/L)    Chloride 106  96 - 112 (mEq/L)    CO2 13 (*) 19 - 32 (mEq/L)    Glucose, Bld 149 (*) 70 - 99 (mg/dL)    BUN 33 (*) 6 - 23 (mg/dL)    Creatinine, Ser 0.96 (*) 0.50 - 1.10 (mg/dL)    Calcium 7.1 (*) 8.4 - 10.5 (mg/dL)    GFR calc non Af Amer 23 (*) >90 (mL/min)    GFR calc Af Amer 27 (*) >90 (mL/min)   GLUCOSE, CAPILLARY     Status: Abnormal   Collection Time   10/20/11  5:24 PM      Component Value Range Comment   Glucose-Capillary 157 (*) 70 - 99 (mg/dL)    Comment 1 Documented in Chart      Comment 2 Notify RN      Comment 3 Glucose Stabilizer     GLUCOSE, CAPILLARY     Status: Abnormal   Collection Time   10/20/11  6:30 PM      Component Value Range Comment   Glucose-Capillary 152  (*) 70 - 99 (mg/dL)   GLUCOSE, CAPILLARY     Status: Abnormal   Collection Time   10/20/11  7:56 PM      Component Value Range Comment   Glucose-Capillary 157 (*) 70 - 99 (mg/dL)   BASIC METABOLIC PANEL     Status: Abnormal   Collection Time   10/20/11  8:05 PM  Component Value Range Comment   Sodium 130 (*) 135 - 145 (mEq/L)    Potassium 3.1 (*) 3.5 - 5.1 (mEq/L)    Chloride 103  96 - 112 (mEq/L)    CO2 15 (*) 19 - 32 (mEq/L)    Glucose, Bld 167 (*) 70 - 99 (mg/dL)    BUN 31 (*) 6 - 23 (mg/dL)    Creatinine, Ser 1.61 (*) 0.50 - 1.10 (mg/dL)    Calcium 7.2 (*) 8.4 - 10.5 (mg/dL)    GFR calc non Af Amer 23 (*) >90 (mL/min)    GFR calc Af Amer 27 (*) >90 (mL/min)   GLUCOSE, CAPILLARY     Status: Abnormal   Collection Time   10/20/11  8:54 PM      Component Value Range Comment   Glucose-Capillary 161 (*) 70 - 99 (mg/dL)   GLUCOSE, CAPILLARY     Status: Abnormal   Collection Time   10/20/11 10:00 PM      Component Value Range Comment   Glucose-Capillary 172 (*) 70 - 99 (mg/dL)   GLUCOSE, CAPILLARY     Status: Abnormal   Collection Time   10/20/11 11:01 PM      Component Value Range Comment   Glucose-Capillary 200 (*) 70 - 99 (mg/dL)   GLUCOSE, CAPILLARY     Status: Abnormal   Collection Time   10/20/11 11:59 PM      Component Value Range Comment   Glucose-Capillary 253 (*) 70 - 99 (mg/dL)   BASIC METABOLIC PANEL     Status: Abnormal   Collection Time   10/21/11 12:30 AM      Component Value Range Comment   Sodium 128 (*) 135 - 145 (mEq/L)    Potassium 3.8  3.5 - 5.1 (mEq/L)    Chloride 103  96 - 112 (mEq/L)    CO2 13 (*) 19 - 32 (mEq/L)    Glucose, Bld 254 (*) 70 - 99 (mg/dL)    BUN 30 (*) 6 - 23 (mg/dL)    Creatinine, Ser 0.96 (*) 0.50 - 1.10 (mg/dL)    Calcium 7.4 (*) 8.4 - 10.5 (mg/dL)    GFR calc non Af Amer 24 (*) >90 (mL/min)    GFR calc Af Amer 27 (*) >90 (mL/min)   GLUCOSE, CAPILLARY     Status: Abnormal   Collection Time   10/21/11  1:04 AM      Component Value Range  Comment   Glucose-Capillary 115 (*) 70 - 99 (mg/dL)   GLUCOSE, CAPILLARY     Status: Abnormal   Collection Time   10/21/11  2:02 AM      Component Value Range Comment   Glucose-Capillary 197 (*) 70 - 99 (mg/dL)   GLUCOSE, CAPILLARY     Status: Abnormal   Collection Time   10/21/11  3:08 AM      Component Value Range Comment   Glucose-Capillary 191 (*) 70 - 99 (mg/dL)   CBC     Status: Abnormal   Collection Time   10/21/11  3:50 AM      Component Value Range Comment   WBC 9.3  4.0 - 10.5 (K/uL)    RBC 3.09 (*) 3.87 - 5.11 (MIL/uL)    Hemoglobin 9.4 (*) 12.0 - 15.0 (g/dL)    HCT 04.5 (*) 40.9 - 46.0 (%)    MCV 88.7  78.0 - 100.0 (fL)    MCH 30.4  26.0 - 34.0 (pg)    MCHC 34.3  30.0 -  36.0 (g/dL)    RDW 16.1  09.6 - 04.5 (%)    Platelets 106 (*) 150 - 400 (K/uL) CONSISTENT WITH PREVIOUS RESULT  BASIC METABOLIC PANEL     Status: Abnormal   Collection Time   10/21/11  3:50 AM      Component Value Range Comment   Sodium 128 (*) 135 - 145 (mEq/L)    Potassium 3.2 (*) 3.5 - 5.1 (mEq/L)    Chloride 103  96 - 112 (mEq/L)    CO2 13 (*) 19 - 32 (mEq/L)    Glucose, Bld 158 (*) 70 - 99 (mg/dL)    BUN 29 (*) 6 - 23 (mg/dL)    Creatinine, Ser 4.09 (*) 0.50 - 1.10 (mg/dL)    Calcium 7.4 (*) 8.4 - 10.5 (mg/dL)    GFR calc non Af Amer 24 (*) >90 (mL/min)    GFR calc Af Amer 28 (*) >90 (mL/min)   GLUCOSE, CAPILLARY     Status: Abnormal   Collection Time   10/21/11  4:31 AM      Component Value Range Comment   Glucose-Capillary 119 (*) 70 - 99 (mg/dL)   GLUCOSE, CAPILLARY     Status: Abnormal   Collection Time   10/21/11  5:28 AM      Component Value Range Comment   Glucose-Capillary 123 (*) 70 - 99 (mg/dL)   GLUCOSE, CAPILLARY     Status: Abnormal   Collection Time   10/21/11  7:01 AM      Component Value Range Comment   Glucose-Capillary 216 (*) 70 - 99 (mg/dL)   GLUCOSE, CAPILLARY     Status: Abnormal   Collection Time   10/21/11  8:04 AM      Component Value Range Comment   Glucose-Capillary  234 (*) 70 - 99 (mg/dL)    Comment 1 Documented in Chart      Comment 2 Notify RN      Comment 3 Glucose Stabilizer     BLOOD GAS, ARTERIAL     Status: Abnormal   Collection Time   10/21/11  8:30 AM      Component Value Range Comment   O2 Content ROOM AIR      pH, Arterial 7.339 (*) 7.350 - 7.400     pCO2 arterial 20.0 (*) 35.0 - 45.0 (mmHg)    pO2, Arterial 96.3  80.0 - 100.0 (mmHg)    Bicarbonate 10.5 (*) 20.0 - 24.0 (mEq/L)    TCO2 9.9  0 - 100 (mmol/L)    Acid-base deficit 13.7 (*) 0.0 - 2.0 (mmol/L)    O2 Saturation 94.7      Patient temperature 98.6      Collection site RIGHT RADIAL      Drawn by [id number]      Sample type ARTERIAL DRAW      Allens test (pass/fail) PASS  PASS    KETONES, QUALITATIVE     Status: Abnormal   Collection Time   10/21/11  9:15 AM      Component Value Range Comment   Acetone, Bld MODERATE (*) NEGATIVE    BASIC METABOLIC PANEL     Status: Abnormal   Collection Time   10/21/11  9:15 AM      Component Value Range Comment   Sodium 129 (*) 135 - 145 (mEq/L)    Potassium 3.9  3.5 - 5.1 (mEq/L)    Chloride 105  96 - 112 (mEq/L)    CO2 8 (*) 19 - 32 (mEq/L)  Glucose, Bld 235 (*) 70 - 99 (mg/dL)    BUN 30 (*) 6 - 23 (mg/dL)    Creatinine, Ser 1.61 (*) 0.50 - 1.10 (mg/dL)    Calcium 7.5 (*) 8.4 - 10.5 (mg/dL)    GFR calc non Af Amer 24 (*) >90 (mL/min)    GFR calc Af Amer 28 (*) >90 (mL/min)   LACTIC ACID, PLASMA     Status: Normal   Collection Time   10/21/11  9:15 AM      Component Value Range Comment   Lactic Acid, Venous 1.0  0.5 - 2.2 (mmol/L)   GLUCOSE, CAPILLARY     Status: Abnormal   Collection Time   10/21/11  9:18 AM      Component Value Range Comment   Glucose-Capillary 200 (*) 70 - 99 (mg/dL)   GLUCOSE, CAPILLARY     Status: Abnormal   Collection Time   10/21/11 10:21 AM      Component Value Range Comment   Glucose-Capillary 137 (*) 70 - 99 (mg/dL)   GLUCOSE, CAPILLARY     Status: Normal   Collection Time   10/21/11 11:01 AM      Component  Value Range Comment   Glucose-Capillary 97  70 - 99 (mg/dL)    Comment 1 Documented in Chart      Comment 2 Notify RN      Comment 3 Glucose Stabilizer       No results found.  Review of Systems  Constitutional: Positive for malaise/fatigue. Negative for fever, chills, weight loss and diaphoresis.  HENT: Negative for hearing loss, ear pain, nosebleeds, congestion, sore throat, tinnitus and ear discharge.   Eyes: Negative.   Respiratory: Negative.  Negative for stridor.   Cardiovascular: Negative.   Gastrointestinal: Positive for nausea and abdominal pain. Negative for heartburn, vomiting, diarrhea, constipation, blood in stool and melena.  Genitourinary: Negative.   Musculoskeletal: Negative.   Skin: Negative.   Neurological: Positive for weakness and headaches.  Endo/Heme/Allergies: Negative.   Psychiatric/Behavioral: Negative.   All other systems reviewed and are negative.   Blood pressure 111/62, pulse 104, temperature 98.6 F (37 C), temperature source Oral, resp. rate 30, height 5\' 2"  (1.575 m), weight 57.8 kg (127 lb 6.8 oz), last menstrual period 09/20/2011, SpO2 100.00%. Physical Exam  Nursing note and vitals reviewed. Constitutional: She is oriented to person, place, and time. She appears well-developed and well-nourished. No distress.  HENT:  Head: Normocephalic.  Nose: Nose normal.  Mouth/Throat: Oropharynx is clear and moist. No oropharyngeal exudate.  Eyes: Conjunctivae and EOM are normal. Pupils are equal, round, and reactive to light. Right eye exhibits no discharge. Left eye exhibits no discharge. No scleral icterus.  Neck: Normal range of motion. Neck supple. No JVD present. No tracheal deviation present. No thyromegaly present.  Cardiovascular: Normal rate and regular rhythm.  Exam reveals no gallop and no friction rub.   No murmur heard. Respiratory: Effort normal and breath sounds normal. No stridor. No respiratory distress. She has no wheezes. She has no  rales. She exhibits no tenderness.  GI: Soft. Bowel sounds are normal. She exhibits distension. She exhibits no mass. There is tenderness. There is no rebound and no guarding.       The abdomen is tympanitic on percussion, tenderness is elicited over both lower quadrants upon deep palpation. Appears to improve upon distraction.  Musculoskeletal: Normal range of motion. She exhibits no edema and no tenderness.  Lymphadenopathy:    She has no cervical adenopathy.  Neurological: She is alert and oriented to person, place, and time. No cranial nerve deficit. Coordination normal.  Skin: Skin is warm and dry. No rash noted. She is not diaphoretic. No erythema. No pallor.  Psychiatric: She has a normal mood and affect. Judgment normal.    Assessment/Plan: 1. Acute Renal failure: Suspect ATN (Likely ischemic) given minimal improvement in renal function from IVFs, UA not convincing of GN process but will check ANA and Urine P/C ratio. Check renal US. 2. Metabolic acidosis with anion gap: Suspect that this is bifactorial from acute renal failure and diabetic ketoacidosis. Lactic acid levels are negative and I will request for salicylate levels. Based on the delta-delta equation, there does not appear to be a significant non-anion gap component and respiratory compensation seems to be appropriate with a PCO2 of 20. In spite of this being an organic acidosis, we'll give her controlled volume of intravenous sodium bicarbonate and reassess her progress as diabetic ketoacidosis is treated and as she recovers from acute renal failure. Given the fact that she is in acute renal failure, it would be futile to do any urine electrolyte studies to understand a non-anion gap metabolic acidosis. With a total body water volume of 30 L and desired bicarbonate level of 18, we'll give her approximately 300 mEq of sodium bicarbonate and discontinue the same as I fear a rebound metabolic alkalosis as ketones are processed. 3.  Hyponatremia: Secondary to hyper glycemia and impaired water handling by an acutely injured kidney. Continue isotonic fluids and anticipate renal recovery will help correct her hyponatremia. 4. Diabetic ketoacidosis: On insulin drip per primary service. 5. Anemia: Young menstruating woman likely with associated anemia. Will check iron panel. Surprisingly- thrombocytopenic and with 3 gm Hgb drop (partly dilutional) over the past few weeks.  Yang Rack K. 10/21/2011, 11:50 AM

## 2011-10-21 NOTE — Progress Notes (Signed)
Subjective: No acute issues overnight.  Nurse reported that patient has lost one of her IV access.  She has been on the insulin gtt since last night.  Most recent BMP CO2 reported at 8 despite IVF's and insulin gtt.    Objective: Filed Vitals:   10/21/11 0000 10/21/11 0400 10/21/11 0800 10/21/11 1030  BP: 123/66 111/68  111/62  Pulse: 126 90 104   Temp: 102.3 F (39.1 C) 98.5 F (36.9 C) 98.6 F (37 C)   TempSrc: Oral Oral Oral   Resp: 23 25 30    Height:      Weight:      SpO2: 99% 100% 100%    Weight change:   Intake/Output Summary (Last 24 hours) at 10/21/11 1152 Last data filed at 10/21/11 1110  Gross per 24 hour  Intake 3567.02 ml  Output      0 ml  Net 3567.02 ml    General: Alert, awake, oriented x3, in no acute distress.  HEENT: Atraumatic normocephalic, EOMI, No bruits, no goiter.  Heart: Regular rate and rhythm, without murmurs, rubs, gallops.  Lungs: Clear to auscultation, no wheezes or rhales Abdomen: Soft, nontender, nondistended, positive bowel sounds.  Neuro: Grossly intact, nonfocal.   Lab Results:  Basename 10/21/11 0915 10/21/11 0350  NA 129* 128*  K 3.9 3.2*  CL 105 103  CO2 8* 13*  GLUCOSE 235* 158*  BUN 30* 29*  CREATININE 2.66* 2.68*  CALCIUM 7.5* 7.4*  MG -- --  PHOS -- --   No results found for this basename: AST:2,ALT:2,ALKPHOS:2,BILITOT:2,PROT:2,ALBUMIN:2 in the last 72 hours No results found for this basename: LIPASE:2,AMYLASE:2 in the last 72 hours  Basename 10/21/11 0350 10/20/11 0402  WBC 9.3 7.0  NEUTROABS -- --  HGB 9.4* 10.2*  HCT 27.4* 29.2*  MCV 88.7 88.5  PLT 106* 97*   No results found for this basename: CKTOTAL:3,CKMB:3,CKMBINDEX:3,TROPONINI:3 in the last 72 hours No components found with this basename: POCBNP:3 No results found for this basename: DDIMER:2 in the last 72 hours No results found for this basename: HGBA1C:2 in the last 72 hours No results found for this basename:  CHOL:2,HDL:2,LDLCALC:2,TRIG:2,CHOLHDL:2,LDLDIRECT:2 in the last 72 hours No results found for this basename: TSH,T4TOTAL,FREET3,T3FREE,THYROIDAB in the last 72 hours No results found for this basename: VITAMINB12:2,FOLATE:2,FERRITIN:2,TIBC:2,IRON:2,RETICCTPCT:2 in the last 72 hours  Micro Results: Recent Results (from the past 240 hour(s))  CULTURE, BLOOD (ROUTINE X 2)     Status: Normal   Collection Time   10/18/11 12:29 AM      Component Value Range Status Comment   Specimen Description BLOOD LEFT ANTECUBITAL   Final    Special Requests BOTTLES DRAWN AEROBIC AND ANAEROBIC 5CC   Final    Culture  Setup Time [id number]   Final    Culture     Final    Value: ESCHERICHIA COLI     Note: SUSCEPTIBILITIES PERFORMED ON PREVIOUS CULTURE WITHIN THE LAST 5 DAYS.     Note: Gram Stain Report Called to,Read Back By and Verified With: PAM WEST @ 1825 ON 10/18/11 BY GOLLD   Report Status 10/20/2011 FINAL   Final   CULTURE, BLOOD (ROUTINE X 2)     Status: Normal   Collection Time   10/18/11 12:34 AM      Component Value Range Status Comment   Specimen Description BLOOD LEFT HAND   Final    Special Requests BOTTLES DRAWN AEROBIC AND ANAEROBIC 4CC   Final    Culture  Setup Time [id number]  Final    Culture     Final    Value: ESCHERICHIA COLI     Note: Gram Stain Report Called to,Read Back By and Verified With: PAM WEST 10/18/11 15:00 BY GARRS   Report Status 10/20/2011 FINAL   Final    Organism ID, Bacteria ESCHERICHIA COLI   Final   URINE CULTURE     Status: Normal   Collection Time   10/18/11  1:00 AM      Component Value Range Status Comment   Specimen Description URINE, CLEAN CATCH   Final    Special Requests NONE   Final    Culture  Setup Time [id number]   Final    Colony Count >=100,000 COLONIES/ML   Final    Culture ESCHERICHIA COLI   Final    Report Status 10/19/2011 FINAL   Final    Organism ID, Bacteria ESCHERICHIA COLI   Final   MRSA PCR SCREENING     Status: Normal   Collection  Time   10/18/11  6:05 AM      Component Value Range Status Comment   MRSA by PCR NEGATIVE  NEGATIVE  Final     Studies/Results: No results found.  Medications: I have reviewed the patient's current medications.   Patient Active Hospital Problem List:  DKA, type 1 (08/21/2011)  At this point suspect that patient is currently acidotic mainly due to DKA given moderate ketones in blood.  Has been on insulin gtt since yesterday evening.  But CO2 continues to decrease despite IVF hydration and insulin gtt.   Suspect that there may be another process going on particularly with her creatinine at 2.66 and as such have consulted nephro. Lactic acid level is WNL.  Very much appreciate their help with this case.  Hypothyroidism (08/21/2011) Stable at this point will continue current regimen.  Volume depletion (10/18/2011) Patient is on IVF's and switched today to D5 bicarb infusion at 125 ml/hr.  Hypotension and tachycardia has resolved.   Bacteremia (10/19/2011) Currently on Rocephin given sensitivities appropriate treatment at this time.  Patient grew out E. Coli.  Constipation (10/20/2011)  Improved on current regimen.     LOS: 4 days   Penny Pia M.D.  [facility] 10/21/2011, 11:52 AM

## 2011-10-21 NOTE — Progress Notes (Signed)
CRITICAL VALUE ALERT  Critical value received:  CO2 8  Date of notification:  10/21/11  Time of notification:  0955  Critical value read back: yes  Nurse who received alert:  Vernell Leep  MD notified (1st page):  Dr. Cena Benton  Time of first page:  [phone]  MD notified (2nd page):  Time of second page:  Responding MD:  Dr. Cena Benton  Time MD responded: 1000

## 2011-10-22 DIAGNOSIS — E872 Acidosis, unspecified: Secondary | ICD-10-CM | POA: Diagnosis present

## 2011-10-22 DIAGNOSIS — R7881 Bacteremia: Secondary | ICD-10-CM

## 2011-10-22 DIAGNOSIS — E1010 Type 1 diabetes mellitus with ketoacidosis without coma: Secondary | ICD-10-CM

## 2011-10-22 DIAGNOSIS — N179 Acute kidney failure, unspecified: Secondary | ICD-10-CM

## 2011-10-22 DIAGNOSIS — N19 Unspecified kidney failure: Secondary | ICD-10-CM | POA: Diagnosis present

## 2011-10-22 LAB — GLUCOSE, CAPILLARY
Glucose-Capillary: 143 mg/dL — ABNORMAL HIGH (ref 70–99)
Glucose-Capillary: 147 mg/dL — ABNORMAL HIGH (ref 70–99)
Glucose-Capillary: 179 mg/dL — ABNORMAL HIGH (ref 70–99)
Glucose-Capillary: 238 mg/dL — ABNORMAL HIGH (ref 70–99)

## 2011-10-22 LAB — BASIC METABOLIC PANEL
BUN: 25 mg/dL — ABNORMAL HIGH (ref 6–23)
BUN: 24 mg/dL — ABNORMAL HIGH (ref 6–23)
BUN: 23 mg/dL (ref 6–23)
Calcium: 6.5 mg/dL — ABNORMAL LOW (ref 8.4–10.5)
Chloride: 101 mEq/L (ref 96–112)
Chloride: 102 mEq/L (ref 96–112)
Creatinine, Ser: 2.33 mg/dL — ABNORMAL HIGH (ref 0.50–1.10)
Creatinine, Ser: 2.56 mg/dL — ABNORMAL HIGH (ref 0.50–1.10)
Creatinine, Ser: 2.54 mg/dL — ABNORMAL HIGH (ref 0.50–1.10)
GFR calc Af Amer: 33 mL/min — ABNORMAL LOW (ref >90)
GFR calc Af Amer: 29 mL/min — ABNORMAL LOW (ref >90)
GFR calc Af Amer: 29 mL/min — ABNORMAL LOW (ref >90)
GFR calc Af Amer: 31 mL/min — ABNORMAL LOW (ref >90)
GFR calc non Af Amer: 28 mL/min — ABNORMAL LOW (ref >90)
GFR calc non Af Amer: 25 mL/min — ABNORMAL LOW (ref >90)
GFR calc non Af Amer: 26 mL/min — ABNORMAL LOW (ref >90)
Potassium: 4 mEq/L (ref 3.5–5.1)

## 2011-10-22 LAB — URINE MICROSCOPIC-ADD ON

## 2011-10-22 LAB — ANA: Anti Nuclear Antibody(ANA): NEGATIVE

## 2011-10-22 LAB — URINALYSIS, ROUTINE W REFLEX MICROSCOPIC
Glucose, UA: NEGATIVE mg/dL
Protein, ur: 30 mg/dL — AB
pH: 6 (ref 5.0–8.0)

## 2011-10-22 MED ORDER — SODIUM BICARBONATE 650 MG PO TABS
650.0000 mg | ORAL_TABLET | Freq: Three times a day (TID) | ORAL | Status: DC
  Administered 2011-10-22 – 2011-10-23 (×4): 650 mg via ORAL
  Filled 2011-10-22 (×5): qty 1

## 2011-10-22 MED ORDER — INSULIN ASPART 100 UNIT/ML ~~LOC~~ SOLN
0.0000 [IU] | SUBCUTANEOUS | Status: DC
  Administered 2011-10-22: 2 [IU] via SUBCUTANEOUS
  Administered 2011-10-22: 5 [IU] via SUBCUTANEOUS
  Administered 2011-10-22: 3 [IU] via SUBCUTANEOUS
  Administered 2011-10-22: 5 [IU] via SUBCUTANEOUS
  Administered 2011-10-23 (×2): 2 [IU] via SUBCUTANEOUS

## 2011-10-22 MED ORDER — INSULIN ASPART 100 UNIT/ML ~~LOC~~ SOLN: 0.0000 [IU] | Freq: Every day | SUBCUTANEOUS | Status: DC

## 2011-10-22 MED ORDER — SODIUM CHLORIDE 0.9 % IV SOLN: INTRAVENOUS | Status: DC

## 2011-10-22 MED ORDER — INSULIN ASPART 100 UNIT/ML ~~LOC~~ SOLN: 0.0000 [IU] | Freq: Three times a day (TID) | SUBCUTANEOUS | Status: DC

## 2011-10-22 MED ORDER — INSULIN GLARGINE 100 UNIT/ML ~~LOC~~ SOLN
10.0000 [IU] | SUBCUTANEOUS | Status: AC
  Administered 2011-10-22: 10 [IU] via SUBCUTANEOUS

## 2011-10-22 MED ORDER — POTASSIUM CHLORIDE CRYS ER 20 MEQ PO TBCR
40.0000 meq | EXTENDED_RELEASE_TABLET | Freq: Once | ORAL | Status: AC
  Administered 2011-10-22: 40 meq via ORAL
  Filled 2011-10-22: qty 2

## 2011-10-22 MED ORDER — SODIUM BICARBONATE 8.4 % IV SOLN
INTRAVENOUS | Status: DC
  Administered 2011-10-22 (×2): via INTRAVENOUS
  Filled 2011-10-22 (×2): qty 150

## 2011-10-22 MED ORDER — SODIUM CHLORIDE 0.9 % IV SOLN
INTRAVENOUS | Status: DC
  Administered 2011-10-23 (×2): via INTRAVENOUS

## 2011-10-22 MED ORDER — POTASSIUM CHLORIDE 10 MEQ/100ML IV SOLN
10.0000 meq | INTRAVENOUS | Status: AC
  Administered 2011-10-22 (×4): 10 meq via INTRAVENOUS
  Filled 2011-10-22: qty 400

## 2011-10-22 MED ORDER — INSULIN GLARGINE 100 UNIT/ML ~~LOC~~ SOLN
10.0000 [IU] | Freq: Every day | SUBCUTANEOUS | Status: DC
  Administered 2011-10-22: 10 [IU] via SUBCUTANEOUS

## 2011-10-22 NOTE — Progress Notes (Signed)
CSW switching to Tommi Emery.  This CSW signing off.  Vennie Homans, [place] 10/22/2011 8:39 AM [phone]

## 2011-10-22 NOTE — Progress Notes (Signed)
Subjective: Patient states that she feels better today. No acute issues overnight reported.  Patient was able to be transitioned off of the glucostabilizer and is currently on Lantus.   Objective: Filed Vitals:   10/22/11 0530 10/22/11 0711 10/22/11 1011 10/22/11 1419  BP: 120/66 119/78 113/77 124/78  Pulse: 109 105 101 107  Temp:  99.8 F (37.7 C) 98.8 F (37.1 C) 100.5 F (38.1 C)  TempSrc:  Oral Oral Oral  Resp: 21 20 18 18   Height:  5\' 2"  (1.575 m)    Weight:  65.2 kg (143 lb 11.8 oz)    SpO2: 96% 93% 96% 96%   Weight change:   Intake/Output Summary (Last 24 hours) at 10/22/11 1641 Last data filed at 10/22/11 0600  Gross per 24 hour  Intake 2218.8 ml  Output    400 ml  Net 1818.8 ml    General: Alert, awake, oriented x3, in no acute distress.  HEENT: No bruits, no goiter.  Heart: Regular rate and rhythm, without murmurs, rubs, gallops.  Lungs: Clear to auscultation, bilateral air movement.  Abdomen: Soft, nontender, nondistended, positive bowel sounds.  Neuro: Grossly intact, nonfocal.   Lab Results:  Basename 10/22/11 1400 10/22/11 0850  NA 133* 129*  K 4.0 4.2  CL 102 101  CO2 21 19  GLUCOSE 214* 215*  BUN 23 24*  CREATININE 2.45* 2.54*  CALCIUM 7.1* 7.1*  MG -- --  PHOS -- --   No results found for this basename: AST:2,ALT:2,ALKPHOS:2,BILITOT:2,PROT:2,ALBUMIN:2 in the last 72 hours No results found for this basename: LIPASE:2,AMYLASE:2 in the last 72 hours  Basename 10/21/11 0350 10/20/11 0402  WBC 9.3 7.0  NEUTROABS -- --  HGB 9.4* 10.2*  HCT 27.4* 29.2*  MCV 88.7 88.5  PLT 106* 97*   No results found for this basename: CKTOTAL:3,CKMB:3,CKMBINDEX:3,TROPONINI:3 in the last 72 hours No components found with this basename: POCBNP:3 No results found for this basename: DDIMER:2 in the last 72 hours No results found for this basename: HGBA1C:2 in the last 72 hours No results found for this basename: CHOL:2,HDL:2,LDLCALC:2,TRIG:2,CHOLHDL:2,LDLDIRECT:2  in the last 72 hours No results found for this basename: TSH,T4TOTAL,FREET3,T3FREE,THYROIDAB in the last 72 hours  Basename 10/21/11 1600  VITAMINB12 --  FOLATE --  FERRITIN 219  TIBC 159*  IRON 26*  RETICCTPCT --    Micro Results: Recent Results (from the past 240 hour(s))  CULTURE, BLOOD (ROUTINE X 2)     Status: Normal   Collection Time   10/18/11 12:29 AM      Component Value Range Status Comment   Specimen Description BLOOD LEFT ANTECUBITAL   Final    Special Requests BOTTLES DRAWN AEROBIC AND ANAEROBIC 5CC   Final    Culture  Setup Time [id number]   Final    Culture     Final    Value: ESCHERICHIA COLI     Note: SUSCEPTIBILITIES PERFORMED ON PREVIOUS CULTURE WITHIN THE LAST 5 DAYS.     Note: Gram Stain Report Called to,Read Back By and Verified With: PAM WEST @ 1825 ON 10/18/11 BY GOLLD   Report Status 10/20/2011 FINAL   Final   CULTURE, BLOOD (ROUTINE X 2)     Status: Normal   Collection Time   10/18/11 12:34 AM      Component Value Range Status Comment   Specimen Description BLOOD LEFT HAND   Final    Special Requests BOTTLES DRAWN AEROBIC AND ANAEROBIC 4CC   Final    Culture  Setup Time [id number]  Final    Culture     Final    Value: ESCHERICHIA COLI     Note: Gram Stain Report Called to,Read Back By and Verified With: PAM WEST 10/18/11 15:00 BY GARRS   Report Status 10/20/2011 FINAL   Final    Organism ID, Bacteria ESCHERICHIA COLI   Final   URINE CULTURE     Status: Normal   Collection Time   10/18/11  1:00 AM      Component Value Range Status Comment   Specimen Description URINE, CLEAN CATCH   Final    Special Requests NONE   Final    Culture  Setup Time [id number]   Final    Colony Count >=100,000 COLONIES/ML   Final    Culture ESCHERICHIA COLI   Final    Report Status 10/19/2011 FINAL   Final    Organism ID, Bacteria ESCHERICHIA COLI   Final   MRSA PCR SCREENING     Status: Normal   Collection Time   10/18/11  6:05 AM      Component Value Range Status  Comment   MRSA by PCR NEGATIVE  NEGATIVE  Final     Studies/Results: US Renal  10/21/2011  *RADIOLOGY REPORT*  Clinical Data: Acute renal failure.  RENAL/URINARY TRACT ULTRASOUND COMPLETE  Comparison:  None  Findings:  Right Kidney:  10.6 cm in length.  Normal renal cortical thickness but overall slight increased echogenicity which may suggest medical renal disease.  No hydronephrosis or renal calculi.  Left Kidney:  12.7 cm in length.  Normal renal cortical thickness but overall slight increased echogenicity.  No hydronephrosis.  The perinephric fluid collection is noted at the superior pole.  Bladder:  Normal.  Additional findings:  Fluid noted in the hepatorenal fossa on the right and there are bilateral pleural effusions.  IMPRESSION:  1.  Slight increased echogenicity of both kidneys suggesting medical renal disease. 2.  No hydronephrosis. 3.  Bilateral pleural effusions and abdominal fluid collections.  Original Report Authenticated By: P. Loralie Champagne, M.D.    Medications: I have reviewed the patient's current medications.   Patient Active Hospital Problem List: DKA, type 1 (08/21/2011) Pt is currently off of glucostabilizer and on Lantus and ISS.  Blood sugars have been relatively well controlled. Ranged [id number].  Bicarb 21 with supplemental bicarb tabs currently.  Patient is on normal saline at this juncture.  Metabolic Acidosis Appreciate Neuro input.  Likely multifactorial.  Suspect that kidneys may not be producing enough bicarb.  Patient did have moderate ketones in blood serum yesterday.  Thus her diabetes was still a factor in contributing to the acidosis.    Renal failure suspect acute on chronic:  At first thought it was related to hypovolemia.  But creatinine has remained elevated despite fluid rehydration.  Once again Renal on board and they have placed orders for lab test for tomorrow.  We will follow up with their recommendations and results.  Hypothyroidism  (08/21/2011) Continue home regimen of synthroid  Volume depletion (10/18/2011) Currently on normal saline. I and O's still positive.  Bacteremia (10/19/2011) Patient is currently on Rocephin.  Will require a 14 day total course of antibiotics today is day 6/14  Constipation (10/20/2011) Improved with current regimen. Abdominal discomfort resolved.      LOS: 5 days   Penny Pia M.D.  [facility] 10/22/2011, 4:41 PM

## 2011-10-22 NOTE — Progress Notes (Signed)
Report called to floor RN by Eden Emms RN and pt transferred by wheelchair to [phone]

## 2011-10-22 NOTE — Progress Notes (Signed)
S: Feels 'bloated like the Lenoria Farrier doughboy' Mostly abdominal bloating Says "making a lot of urine" (although not large amts recorded) Off the insulin drip O: Remains on bicarb drip CO2 coming up Medications: Infusions:    .  sodium bicarbonate infusion 1000 mL 100 mL/hr at 10/22/11 1314  . DISCONTD: sodium chloride    . DISCONTD: insulin (NOVOLIN-R) infusion 2.8 Units/hr (10/21/11 2137)  . DISCONTD:  sodium bicarbonate infusion 1000 mL 125 mL/hr at 10/21/11 2050   Scheduled Medications:    . cefTRIAXone (ROCEPHIN)  IV  1 g Intravenous QPC supper  . docusate sodium  100 mg Oral BID  . heparin  5,000 Units Subcutaneous Q8H  . insulin aspart  0-15 Units Subcutaneous Q4H  . insulin glargine  10 Units Subcutaneous QHS  . insulin glargine  10 Units Subcutaneous NOW  . levothyroxine  75 mcg Oral Daily  . pantoprazole  40 mg Oral Q1200  . polyethylene glycol  17 g Oral Daily  . potassium chloride  10 mEq Intravenous Q1 Hr x 4  . potassium chloride  40 mEq Oral Once  . sodium chloride  10-40 mL Intracatheter Q12H  . sodium chloride  3 mL Intravenous Q12H  . DISCONTD: insulin aspart  0-15 Units Subcutaneous TID WC  . DISCONTD: insulin aspart  0-15 Units Subcutaneous TID WC  . DISCONTD: insulin aspart  0-5 Units Subcutaneous QHS  . DISCONTD: insulin aspart  0-5 Units Subcutaneous QHS  . DISCONTD: insulin regular  0-10 Units Intravenous TID WC    PRN Meds:.acetaminophen, dextrose, naproxen, ondansetron (ZOFRAN) IV, ondansetron, sodium chloride  BP 124/78  Pulse 107  Temp(Src) 100.5 F (38.1 C) (Oral)  Resp 18  Ht 5\' 2"  (1.575 m)  Wt 65.2 kg (143 lb 11.8 oz)  BMI 26.29 kg/m2  SpO2 96%  LMP 09/20/2011   Intake/Output Summary (Last 24 hours) at 10/22/11 1532 Last data filed at 10/22/11 0600  Gross per 24 hour  Intake   2345 ml  Output    400 ml  Net   1945 ml  I/O last 3 completed shifts: In: 5550.3 [P.O.:280; I.V.:4820.3; IV Piggyback:450] Out: 400 [Urine:400]   No  weights  EXAM: Small framed AAF  NAD  Not on O2 Gen:Not acutely ill appearing ZOX:WRUEAVW S1S2  Prominent and displaced PMI Resp:Lungs clear  UJW:JXBJYN distended.  Tattoo on left side.  Bowel sounds active.  Mildly tender in lower quadrants Ext:No significant LE edema  Labs: Basic Metabolic Panel:  Lab 10/22/11 8295 10/22/11 0850 10/22/11 0303  NA 133* 129* 132*  K 4.0 4.2 3.1*  CL 102 101 101  CO2 21 19 20   GLUCOSE 214* 215* 130*  BUN 23 24* 25*  CREATININE 2.45* 2.54* 2.56*  CALCIUM 7.1* 7.1* 7.5*  MG -- -- --  PHOS -- -- --    Lab 10/17/11 2345  AST 63*  ALT 42*  ALKPHOS 127*  BILITOT 0.7  PROT 7.3  ALBUMIN 2.8*    Lab 10/17/11 2345  LIPASE 9*  AMYLASE --   No results found for this basename: AMMONIA:3 in the last 168 hours  CBC:  Lab 10/21/11 0350 10/20/11 0402 10/19/11 0847 10/18/11 0620 10/17/11 2345  WBC 9.3 7.0 6.8 -- --  NEUTROABS -- -- -- -- 4.0  HGB 9.4* 10.2* 10.2* -- --  HCT 27.4* 29.2* 30.1* -- --  MCV 88.7 88.5 89.1 87.2 91.9  PLT 106* 97* 99* -- --   CBG:  Lab 10/22/11 1200 10/22/11 0732 10/22/11 0300 10/22/11  0153 10/22/11 0046  GLUCAP 179* 244* 127* 146* 143*    Iron Studies:  Basename 10/21/11 1600  IRON 26*  TIBC 159*  TRANSFERRIN --  FERRITIN 219   ABG    Component Value Date/Time   PHART 7.339* 10/21/2011 0830   PCO2ART 20.0* 10/21/2011 0830   PO2ART 96.3 10/21/2011 0830   HCO3 10.5* 10/21/2011 0830   TCO2 9.9 10/21/2011 0830   ACIDBASEDEF 13.7* 10/21/2011 0830   O2SAT 94.7 10/21/2011 0830   ANA negative UA - large blood, 11-20 WBC, 21-50 RBC  (prev small blood, 3-6 W, 0-2 R) Urine culture >100,000 E. Coli No additional ketone data (5/2, 5/5 - moderate acetone) US Renal  10/21/2011  *RADIOLOGY REPORT*  Clinical Data: Acute renal failure.  RENAL/URINARY TRACT ULTRASOUND COMPLETE  Comparison:  None  Findings:  Right Kidney:  10.6 cm in length.  Normal renal cortical thickness but overall slight increased echogenicity which may  suggest medical renal disease.  No hydronephrosis or renal calculi.  Left Kidney:  12.7 cm in length.  Normal renal cortical thickness but overall slight increased echogenicity.  No hydronephrosis.  The perinephric fluid collection is noted at the superior pole.  Bladder:  Normal.  Additional findings:  Fluid noted in the hepatorenal fossa on the right and there are bilateral pleural effusions.  IMPRESSION:  1.  Slight increased echogenicity of both kidneys suggesting medical renal disease. 2.  No hydronephrosis. 3.  Bilateral pleural effusions and abdominal fluid collections.  Original Report Authenticated By: P. Loralie Champagne, M.D.   Assessment/Plan: 1. Acute Renal failure: Suspect ATN (Likely ischemic) given minimal improvement in renal function from IVFs, initial UA not convincing of GN process but followup shows blood, protein, WBC's (also however has significant UTI)  ANA is negative.  Will send complements and ANCA.  Not sure why she would have an acute GN. US shows somewhat echodense kidneys. UPC about 510 mg Still could all be ischemic ATN and infection with underlying diabetic disease. Baseline creatinine is 0.7 to 0.9.   2. Metabolic acidosis with anion gap: Suspect that this is bifactorial from acute renal failure and diabetic ketoacidosis (moderate ketones yesterday). Lactic acid levels are negative and I will request for salicylate levels. Based on the delta-delta equation, there still does not appear to be a significant non-anion gap component and respiratory compensation seems to be appropriate with a PCO2 of 20. In spite of this being an organic acidosis, we gave sodium bicarbonate and the CO2 is coming up nicely. Given the fact that she is in acute renal failure, it would be futile to do any urine electrolyte studies to understand a non-anion gap metabolic acidosis. With CO2 up to 21 will d/c the IV bicarb and place her on a small dose of PO replacement. Change IVF to normal saline and  decrease rate to keep volume open (she is quite volume replete at this time and has bilateral effusions on CXR)  3. Hyponatremia: Secondary to hyper glycemia and impaired water handling by an acutely injured kidney. Sodium is improving  4. Diabetic ketoacidosis: Per primary service - insulin drip has been d/c'd..   5. Anemia: Young menstruating woman likely with associated anemia. Iron is low at 26 with low calculated t-sat.  Would benefit from repletion.  6. E Coli UTI - on rocephin.  Sensitivities pending.  Tracen Mahler B

## 2011-10-23 DIAGNOSIS — E872 Acidosis, unspecified: Secondary | ICD-10-CM

## 2011-10-23 DIAGNOSIS — N179 Acute kidney failure, unspecified: Secondary | ICD-10-CM

## 2011-10-23 DIAGNOSIS — E1010 Type 1 diabetes mellitus with ketoacidosis without coma: Secondary | ICD-10-CM

## 2011-10-23 DIAGNOSIS — R7881 Bacteremia: Secondary | ICD-10-CM

## 2011-10-23 LAB — CBC
HCT: 23.3 % — ABNORMAL LOW (ref 36.0–46.0)
MCHC: 34.8 g/dL (ref 30.0–36.0)
MCV: 87.9 fL (ref 78.0–100.0)
Platelets: 230 10*3/uL (ref 150–400)
RDW: 13.9 % (ref 11.5–15.5)
WBC: 11.5 10*3/uL — ABNORMAL HIGH (ref 4.0–10.5)

## 2011-10-23 LAB — BASIC METABOLIC PANEL
BUN: 19 mg/dL (ref 6–23)
Chloride: 103 mEq/L (ref 96–112)
Creatinine, Ser: 2.51 mg/dL — ABNORMAL HIGH (ref 0.50–1.10)
Glucose, Bld: 153 mg/dL — ABNORMAL HIGH (ref 70–99)
Potassium: 3.9 mEq/L (ref 3.5–5.1)

## 2011-10-23 LAB — GLUCOSE, CAPILLARY: Glucose-Capillary: 130 mg/dL — ABNORMAL HIGH (ref 70–99)

## 2011-10-23 LAB — PHOSPHORUS: Phosphorus: 2.1 mg/dL — ABNORMAL LOW (ref 2.3–4.6)

## 2011-10-23 LAB — ALBUMIN: Albumin: 1.5 g/dL — ABNORMAL LOW (ref 3.5–5.2)

## 2011-10-23 MED ORDER — CIPROFLOXACIN HCL 500 MG PO TABS
500.0000 mg | ORAL_TABLET | Freq: Two times a day (BID) | ORAL | Status: DC
  Administered 2011-10-23: 500 mg via ORAL
  Filled 2011-10-23 (×2): qty 1

## 2011-10-23 MED ORDER — INSULIN ASPART 100 UNIT/ML ~~LOC~~ SOLN
0.0000 [IU] | SUBCUTANEOUS | Status: DC
  Administered 2011-10-23: 3 [IU] via SUBCUTANEOUS

## 2011-10-23 MED ORDER — SODIUM BICARBONATE 650 MG PO TABS: 650.0000 mg | ORAL_TABLET | Freq: Three times a day (TID) | ORAL | Status: AC

## 2011-10-23 MED ORDER — INSULIN ASPART 100 UNIT/ML ~~LOC~~ SOLN: SUBCUTANEOUS | Status: DC

## 2011-10-23 MED ORDER — CIPROFLOXACIN HCL 500 MG PO TABS: 500.0000 mg | ORAL_TABLET | Freq: Two times a day (BID) | ORAL | Status: AC

## 2011-10-23 NOTE — Progress Notes (Signed)
Received call from Dr. Cena Benton that he plans to discharge patient from the hospital.  She will need followup with her primary care within the next week or so for labs, as her renal function is "stuck" at creatinines in the mid 2's despite more than adequate hydration, no nephrotoxins, correction of DKA. ANA is negative, UPC only about 500 mg, ANCA and complements have been drawn and are pending. (baseline creatinine was normal at 0.96 in March, 1.28 on April 4).  I can see in hospital followup on 11/14/11 at [facility], [address], [place]  [phone] - she should arrive at 11:30 for a 12 Noon appointment, bring all meds with her.  Vontrell Pullman B

## 2011-10-23 NOTE — Progress Notes (Signed)
CSW consulted with the pt. The pt was dismissive and hesitant upon hearing  The title Social Worker, identifying " she did not need any help from SW". Pt was however able to engage in coverstaion and was willing to think about services if it became a need. Pt was also okay with periodic checks in to see how things were going.  Kayleen Memos. Leighton Ruff [phone]

## 2011-10-23 NOTE — Progress Notes (Signed)
Patient refuses ABG at this time. RN aware. RT to try again. Will reschedule for 8 am.

## 2011-10-23 NOTE — Progress Notes (Signed)
Inpatient Diabetes   CBGs much improved today.  Upon entering room, pt looked up and said "I just want to go home, I want to go home.  I've been here long enough." Pt states her insurance ran out and she had to pay for her Lantus this past month.  Said she has reapplied for insurance.  When questioned about monitoring blood sugars at home, she said she had ran out of the test strips.  States that her stomach hurts all the time and she's been known to go almost a week without eating.  Discussed importance of checking blood sugars and taking insulin even if she doesn't feel good.  Explained importance of glucose control long term and goal of HgbA1C. Pt voiced understanding, but continued to say she wants to go home.  Said she just moved in with her boyfriend and has grandmother for support person.  "I don't talk to my Mom that much." Discussed pt in huddle.  Results for JAMYE, BALICKI (MRN [id number]) as of 10/23/2011 14:31  Ref. Range 10/17/2011 23:45  Hemoglobin A1C Latest Range: <5.7 % 11.9 (H)  Results for LACE, CHENEVERT (MRN [id number]) as of 10/23/2011 14:31  Ref. Range 10/22/2011 07:32 10/22/2011 12:00 10/22/2011 16:39 10/22/2011 20:01 10/22/2011 23:56 10/23/2011 03:59 10/23/2011 07:42 10/23/2011 11:48  Glucose-Capillary Latest Range: 70-99 mg/dL 782 (H) 956 (H) 213 (H) 148 (H) 147 (H) 141 (H) 160 (H) 103 (H)  Will coninue to follow.

## 2011-10-23 NOTE — Progress Notes (Signed)
Pt refused ABG for second time. Pt states she being D/C today.

## 2011-10-23 NOTE — Progress Notes (Signed)
RUA TL PICC d/c'd intact 39cm, vaseline gauze applied and pressure held for 3 min. No bleeding noted. Instructed pt. To leave dsg on for 24 hours then remove. Pt. Was instructed that we like pt.'s to remain in bed for at least 20 minutes.

## 2011-10-23 NOTE — Progress Notes (Signed)
Pt was already scheduled for [facility] appointment 11/13/11 at 1500 with Dr. Clelia Croft. This was confirmed by speaking with [facility] Secretary [phone].   Spoke with pt concerning appointment with [facility] and encouraged her to keep appointment.  Information for blood glucose was given to pt. She can purchase at [employer] or [facility] for under $20.00. mp

## 2011-10-23 NOTE — Discharge Summary (Signed)
Physician Discharge Summary  [name] Ross NWG:[id number] DOB: 1985/07/02 DOA: 10/17/2011  PCP: Norberto Sorenson, MD, MD  Admit date: 10/17/2011 Discharge date: 10/23/2011  Discharge Diagnoses:  1. DKA resolved 2. Metabolic acidosis (multifactorial 2ary to DKA and Renal failure) resolved 3. Hypothyroidism 4. Bacteremia 5. UTI   Discharge Condition: stable  Disposition: Home with follow up with physician ideally in one week.  As well as with nephrology on may 29th at 1130AM.  Please refer to discharge instructions below.  History of present illness:  Patient is a 26 year old AAF with history of DM I, hypothyroidism, and recurrent uti's.  Presents to the hospital after developing DKA.  Patient was found to be in metabolic acidosis and was started on glucostabilizer.  Also had a UTI.  Through the course of her admission she was found to be bacteremic and grew out E. Coli which was resistant to ampicillin and ampicillin/sulbactam but was sensitive to everything else.  She was placed on Rocephin.  Hospital Course:  Through the course of her admission her acidemia was getting corrected and thus I decided to transition patient off of the glucostabilizer and place on her home regimen.  Patient developed acidemia again and I had to place on glucostabilizer.  Her serum was still showing moderate ketones in her blood but her acidemia got worse.  I decided to add sodium bicarb gtt and consulted Nephrology.  Nephrology felt that this was a mixed metabolic acidosis secondary to DKA and renal failure.  Patient's creatinine remained around 2.5.    On patient's sixth day of admission patient was requesting to go home.  Stated that she felt better reported eating and reassured me that she was going to follow up with physicians, take her antibiotics and sodium bicarb, as well as, purchase a glucometer and take her diabetic medication.  I have recommended that the patient obtain a BMP as outpatient.  Would prefer this  to occur in one week.  She can go to [facility] lab test now in [place] to obtain this.  She verbalizes understanding and agreement.  Also she is to call the results into her primary care physician.  She has an appointment with nephrology which she is to keep.  Discharge Exam: Filed Vitals:   10/23/11 1444  BP: 118/72  Pulse: 118  Temp: 99.7 F (37.6 C)  Resp: 16   Filed Vitals:   10/23/11 0300 10/23/11 0402 10/23/11 0912 10/23/11 1444  BP:  110/67 105/66 118/72  Pulse:  110 92 118  Temp: 99 F (37.2 C) 99.9 F (37.7 C) 98.7 F (37.1 C) 99.7 F (37.6 C)  TempSrc: Oral Oral Oral Oral  Resp:  20 18 16   Height:      Weight:      SpO2:  91% 95% 94%   General: Alert, awake, oriented x3, in no acute distress. HEENT: atraumatic, normocephalic, No bruits, no goiter. Heart: Regular rate and rhythm, without murmurs, rubs, gallops. Lungs: Clear to auscultation bilaterally. Abdomen: Soft, nontender, nondistended, positive bowel sounds. Extremities: No clubbing cyanosis or edema with positive pedal pulses. Neuro: Grossly intact, nonfocal.   Discharge Instructions  Discharge Orders    Future Orders Please Complete By Expires   Diet - low sodium heart healthy      Increase activity slowly      Discharge instructions      Comments:   Please go to [employer] and buy a 9 dollar glucometer.  Check and record your blood sugars.  Take and finish all  of your antibiotics.   Follow up with: Dr. Raynelle Highland on May 29th at 1130 AM on 5/13.  Please call results into your primary care physician for further instructions. She is located at [address] [facility] [place] [address]  Also have appointment [facility].  Go to any lab test now and obtain a BMP     Medication List  As of 10/23/2011  5:02 PM   TAKE these medications         ciprofloxacin 500 MG tablet   Commonly known as: CIPRO   Take 1 tablet (500 mg total) by mouth 2 (two) times daily.       insulin aspart 100 UNIT/ML injection   Commonly known as: novoLOG   Inject 0-15 units into the skin 3 times daily before meals per sliding scale.    CBG < 70: drink juice and or milk and call physician or 911     CBG 70 - 120: 0 units       CBG > 400 call MD and obtain STAT lab verification       CBG 121 - 150: 2 units       CBG 151 - 200: 3 units       CBG 201 - 250: 5 units       CBG 251 - 300: 8 units       CBG 301 - 350: 11 units       CBG 351 - 400: 15 units      insulin glargine 100 UNIT/ML injection   Commonly known as: LANTUS   Inject 10-15 Units into the skin 2 (two) times daily. 15 in the morning and 10 units at night      levothyroxine 75 MCG tablet   Commonly known as: SYNTHROID, LEVOTHROID   Take 1 tablet (75 mcg total) by mouth daily.      sodium bicarbonate 650 MG tablet   Take 1 tablet (650 mg total) by mouth 3 (three) times daily.           Follow-up Information    Follow up with [facility] on 11/13/2011. (Appointment with Dr. Clelia Croft  at 3:00 pm)    Contact information:   [phone]          The results of significant diagnostics from this hospitalization (including imaging, microbiology, ancillary and laboratory) are listed below for reference.    Significant Diagnostic Studies: Dg Chest 2 View  10/18/2011  *RADIOLOGY REPORT*  Clinical Data: Fever, concern for sepsis  CHEST - 2 VIEW  Comparison: None.  Findings: Normal cardiac silhouette and mediastinal contours given decreased lung volumes.  Minimal bilateral infrahilar linear heterogeneous opacities favored to represent subsegmental atelectasis.  There is mild elevation of the right hemidiaphragm. No definite pleural effusion or pneumothorax.  No acute osseous abnormalities.  IMPRESSION: Decreased lung volumes with bilateral infrahilar linear opacities favored to represent subsegmental atelectasis.  No focal airspace opacities to suggest pneumonia.  Original Report Authenticated  By: Waynard Reeds, M.D.   US Renal  10/21/2011  *RADIOLOGY REPORT*  Clinical Data: Acute renal failure.  RENAL/URINARY TRACT ULTRASOUND COMPLETE  Comparison:  None  Findings:  Right Kidney:  10.6 cm in length.  Normal renal cortical thickness but overall slight increased echogenicity which may suggest medical renal disease.  No hydronephrosis or renal calculi.  Left Kidney:  12.7 cm in length.  Normal renal cortical thickness but overall slight increased  echogenicity.  No hydronephrosis.  The perinephric fluid collection is noted at the superior pole.  Bladder:  Normal.  Additional findings:  Fluid noted in the hepatorenal fossa on the right and there are bilateral pleural effusions.  IMPRESSION:  1.  Slight increased echogenicity of both kidneys suggesting medical renal disease. 2.  No hydronephrosis. 3.  Bilateral pleural effusions and abdominal fluid collections.  Original Report Authenticated By: P. Loralie Champagne, M.D.   Dg Abd 2 Views  10/18/2011  *RADIOLOGY REPORT*  Clinical Data: Generalized abdominal pain, constipation  ABDOMEN - 2 VIEW  Comparison: 08/21/2011  Findings: Nonobstructive bowel gas pattern.  Moderate stool in the rectum.  No evidence of free air under the diaphragm on the upright view.  Visualized osseous structures are within normal limits.  IMPRESSION: No evidence of small bowel obstruction or free air.  Moderate stool in the rectum.  Original Report Authenticated By: Charline Bills, M.D.    Microbiology: Recent Results (from the past 240 hour(s))  CULTURE, BLOOD (ROUTINE X 2)     Status: Normal   Collection Time   10/18/11 12:29 AM      Component Value Range Status Comment   Specimen Description BLOOD LEFT ANTECUBITAL   Final    Special Requests BOTTLES DRAWN AEROBIC AND ANAEROBIC 5CC   Final    Culture  Setup Time [id number]   Final    Culture     Final    Value: ESCHERICHIA COLI     Note: SUSCEPTIBILITIES PERFORMED ON PREVIOUS CULTURE WITHIN THE LAST 5 DAYS.      Note: Gram Stain Report Called to,Read Back By and Verified With: PAM WEST @ 1825 ON 10/18/11 BY GOLLD   Report Status 10/20/2011 FINAL   Final   CULTURE, BLOOD (ROUTINE X 2)     Status: Normal   Collection Time   10/18/11 12:34 AM      Component Value Range Status Comment   Specimen Description BLOOD LEFT HAND   Final    Special Requests BOTTLES DRAWN AEROBIC AND ANAEROBIC 4CC   Final    Culture  Setup Time [id number]   Final    Culture     Final    Value: ESCHERICHIA COLI     Note: Gram Stain Report Called to,Read Back By and Verified With: PAM WEST 10/18/11 15:00 BY GARRS   Report Status 10/20/2011 FINAL   Final    Organism ID, Bacteria ESCHERICHIA COLI   Final   URINE CULTURE     Status: Normal   Collection Time   10/18/11  1:00 AM      Component Value Range Status Comment   Specimen Description URINE, CLEAN CATCH   Final    Special Requests NONE   Final    Culture  Setup Time [id number]   Final    Colony Count >=100,000 COLONIES/ML   Final    Culture ESCHERICHIA COLI   Final    Report Status 10/19/2011 FINAL   Final    Organism ID, Bacteria ESCHERICHIA COLI   Final   MRSA PCR SCREENING     Status: Normal   Collection Time   10/18/11  6:05 AM      Component Value Range Status Comment   MRSA by PCR NEGATIVE  NEGATIVE  Final      Labs: Basic Metabolic Panel:  Lab 10/23/11 3016 10/22/11 1400 10/22/11 0850 10/22/11 0303 10/21/11 2245  NA 134* 133* 129* 132* 134*  K 3.9 4.0 -- -- --  CL  103 102 101 101 107  CO2 22 21 19 20  18*  GLUCOSE 153* 214* 215* 130* 136*  BUN 19 23 24* 25* 25*  CREATININE 2.51* 2.45* 2.54* 2.56* 2.33*  CALCIUM 7.1* 7.1* 7.1* 7.5* 6.5*  MG -- -- -- -- --  PHOS 2.1* -- -- -- --   Liver Function Tests:  Lab 10/23/11 0530 10/17/11 2345  AST -- 63*  ALT -- 42*  ALKPHOS -- 127*  BILITOT -- 0.7  PROT -- 7.3  ALBUMIN 1.5* 2.8*    Lab 10/17/11 2345  LIPASE 9*  AMYLASE --   No results found for this basename: AMMONIA:5 in the last 168  hours CBC:  Lab 10/23/11 0530 10/21/11 0350 10/20/11 0402 10/19/11 0847 10/18/11 0620 10/17/11 2345  WBC 11.5* 9.3 7.0 6.8 6.3 --  NEUTROABS -- -- -- -- -- 4.0  HGB 8.1* 9.4* 10.2* 10.2* 10.6* --  HCT 23.3* 27.4* 29.2* 30.1* 29.9* --  MCV 87.9 88.7 88.5 89.1 87.2 --  PLT 230 106* 97* 99* 120* --   Cardiac Enzymes: No results found for this basename: CKTOTAL:5,CKMB:5,CKMBINDEX:5,TROPONINI:5 in the last 168 hours BNP: No components found with this basename: POCBNP:5 CBG:  Lab 10/23/11 1148 10/23/11 0742 10/23/11 0359 10/22/11 2356 10/22/11 2001  GLUCAP 103* 160* 141* 147* 148*    Time coordinating discharge: > 70 minutes  Signed:  Penny Pia, MD  [facility] 10/23/2011, 5:02 PM
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
## 2011-10-23 NOTE — Progress Notes (Signed)
Had discussion with patient today several times. Patient was insistent about discharge today and was considering leaving AMA.  I recommended that the patient stay for further monitoring but she refused.  Rather than have the patient leave AMA without any medication for treatment of her bacteremia secondary to E coli infection and diabetes; I decided to discharge the patient with scripts and follow ups to her primary care physician and nephrologist.  Patient understands risks associated with this and prefers to be discharged today despite my recommendations for further observation.    Please see d/c instructions as I have reviewed these > 2 times with patient and she verbalizes and expresses agreement.  I have made sure that she receive her Ciprofloxacin 500mg  po tonight so that she can fill her antibiotic prescription either tonight or tomorrow in the am.  Penny Pia 10/23/11

## 2011-10-24 NOTE — Progress Notes (Signed)
Referral received for Social Work services. pt plans to D/C home. CSW will sign off, please re consult of CSW needs arise.   Kayleen Memos. Leighton Ruff [phone]

## 2011-10-25 ENCOUNTER — Inpatient Hospital Stay: Payer: Self-pay | Admitting: Internal Medicine

## 2011-10-25 LAB — URINALYSIS, COMPLETE
Glucose,UR: >=500 mg/dL (ref 0–75)
Ph: 6 (ref 4.5–8.0)
Protein: >=500
RBC,UR: 333 /HPF (ref 0–5)
Squamous Epithelial: 2
WBC UR: 921 /HPF (ref 0–5)

## 2011-10-25 LAB — BASIC METABOLIC PANEL
Anion Gap: 24 — ABNORMAL HIGH (ref 7–16)
Anion Gap: 23 — ABNORMAL HIGH (ref 7–16)
BUN: 29 mg/dL — ABNORMAL HIGH (ref 7–18)
Chloride: 101 mmol/L (ref 98–107)
Chloride: 104 mmol/L (ref 98–107)
EGFR (African American): 21 — ABNORMAL LOW
EGFR (African American): 21 — ABNORMAL LOW
EGFR (Non-African Amer.): 18 — ABNORMAL LOW
EGFR (Non-African Amer.): 19 — ABNORMAL LOW
Glucose: 640 mg/dL (ref 65–99)
Glucose: 485 mg/dL — ABNORMAL HIGH (ref 65–99)
Osmolality: 304 (ref 275–301)
Potassium: 6.9 mmol/L (ref 3.5–5.1)
Potassium: 5.4 mmol/L — ABNORMAL HIGH (ref 3.5–5.1)
Sodium: 131 mmol/L — ABNORMAL LOW (ref 136–145)

## 2011-10-25 LAB — CBC
HCT: 28.3 % — ABNORMAL LOW (ref 35.0–47.0)
HGB: 8.1 g/dL — ABNORMAL LOW (ref 12.0–16.0)
MCH: 30.8 pg (ref 26.0–34.0)
MCHC: 28.6 g/dL — ABNORMAL LOW (ref 32.0–36.0)
MCV: 108 fL — ABNORMAL HIGH (ref 80–100)
Platelet: 505 10*3/uL — ABNORMAL HIGH (ref 150–440)
RBC: 2.62 10*6/uL — ABNORMAL LOW (ref 3.80–5.20)
RDW: 16.5 % — ABNORMAL HIGH (ref 11.5–14.5)
WBC: 25.2 10*3/uL — ABNORMAL HIGH (ref 3.6–11.0)

## 2011-10-25 LAB — COMPREHENSIVE METABOLIC PANEL
Albumin: 2.1 g/dL — ABNORMAL LOW (ref 3.4–5.0)
Anion Gap: 27 — ABNORMAL HIGH (ref 7–16)
BUN: 26 mg/dL — ABNORMAL HIGH (ref 7–18)
Bilirubin,Total: 0.5 mg/dL (ref 0.2–1.0)
Calcium, Total: 7.9 mg/dL — ABNORMAL LOW (ref 8.5–10.1)
Chloride: 99 mmol/L (ref 98–107)
Co2: 5 mmol/L — CL (ref 21–32)
Creatinine: 3.21 mg/dL — ABNORMAL HIGH (ref 0.60–1.30)
EGFR (Non-African Amer.): 19 — ABNORMAL LOW
Glucose: 645 mg/dL (ref 65–99)
Osmolality: 298 (ref 275–301)
Potassium: 6.3 mmol/L — ABNORMAL HIGH (ref 3.5–5.1)
SGOT(AST): 14 U/L — ABNORMAL LOW (ref 15–37)
SGPT (ALT): 13 U/L
Sodium: 131 mmol/L — ABNORMAL LOW (ref 136–145)
Total Protein: 6.9 g/dL (ref 6.4–8.2)

## 2011-10-26 LAB — CBC WITH DIFFERENTIAL/PLATELET
Basophil #: 0 10*3/uL (ref 0.0–0.1)
Basophil #: 0 10*3/uL (ref 0.0–0.1)
Basophil %: 0 %
Eosinophil #: 0 10*3/uL (ref 0.0–0.7)
Eosinophil %: 0 %
HCT: 26 % — ABNORMAL LOW (ref 35.0–47.0)
HGB: 6.4 g/dL — ABNORMAL LOW (ref 12.0–16.0)
HGB: 8.8 g/dL — ABNORMAL LOW (ref 12.0–16.0)
Lymphocyte #: 0.6 10*3/uL — ABNORMAL LOW (ref 1.0–3.6)
Lymphocyte #: 1 10*3/uL (ref 1.0–3.6)
Lymphocyte %: 5.6 %
Lymphocyte %: 5.7 %
MCH: 30.3 pg (ref 26.0–34.0)
MCH: 30.4 pg (ref 26.0–34.0)
MCHC: 33 g/dL (ref 32.0–36.0)
MCHC: 33.7 g/dL (ref 32.0–36.0)
MCV: 90 fL (ref 80–100)
Monocyte %: 1.7 %
Neutrophil %: 92 %
Neutrophil %: 92.6 %
Platelet: 262 10*3/uL (ref 150–440)
RDW: 14.2 % (ref 11.5–14.5)
WBC: 11.2 10*3/uL — ABNORMAL HIGH (ref 3.6–11.0)
WBC: 16.8 10*3/uL — ABNORMAL HIGH (ref 3.6–11.0)

## 2011-10-26 LAB — BASIC METABOLIC PANEL
Anion Gap: 20 — ABNORMAL HIGH (ref 7–16)
Anion Gap: 18 — ABNORMAL HIGH (ref 7–16)
Anion Gap: 17 — ABNORMAL HIGH (ref 7–16)
Anion Gap: 15 (ref 7–16)
Anion Gap: 12 (ref 7–16)
Anion Gap: 11 (ref 7–16)
BUN: 25 mg/dL — ABNORMAL HIGH (ref 7–18)
BUN: 23 mg/dL — ABNORMAL HIGH (ref 7–18)
BUN: 23 mg/dL — ABNORMAL HIGH (ref 7–18)
BUN: 16 mg/dL (ref 7–18)
BUN: 13 mg/dL (ref 7–18)
Calcium, Total: 7.1 mg/dL — ABNORMAL LOW (ref 8.5–10.1)
Calcium, Total: 6.9 mg/dL — CL (ref 8.5–10.1)
Calcium, Total: 6.9 mg/dL — CL (ref 8.5–10.1)
Calcium, Total: 6.9 mg/dL — CL (ref 8.5–10.1)
Calcium, Total: 7 mg/dL — CL (ref 8.5–10.1)
Calcium, Total: 7.1 mg/dL — ABNORMAL LOW (ref 8.5–10.1)
Calcium, Total: 7.4 mg/dL — ABNORMAL LOW (ref 8.5–10.1)
Calcium, Total: 7.1 mg/dL — ABNORMAL LOW (ref 8.5–10.1)
Chloride: 108 mmol/L — ABNORMAL HIGH (ref 98–107)
Chloride: 109 mmol/L — ABNORMAL HIGH (ref 98–107)
Chloride: 111 mmol/L — ABNORMAL HIGH (ref 98–107)
Chloride: 110 mmol/L — ABNORMAL HIGH (ref 98–107)
Co2: 17 mmol/L — ABNORMAL LOW (ref 21–32)
Co2: 19 mmol/L — ABNORMAL LOW (ref 21–32)
Co2: 20 mmol/L — ABNORMAL LOW (ref 21–32)
Co2: 21 mmol/L (ref 21–32)
Co2: 23 mmol/L (ref 21–32)
Co2: 25 mmol/L (ref 21–32)
Co2: 26 mmol/L (ref 21–32)
Co2: 24 mmol/L (ref 21–32)
Creatinine: 3.2 mg/dL — ABNORMAL HIGH (ref 0.60–1.30)
Creatinine: 3.06 mg/dL — ABNORMAL HIGH (ref 0.60–1.30)
Creatinine: 3.13 mg/dL — ABNORMAL HIGH (ref 0.60–1.30)
Creatinine: 3.07 mg/dL — ABNORMAL HIGH (ref 0.60–1.30)
Creatinine: 2.72 mg/dL — ABNORMAL HIGH (ref 0.60–1.30)
Creatinine: 2.25 mg/dL — ABNORMAL HIGH (ref 0.60–1.30)
EGFR (African American): 22 — ABNORMAL LOW
EGFR (African American): 24 — ABNORMAL LOW
EGFR (African American): 23 — ABNORMAL LOW
EGFR (African American): 25 — ABNORMAL LOW
EGFR (African American): 27 — ABNORMAL LOW
EGFR (African American): 34 — ABNORMAL LOW
EGFR (African American): 39 — ABNORMAL LOW
EGFR (Non-African Amer.): 20 — ABNORMAL LOW
EGFR (Non-African Amer.): 22 — ABNORMAL LOW
EGFR (Non-African Amer.): 24 — ABNORMAL LOW
EGFR (Non-African Amer.): 30 — ABNORMAL LOW
EGFR (Non-African Amer.): 33 — ABNORMAL LOW
Glucose: 307 mg/dL — ABNORMAL HIGH (ref 65–99)
Glucose: 266 mg/dL — ABNORMAL HIGH (ref 65–99)
Glucose: 236 mg/dL — ABNORMAL HIGH (ref 65–99)
Glucose: 190 mg/dL — ABNORMAL HIGH (ref 65–99)
Glucose: 154 mg/dL — ABNORMAL HIGH (ref 65–99)
Glucose: 248 mg/dL — ABNORMAL HIGH (ref 65–99)
Osmolality: 304 (ref 275–301)
Osmolality: 301 (ref 275–301)
Osmolality: 298 (ref 275–301)
Osmolality: 297 (ref 275–301)
Osmolality: 294 (ref 275–301)
Osmolality: 290 (ref 275–301)
Osmolality: 295 (ref 275–301)
Potassium: 4.2 mmol/L (ref 3.5–5.1)
Potassium: 3.8 mmol/L (ref 3.5–5.1)
Potassium: 3.6 mmol/L (ref 3.5–5.1)
Potassium: 3.2 mmol/L — ABNORMAL LOW (ref 3.5–5.1)
Potassium: 3.2 mmol/L — ABNORMAL LOW (ref 3.5–5.1)
Sodium: 143 mmol/L (ref 136–145)
Sodium: 145 mmol/L (ref 136–145)
Sodium: 144 mmol/L (ref 136–145)
Sodium: 146 mmol/L — ABNORMAL HIGH (ref 136–145)
Sodium: 146 mmol/L — ABNORMAL HIGH (ref 136–145)
Sodium: 146 mmol/L — ABNORMAL HIGH (ref 136–145)
Sodium: 145 mmol/L (ref 136–145)
Sodium: 144 mmol/L (ref 136–145)

## 2011-10-26 LAB — PREGNANCY, URINE: Pregnancy Test, Urine: NEGATIVE m[IU]/mL

## 2011-10-27 LAB — BASIC METABOLIC PANEL
Anion Gap: 10 (ref 7–16)
BUN: 11 mg/dL (ref 7–18)
Calcium, Total: 7.4 mg/dL — ABNORMAL LOW (ref 8.5–10.1)
EGFR (African American): 42 — ABNORMAL LOW
EGFR (African American): 45 — ABNORMAL LOW
EGFR (Non-African Amer.): 36 — ABNORMAL LOW
EGFR (Non-African Amer.): 38 — ABNORMAL LOW
Potassium: 3.7 mmol/L (ref 3.5–5.1)
Sodium: 142 mmol/L (ref 136–145)
Sodium: 141 mmol/L (ref 136–145)

## 2011-10-27 LAB — IRON AND TIBC
Iron Saturation: 35 %
Unbound Iron-Bind.Cap.: 122 ug/dL

## 2011-10-27 LAB — CBC WITH DIFFERENTIAL/PLATELET
Basophil %: 0.3 %
Eosinophil #: 0 10*3/uL (ref 0.0–0.7)
Eosinophil %: 0 %
HCT: 23.8 % — ABNORMAL LOW (ref 35.0–47.0)
HGB: 8 g/dL — ABNORMAL LOW (ref 12.0–16.0)
Lymphocyte #: 0.5 10*3/uL — ABNORMAL LOW (ref 1.0–3.6)
MCH: 30.4 pg (ref 26.0–34.0)
Monocyte %: 2.2 %
Neutrophil %: 95 %
Platelet: 194 10*3/uL (ref 150–440)
RBC: 2.64 10*6/uL — ABNORMAL LOW (ref 3.80–5.20)
WBC: 18.5 10*3/uL — ABNORMAL HIGH (ref 3.6–11.0)

## 2011-10-27 LAB — FERRITIN: Ferritin (ARMC): 189 ng/mL (ref 8–388)

## 2011-10-28 LAB — URINALYSIS, COMPLETE
Bilirubin,UR: NEGATIVE
Glucose,UR: NEGATIVE mg/dL (ref 0–75)
Ketone: NEGATIVE
Ph: 6 (ref 4.5–8.0)
Protein: NEGATIVE
RBC,UR: 13 /HPF (ref 0–5)

## 2011-10-28 LAB — CBC WITH DIFFERENTIAL/PLATELET
Basophil #: 0 10*3/uL (ref 0.0–0.1)
Basophil %: 0.1 %
HCT: 25.2 % — ABNORMAL LOW (ref 35.0–47.0)
HGB: 8.5 g/dL — ABNORMAL LOW (ref 12.0–16.0)
Lymphocyte #: 0.5 10*3/uL — ABNORMAL LOW (ref 1.0–3.6)
Lymphocyte %: 2.9 %
MCH: 30.5 pg (ref 26.0–34.0)
MCHC: 33.7 g/dL (ref 32.0–36.0)
Monocyte #: 0.2 x10 3/mm (ref 0.2–0.9)
Neutrophil #: 17 10*3/uL — ABNORMAL HIGH (ref 1.4–6.5)
Neutrophil %: 95.6 %
Platelet: 209 10*3/uL (ref 150–440)
RBC: 2.79 10*6/uL — ABNORMAL LOW (ref 3.80–5.20)
RDW: 14.7 % — ABNORMAL HIGH (ref 11.5–14.5)

## 2011-10-28 LAB — BASIC METABOLIC PANEL
Chloride: 109 mmol/L — ABNORMAL HIGH (ref 98–107)
Creatinine: 2.09 mg/dL — ABNORMAL HIGH (ref 0.60–1.30)
EGFR (African American): 37 — ABNORMAL LOW
EGFR (Non-African Amer.): 32 — ABNORMAL LOW
Glucose: 96 mg/dL (ref 65–99)
Osmolality: 286 (ref 275–301)
Sodium: 144 mmol/L (ref 136–145)

## 2011-10-29 LAB — CBC WITH DIFFERENTIAL/PLATELET
Basophil %: 0.6 %
Eosinophil %: 0.1 %
HGB: 9.2 g/dL — ABNORMAL LOW (ref 12.0–16.0)
Lymphocyte #: 1.6 10*3/uL (ref 1.0–3.6)
Lymphocyte %: 11 %
MCH: 30.1 pg (ref 26.0–34.0)
MCV: 91 fL (ref 80–100)
Monocyte #: 0.5 x10 3/mm (ref 0.2–0.9)
Monocyte %: 3.3 %
Platelet: 220 10*3/uL (ref 150–440)
RBC: 3.06 10*6/uL — ABNORMAL LOW (ref 3.80–5.20)

## 2011-10-29 LAB — BASIC METABOLIC PANEL
BUN: 16 mg/dL (ref 7–18)
Calcium, Total: 7.5 mg/dL — ABNORMAL LOW (ref 8.5–10.1)
Creatinine: 2.33 mg/dL — ABNORMAL HIGH (ref 0.60–1.30)
EGFR (African American): 33 — ABNORMAL LOW
Osmolality: 283 (ref 275–301)
Potassium: 2.9 mmol/L — ABNORMAL LOW (ref 3.5–5.1)

## 2011-10-29 LAB — PHOSPHORUS: Phosphorus: 2.6 mg/dL (ref 2.5–4.9)

## 2011-10-30 LAB — CBC WITH DIFFERENTIAL/PLATELET
Basophil #: 0 10*3/uL (ref 0.0–0.1)
Basophil %: 0.4 %
Eosinophil %: 0.6 %
HCT: 26 % — ABNORMAL LOW (ref 35.0–47.0)
HGB: 8.8 g/dL — ABNORMAL LOW (ref 12.0–16.0)
Lymphocyte #: 1.7 10*3/uL (ref 1.0–3.6)
MCH: 30.6 pg (ref 26.0–34.0)
MCHC: 33.8 g/dL (ref 32.0–36.0)
MCV: 91 fL (ref 80–100)
Neutrophil #: 8.7 10*3/uL — ABNORMAL HIGH (ref 1.4–6.5)
Neutrophil %: 78.7 %
Platelet: 219 10*3/uL (ref 150–440)
RBC: 2.87 10*6/uL — ABNORMAL LOW (ref 3.80–5.20)
RDW: 14.8 % — ABNORMAL HIGH (ref 11.5–14.5)
WBC: 11.1 10*3/uL — ABNORMAL HIGH (ref 3.6–11.0)

## 2011-10-30 LAB — URINALYSIS, COMPLETE
Bilirubin,UR: NEGATIVE
Ketone: NEGATIVE
Ph: 7 (ref 4.5–8.0)
Protein: 30
RBC,UR: 78 /HPF (ref 0–5)
Specific Gravity: 1.009 (ref 1.003–1.030)
Squamous Epithelial: <1
WBC UR: 60 /HPF (ref 0–5)

## 2011-10-30 LAB — PROTEIN / CREATININE RATIO, URINE: Creatinine, Urine: 55.5 mg/dL (ref 30.0–125.0)

## 2011-10-30 LAB — BASIC METABOLIC PANEL
BUN: 19 mg/dL — ABNORMAL HIGH (ref 7–18)
Calcium, Total: 7.1 mg/dL — ABNORMAL LOW (ref 8.5–10.1)
Chloride: 109 mmol/L — ABNORMAL HIGH (ref 98–107)
Co2: 27 mmol/L (ref 21–32)
Creatinine: 2.65 mg/dL — ABNORMAL HIGH (ref 0.60–1.30)
EGFR (African American): 28 — ABNORMAL LOW
Glucose: 165 mg/dL — ABNORMAL HIGH (ref 65–99)
Osmolality: 297 (ref 275–301)

## 2011-10-31 LAB — CBC WITH DIFFERENTIAL/PLATELET
Eosinophil #: 0.1 10*3/uL (ref 0.0–0.7)
Eosinophil %: 0.8 %
HCT: 23.7 % — ABNORMAL LOW (ref 35.0–47.0)
Lymphocyte %: 22 %
MCH: 30.6 pg (ref 26.0–34.0)
Monocyte %: 5.2 %
Platelet: 194 10*3/uL (ref 150–440)
RBC: 2.61 10*6/uL — ABNORMAL LOW (ref 3.80–5.20)
WBC: 11.2 10*3/uL — ABNORMAL HIGH (ref 3.6–11.0)

## 2011-10-31 LAB — BASIC METABOLIC PANEL
EGFR (African American): 30 — ABNORMAL LOW
EGFR (Non-African Amer.): 26 — ABNORMAL LOW
Glucose: 161 mg/dL — ABNORMAL HIGH (ref 65–99)
Osmolality: 291 (ref 275–301)
Potassium: 3.7 mmol/L (ref 3.5–5.1)

## 2011-10-31 LAB — MAGNESIUM: Magnesium: 1.5 mg/dL — ABNORMAL LOW

## 2011-10-31 LAB — CULTURE, BLOOD (SINGLE)

## 2011-11-01 LAB — CBC WITH DIFFERENTIAL/PLATELET
Basophil #: 0.1 10*3/uL (ref 0.0–0.1)
Basophil %: 0.8 %
Eosinophil %: 1.1 %
HCT: 27.5 % — ABNORMAL LOW (ref 35.0–47.0)
Lymphocyte #: 2.6 10*3/uL (ref 1.0–3.6)
Lymphocyte %: 19.9 %
MCH: 31 pg (ref 26.0–34.0)
MCHC: 34 g/dL (ref 32.0–36.0)
MCV: 91 fL (ref 80–100)
Monocyte #: 0.6 x10 3/mm (ref 0.2–0.9)
Monocyte %: 4.4 %
Neutrophil %: 73.8 %
Platelet: 191 10*3/uL (ref 150–440)
RBC: 3.01 10*6/uL — ABNORMAL LOW (ref 3.80–5.20)

## 2011-11-01 LAB — MAGNESIUM: Magnesium: 1.8 mg/dL

## 2011-11-01 LAB — BASIC METABOLIC PANEL
Co2: 28 mmol/L (ref 21–32)
Creatinine: 2.41 mg/dL — ABNORMAL HIGH (ref 0.60–1.30)
EGFR (Non-African Amer.): 27 — ABNORMAL LOW
Glucose: 134 mg/dL — ABNORMAL HIGH (ref 65–99)

## 2011-11-05 LAB — MPO/PR-3 (ANCA) ANTIBODIES: Serine Protease 3: <1 AU/mL (ref <20)

## 2011-11-07 ENCOUNTER — Other Ambulatory Visit: Payer: Self-pay | Admitting: Nephrology

## 2011-11-07 LAB — RENAL FUNCTION PANEL
BUN: 20 mg/dL — ABNORMAL HIGH (ref 7–18)
Calcium, Total: 9.5 mg/dL (ref 8.5–10.1)
Chloride: 105 mmol/L (ref 98–107)
EGFR (African American): 33 — ABNORMAL LOW
EGFR (Non-African Amer.): 28 — ABNORMAL LOW
Osmolality: 277 (ref 275–301)
Phosphorus: 3.3 mg/dL (ref 2.5–4.9)
Potassium: 3.6 mmol/L (ref 3.5–5.1)

## 2011-11-07 LAB — URINALYSIS, COMPLETE
Bilirubin,UR: NEGATIVE
Ph: 7 (ref 4.5–8.0)
RBC,UR: 13 /HPF (ref 0–5)
Squamous Epithelial: <1
WBC UR: 232 /HPF (ref 0–5)

## 2011-11-07 LAB — HEMOGLOBIN A1C: Hemoglobin A1C: 9.5 % — ABNORMAL HIGH (ref 4.2–6.3)

## 2011-11-08 LAB — URINE CULTURE

## 2011-11-27 ENCOUNTER — Other Ambulatory Visit: Payer: Self-pay | Admitting: Nephrology

## 2011-11-27 LAB — URINALYSIS, COMPLETE
Bilirubin,UR: NEGATIVE
Blood: NEGATIVE
Glucose,UR: >=500 mg/dL (ref 0–75)
Ketone: NEGATIVE
Nitrite: NEGATIVE
Protein: NEGATIVE

## 2011-11-27 LAB — RENAL FUNCTION PANEL
Albumin: 3.3 g/dL — ABNORMAL LOW (ref 3.4–5.0)
Anion Gap: 6 — ABNORMAL LOW (ref 7–16)
BUN: 32 mg/dL — ABNORMAL HIGH (ref 7–18)
Calcium, Total: 8.7 mg/dL (ref 8.5–10.1)
Co2: 29 mmol/L (ref 21–32)
Creatinine: 2.68 mg/dL — ABNORMAL HIGH (ref 0.60–1.30)
EGFR (African American): 28 — ABNORMAL LOW
Glucose: 474 mg/dL — ABNORMAL HIGH (ref 65–99)
Potassium: 4.9 mmol/L (ref 3.5–5.1)

## 2011-11-27 LAB — HEMOGLOBIN A1C: Hemoglobin A1C: 10.2 % — ABNORMAL HIGH (ref 4.2–6.3)

## 2011-11-29 ENCOUNTER — Emergency Department: Payer: Self-pay | Admitting: Emergency Medicine

## 2011-11-29 LAB — COMPREHENSIVE METABOLIC PANEL
Alkaline Phosphatase: 119 U/L (ref 50–136)
Anion Gap: 15 (ref 7–16)
BUN: 30 mg/dL — ABNORMAL HIGH (ref 7–18)
Bilirubin,Total: 0.4 mg/dL (ref 0.2–1.0)
Calcium, Total: 9.1 mg/dL (ref 8.5–10.1)
Co2: 17 mmol/L — ABNORMAL LOW (ref 21–32)
Creatinine: 1.95 mg/dL — ABNORMAL HIGH (ref 0.60–1.30)
EGFR (African American): 41 — ABNORMAL LOW
EGFR (Non-African Amer.): 35 — ABNORMAL LOW
Glucose: 52 mg/dL — ABNORMAL LOW (ref 65–99)
Potassium: 3.9 mmol/L (ref 3.5–5.1)
SGPT (ALT): 32 U/L
Sodium: 136 mmol/L (ref 136–145)
Total Protein: 9.2 g/dL — ABNORMAL HIGH (ref 6.4–8.2)

## 2011-11-29 LAB — CBC
HGB: 10.7 g/dL — ABNORMAL LOW (ref 12.0–16.0)
MCH: 29.3 pg (ref 26.0–34.0)
MCHC: 31.8 g/dL — ABNORMAL LOW (ref 32.0–36.0)
MCV: 92 fL (ref 80–100)
Platelet: 231 10*3/uL (ref 150–440)
RBC: 3.67 10*6/uL — ABNORMAL LOW (ref 3.80–5.20)
WBC: 8.6 10*3/uL (ref 3.6–11.0)

## 2012-07-23 ENCOUNTER — Emergency Department: Payer: Self-pay | Admitting: Emergency Medicine

## 2012-07-23 LAB — BASIC METABOLIC PANEL
Anion Gap: 8 (ref 7–16)
Chloride: 101 mmol/L (ref 98–107)
Creatinine: 1.93 mg/dL — ABNORMAL HIGH (ref 0.60–1.30)
EGFR (African American): 41 — ABNORMAL LOW
EGFR (Non-African Amer.): 35 — ABNORMAL LOW
Potassium: 5.1 mmol/L (ref 3.5–5.1)

## 2012-08-14 ENCOUNTER — Inpatient Hospital Stay: Payer: Self-pay | Admitting: Internal Medicine

## 2012-08-14 LAB — COMPREHENSIVE METABOLIC PANEL
Alkaline Phosphatase: 115 U/L (ref 50–136)
BUN: 42 mg/dL — ABNORMAL HIGH (ref 7–18)
Calcium, Total: 9.3 mg/dL (ref 8.5–10.1)
Chloride: 103 mmol/L (ref 98–107)
Co2: 25 mmol/L (ref 21–32)
Creatinine: 2.73 mg/dL — ABNORMAL HIGH (ref 0.60–1.30)
EGFR (Non-African Amer.): 23 — ABNORMAL LOW
Osmolality: 304 (ref 275–301)
Potassium: 3.8 mmol/L (ref 3.5–5.1)
SGPT (ALT): 16 U/L (ref 12–78)
Total Protein: 9 g/dL — ABNORMAL HIGH (ref 6.4–8.2)

## 2012-08-14 LAB — CBC
MCHC: 32.7 g/dL (ref 32.0–36.0)
MCV: 92 fL (ref 80–100)
Platelet: 326 10*3/uL (ref 150–440)
RDW: 12.9 % (ref 11.5–14.5)
WBC: 11.3 10*3/uL — ABNORMAL HIGH (ref 3.6–11.0)

## 2012-08-14 LAB — URINALYSIS, COMPLETE
Bacteria: NONE SEEN
Bilirubin,UR: NEGATIVE
Glucose,UR: >=500 mg/dL (ref 0–75)
Leukocyte Esterase: NEGATIVE
Ph: 6 (ref 4.5–8.0)
RBC,UR: 4 /HPF (ref 0–5)
Specific Gravity: 1.023 (ref 1.003–1.030)
Squamous Epithelial: 1

## 2012-08-14 LAB — TSH: Thyroid Stimulating Horm: 1.53 u[IU]/mL

## 2012-08-14 LAB — TROPONIN I: Troponin-I: <0.02 ng/mL

## 2012-08-14 LAB — HEMOGLOBIN A1C: Hemoglobin A1C: 10.2 % — ABNORMAL HIGH (ref 4.2–6.3)

## 2012-08-14 LAB — PREGNANCY, URINE: Pregnancy Test, Urine: NEGATIVE m[IU]/mL

## 2012-08-15 LAB — COMPREHENSIVE METABOLIC PANEL
Albumin: 2.9 g/dL — ABNORMAL LOW (ref 3.4–5.0)
Alkaline Phosphatase: 75 U/L (ref 50–136)
BUN: 23 mg/dL — ABNORMAL HIGH (ref 7–18)
Co2: 24 mmol/L (ref 21–32)
Creatinine: 1.85 mg/dL — ABNORMAL HIGH (ref 0.60–1.30)
EGFR (African American): 43 — ABNORMAL LOW
Potassium: 3.7 mmol/L (ref 3.5–5.1)
SGOT(AST): 12 U/L — ABNORMAL LOW (ref 15–37)
Total Protein: 6.4 g/dL (ref 6.4–8.2)

## 2012-08-15 LAB — CBC WITH DIFFERENTIAL/PLATELET
Basophil %: 0.9 %
Eosinophil #: 0.2 10*3/uL (ref 0.0–0.7)
Eosinophil %: 3.1 %
Lymphocyte %: 30.1 %
MCH: 30.2 pg (ref 26.0–34.0)
MCV: 91 fL (ref 80–100)
Monocyte %: 3.4 %
Neutrophil #: 4.7 10*3/uL (ref 1.4–6.5)
Neutrophil %: 62.5 %
Platelet: 217 10*3/uL (ref 150–440)
RBC: 3.61 10*6/uL — ABNORMAL LOW (ref 3.80–5.20)

## 2012-08-16 LAB — PROTEIN / CREATININE RATIO, URINE
Creatinine, Urine: 27.3 mg/dL — ABNORMAL LOW (ref 30.0–125.0)
Protein, Random Urine: 7 mg/dL (ref 0–12)

## 2012-08-17 LAB — BASIC METABOLIC PANEL
Anion Gap: 6 — ABNORMAL LOW (ref 7–16)
BUN: 15 mg/dL (ref 7–18)
Calcium, Total: 8.3 mg/dL — ABNORMAL LOW (ref 8.5–10.1)
Creatinine: 1.71 mg/dL — ABNORMAL HIGH (ref 0.60–1.30)
EGFR (African American): 47 — ABNORMAL LOW
EGFR (Non-African Amer.): 41 — ABNORMAL LOW
Osmolality: 283 (ref 275–301)

## 2012-11-09 ENCOUNTER — Encounter (HOSPITAL_COMMUNITY): Payer: Self-pay | Admitting: Nurse Practitioner

## 2012-11-09 ENCOUNTER — Emergency Department (HOSPITAL_COMMUNITY)
Admission: EM | Admit: 2012-11-09 | Discharge: 2012-11-09 | Disposition: A | Discharge status: Home / Self Care | Payer: Medicare Other | Attending: Emergency Medicine | Admitting: Emergency Medicine

## 2012-11-09 DIAGNOSIS — Y939 Activity, unspecified: Secondary | ICD-10-CM | POA: Insufficient documentation

## 2012-11-09 DIAGNOSIS — Y929 Unspecified place or not applicable: Secondary | ICD-10-CM | POA: Insufficient documentation

## 2012-11-09 DIAGNOSIS — Z794 Long term (current) use of insulin: Secondary | ICD-10-CM | POA: Insufficient documentation

## 2012-11-09 DIAGNOSIS — L03119 Cellulitis of unspecified part of limb: Secondary | ICD-10-CM | POA: Insufficient documentation

## 2012-11-09 DIAGNOSIS — Z862 Personal history of diseases of the blood and blood-forming organs and certain disorders involving the immune mechanism: Secondary | ICD-10-CM | POA: Insufficient documentation

## 2012-11-09 DIAGNOSIS — L02419 Cutaneous abscess of limb, unspecified: Secondary | ICD-10-CM | POA: Insufficient documentation

## 2012-11-09 DIAGNOSIS — E039 Hypothyroidism, unspecified: Secondary | ICD-10-CM | POA: Insufficient documentation

## 2012-11-09 DIAGNOSIS — E119 Type 2 diabetes mellitus without complications: Secondary | ICD-10-CM | POA: Insufficient documentation

## 2012-11-09 DIAGNOSIS — X58XXXA Exposure to other specified factors, initial encounter: Secondary | ICD-10-CM | POA: Insufficient documentation

## 2012-11-09 DIAGNOSIS — L0291 Cutaneous abscess, unspecified: Secondary | ICD-10-CM

## 2012-11-09 LAB — CBC
MCH: 29.5 pg (ref 26.0–34.0)
MCV: 85 fL (ref 78.0–100.0)
Platelets: 325 10*3/uL (ref 150–400)
RBC: 3.86 MIL/uL — ABNORMAL LOW (ref 3.87–5.11)
RDW: 12.9 % (ref 11.5–15.5)
WBC: 10.2 10*3/uL (ref 4.0–10.5)

## 2012-11-09 LAB — BASIC METABOLIC PANEL
CO2: 26 mEq/L (ref 19–32)
Calcium: 9.7 mg/dL (ref 8.4–10.5)
Chloride: 99 mEq/L (ref 96–112)
Creatinine, Ser: 1.96 mg/dL — ABNORMAL HIGH (ref 0.50–1.10)
GFR calc Af Amer: 40 mL/min — ABNORMAL LOW (ref >90)
Sodium: 136 mEq/L (ref 135–145)

## 2012-11-09 LAB — GLUCOSE, CAPILLARY: Glucose-Capillary: 167 mg/dL — ABNORMAL HIGH (ref 70–99)

## 2012-11-09 MED ORDER — CLINDAMYCIN HCL 150 MG PO CAPS: 450.0000 mg | ORAL_CAPSULE | Freq: Three times a day (TID) | ORAL | Status: DC

## 2012-11-09 NOTE — ED Notes (Signed)
Pt reports wound to L medial ankle onset Wednesday, continues to get bigger and today began to have pain and redness at site. Pt reports history of poorly controlled diabetes, her blood sugar has been running in 200s.

## 2012-11-09 NOTE — ED Notes (Signed)
Pt reports finding a boil on in the interior aspect of left ankle on Monday. Reports it grew in size, and then yesterday it popped with pus and watery discharge. 2cm x 1 cm wound to interior aspect of ankle that is red with purulent area. Pt reports 5/10 pain when ambulating.     Discussed importance of BG control with pt and father. Pt tearful reporting that she checks her BG regularly and that that it has been getting higher within the last month. Pt has follow up appt with endocrinologist in July.

## 2012-11-09 NOTE — ED Notes (Signed)
PA at bedside.

## 2012-11-09 NOTE — ED Notes (Signed)
CBG was 167. Notified Nurse Ashleigh.

## 2012-11-09 NOTE — ED Notes (Signed)
Wound cleaned, bacitracin applied, site covered with 4x4. Pt tolerated well.

## 2012-11-09 NOTE — ED Provider Notes (Signed)
History     CSN: [id number]  Arrival date & time 11/09/12  1826   First MD Initiated Contact with Patient 11/09/12 1854      Chief Complaint  Patient presents with  . Wound Infection    (Consider location/radiation/quality/duration/timing/severity/associated sxs/prior treatment) HPI Patient presents to the emergency department with a wound to her left medial ankle.  Patient, states it started off as a blister area and then ruptured.  Patient, states, that he had some purulent drainage.  Patient denies fevers, nausea, vomiting, headache, blurred vision, weakness, dizziness, or syncope.  Patient, states, that she has been cleaning the wound with soap and water. Patient denies any pain in makes her condition worse other than walking. Past Medical History  Diagnosis Date  . Diabetes mellitus   . Hypothyroidism   . Anemia     Past Surgical History  Procedure Laterality Date  . Induced abortion      History reviewed. No pertinent family history.  History  Substance Use Topics  . Smoking status: Never Smoker   . Smokeless tobacco: Never Used  . Alcohol Use: No    OB History   Grav Para Term Preterm Abortions TAB SAB Ect Mult Living                  Review of Systems All other systems negative except as documented in the HPI. All pertinent positives and negatives as reviewed in the HPI. Allergies  Review of patient's allergies indicates no known allergies.  Home Medications   Current Outpatient Rx  Name  Route  Sig  Dispense  Refill  . insulin aspart (NOVOLOG) 100 UNIT/ML injection      Inject 0-15 units into the skin 3 times daily before meals per sliding scale.  CBG < 70: drink juice and or milk and call physician or 911    CBG 70 - 120: 0 units      CBG > 400 call MD and obtain STAT lab verification      CBG 121 - 150: 2 units      CBG 151 - 200: 3 units      CBG 201 - 250: 5 units      CBG 251 - 300: 8 units      CBG 301 - 350: 11 units      CBG 351 - 400:  15 units   1 vial   1   . insulin detemir (LEVEMIR) 100 unit/ml SOLN   Subcutaneous   Inject 8-10 Units into the skin 2 (two) times daily. 10 units in the am & 8 units in the pm           BP 104/78  Pulse 111  Temp(Src) 99.4 F (37.4 C) (Oral)  Resp 15  SpO2 99%  Physical Exam  Nursing note and vitals reviewed. Constitutional: She is oriented to person, place, and time. She appears well-developed and well-nourished. No distress.  HENT:  Head: Normocephalic and atraumatic.  Eyes: Pupils are equal, round, and reactive to light.  Cardiovascular: Normal rate, regular rhythm and normal heart sounds.   Pulmonary/Chest: Effort normal and breath sounds normal.  Musculoskeletal:       Feet:  Neurological: She is alert and oriented to person, place, and time.  Skin: Skin is warm and dry. No rash noted.    ED Course  Procedures (including critical care time)  Labs Reviewed  CBC - Abnormal; Notable for the following:    RBC 3.86 (*)  Hemoglobin 11.4 (*)    HCT 32.8 (*)    All other components within normal limits  BASIC METABOLIC PANEL - Abnormal; Notable for the following:    Glucose, Bld 172 (*)    BUN 36 (*)    Creatinine, Ser 1.96 (*)    GFR calc non Af Amer 34 (*)    GFR calc Af Amer 40 (*)    All other components within normal limits  GLUCOSE, CAPILLARY - Abnormal; Notable for the following:    Glucose-Capillary 167 (*)    All other components within normal limits   Patient is advised followup with her primary care Dr. For recheck.  Advised her to soak the area in warm water and I also advised her to return here for any worsening in her condition.  She is advised to keep the area clean and dry   MDM          Carlyle Dolly, PA-C 11/09/12 2144

## 2012-11-09 NOTE — ED Provider Notes (Signed)
Medical screening examination/treatment/procedure(s) were conducted as a shared visit with non-physician practitioner(s) and myself.  I personally evaluated the patient during the encounter  Donnetta Hutching, MD 11/09/12 [phone]

## 2013-01-20 ENCOUNTER — Ambulatory Visit: Payer: Medicare Other | Admitting: *Deleted

## 2013-02-16 ENCOUNTER — Emergency Department: Payer: Self-pay | Admitting: Emergency Medicine

## 2013-02-16 LAB — URINALYSIS, COMPLETE
Bacteria: NONE SEEN
Glucose,UR: >=500 mg/dL (ref 0–75)
Ketone: NEGATIVE
Leukocyte Esterase: NEGATIVE
Nitrite: NEGATIVE
Protein: 100
RBC,UR: 5 /HPF (ref 0–5)
Specific Gravity: 1.02 (ref 1.003–1.030)
Squamous Epithelial: 3

## 2013-02-16 LAB — COMPREHENSIVE METABOLIC PANEL
BUN: 38 mg/dL — ABNORMAL HIGH (ref 7–18)
Chloride: 105 mmol/L (ref 98–107)
Creatinine: 2.58 mg/dL — ABNORMAL HIGH (ref 0.60–1.30)
EGFR (African American): 29 — ABNORMAL LOW
EGFR (Non-African Amer.): 25 — ABNORMAL LOW
Glucose: 133 mg/dL — ABNORMAL HIGH (ref 65–99)
SGOT(AST): 19 U/L (ref 15–37)

## 2013-02-16 LAB — CBC
HGB: 12.8 g/dL (ref 12.0–16.0)
MCHC: 33.8 g/dL (ref 32.0–36.0)
Platelet: 302 10*3/uL (ref 150–440)
RBC: 4.43 10*6/uL (ref 3.80–5.20)
RDW: 14.2 % (ref 11.5–14.5)

## 2013-03-16 ENCOUNTER — Encounter (HOSPITAL_COMMUNITY): Payer: Self-pay | Admitting: Emergency Medicine

## 2013-03-16 ENCOUNTER — Emergency Department (HOSPITAL_COMMUNITY)
Admission: EM | Admit: 2013-03-16 | Discharge: 2013-03-16 | Disposition: A | Discharge status: Home / Self Care | Payer: Medicare Other | Attending: Emergency Medicine | Admitting: Emergency Medicine

## 2013-03-16 DIAGNOSIS — Z862 Personal history of diseases of the blood and blood-forming organs and certain disorders involving the immune mechanism: Secondary | ICD-10-CM | POA: Insufficient documentation

## 2013-03-16 DIAGNOSIS — Z8639 Personal history of other endocrine, nutritional and metabolic disease: Secondary | ICD-10-CM | POA: Insufficient documentation

## 2013-03-16 DIAGNOSIS — Z3202 Encounter for pregnancy test, result negative: Secondary | ICD-10-CM | POA: Insufficient documentation

## 2013-03-16 DIAGNOSIS — R109 Unspecified abdominal pain: Secondary | ICD-10-CM | POA: Insufficient documentation

## 2013-03-16 DIAGNOSIS — R739 Hyperglycemia, unspecified: Secondary | ICD-10-CM

## 2013-03-16 DIAGNOSIS — Z792 Long term (current) use of antibiotics: Secondary | ICD-10-CM | POA: Insufficient documentation

## 2013-03-16 DIAGNOSIS — Z794 Long term (current) use of insulin: Secondary | ICD-10-CM | POA: Insufficient documentation

## 2013-03-16 DIAGNOSIS — R1110 Vomiting, unspecified: Secondary | ICD-10-CM | POA: Insufficient documentation

## 2013-03-16 DIAGNOSIS — E119 Type 2 diabetes mellitus without complications: Secondary | ICD-10-CM | POA: Insufficient documentation

## 2013-03-16 DIAGNOSIS — R7309 Other abnormal glucose: Secondary | ICD-10-CM | POA: Insufficient documentation

## 2013-03-16 DIAGNOSIS — E860 Dehydration: Secondary | ICD-10-CM | POA: Insufficient documentation

## 2013-03-16 LAB — POCT I-STAT, CHEM 8
Calcium, Ion: 1.18 mmol/L (ref 1.12–1.23)
Creatinine, Ser: 2.4 mg/dL — ABNORMAL HIGH (ref 0.50–1.10)
Glucose, Bld: 670 mg/dL (ref 70–99)
HCT: 41 % (ref 36.0–46.0)
Hemoglobin: 13.9 g/dL (ref 12.0–15.0)
Potassium: 4.9 mEq/L (ref 3.5–5.1)
Sodium: 140 mEq/L (ref 135–145)
TCO2: 19 mmol/L (ref 0–100)

## 2013-03-16 LAB — COMPREHENSIVE METABOLIC PANEL
ALT: 15 U/L (ref 0–35)
AST: 17 U/L (ref 0–37)
Albumin: 3.7 g/dL (ref 3.5–5.2)
Alkaline Phosphatase: 131 U/L — ABNORMAL HIGH (ref 39–117)
CO2: 17 mEq/L — ABNORMAL LOW (ref 19–32)
Chloride: 98 mEq/L (ref 96–112)
GFR calc non Af Amer: 28 mL/min — ABNORMAL LOW (ref >90)
Potassium: 4.7 mEq/L (ref 3.5–5.1)
Sodium: 137 mEq/L (ref 135–145)
Total Bilirubin: 0.6 mg/dL (ref 0.3–1.2)

## 2013-03-16 LAB — URINALYSIS, ROUTINE W REFLEX MICROSCOPIC
Glucose, UA: >1000 mg/dL — AB
Ketones, ur: 15 mg/dL — AB
Leukocytes, UA: NEGATIVE
pH: 5.5 (ref 5.0–8.0)

## 2013-03-16 LAB — CBC WITH DIFFERENTIAL/PLATELET
Basophils Absolute: 0 10*3/uL (ref 0.0–0.1)
Basophils Relative: 0 % (ref 0–1)
HCT: 38.2 % (ref 36.0–46.0)
Lymphocytes Relative: 17 % (ref 12–46)
MCHC: 34.6 g/dL (ref 30.0–36.0)
Neutro Abs: 8.1 10*3/uL — ABNORMAL HIGH (ref 1.7–7.7)
Neutrophils Relative %: 80 % — ABNORMAL HIGH (ref 43–77)
RDW: 15.2 % (ref 11.5–15.5)
WBC: 10.2 10*3/uL (ref 4.0–10.5)

## 2013-03-16 LAB — POCT I-STAT 3, VENOUS BLOOD GAS (G3P V)
Acid-base deficit: 3 mmol/L — ABNORMAL HIGH (ref 0.0–2.0)
Bicarbonate: 20.3 mEq/L (ref 20.0–24.0)
O2 Saturation: 98 %
TCO2: 21 mmol/L (ref 0–100)
pCO2, Ven: 30.2 mmHg — ABNORMAL LOW (ref 45.0–50.0)
pO2, Ven: 101 mmHg — ABNORMAL HIGH (ref 30.0–45.0)

## 2013-03-16 LAB — URINE MICROSCOPIC-ADD ON

## 2013-03-16 LAB — POCT PREGNANCY, URINE: Preg Test, Ur: NEGATIVE

## 2013-03-16 LAB — GLUCOSE, CAPILLARY
Glucose-Capillary: 284 mg/dL — ABNORMAL HIGH (ref 70–99)
Glucose-Capillary: 290 mg/dL — ABNORMAL HIGH (ref 70–99)

## 2013-03-16 MED ORDER — SODIUM CHLORIDE 0.9 % IV SOLN
1000.0000 mL | Freq: Once | INTRAVENOUS | Status: AC
  Administered 2013-03-16: 1000 mL via INTRAVENOUS

## 2013-03-16 MED ORDER — SODIUM CHLORIDE 0.9 % IV SOLN
INTRAVENOUS | Status: DC
  Filled 2013-03-16: qty 1

## 2013-03-16 MED ORDER — MORPHINE SULFATE 4 MG/ML IJ SOLN
4.0000 mg | Freq: Once | INTRAMUSCULAR | Status: AC
  Administered 2013-03-16: 4 mg via INTRAVENOUS
  Filled 2013-03-16: qty 1

## 2013-03-16 MED ORDER — INSULIN ASPART 100 UNIT/ML ~~LOC~~ SOLN
10.0000 [IU] | Freq: Once | SUBCUTANEOUS | Status: AC
  Administered 2013-03-16: 10 [IU] via SUBCUTANEOUS
  Filled 2013-03-16: qty 1

## 2013-03-16 MED ORDER — SODIUM CHLORIDE 0.9 % IV BOLUS (SEPSIS)
1000.0000 mL | Freq: Once | INTRAVENOUS | Status: AC
  Administered 2013-03-16: 1000 mL via INTRAVENOUS

## 2013-03-16 MED ORDER — ONDANSETRON HCL 4 MG/2ML IJ SOLN
4.0000 mg | Freq: Once | INTRAMUSCULAR | Status: AC
  Administered 2013-03-16: 4 mg via INTRAVENOUS
  Filled 2013-03-16: qty 2

## 2013-03-16 MED ORDER — SODIUM CHLORIDE 0.9 % IV SOLN: INTRAVENOUS | Status: DC

## 2013-03-16 NOTE — ED Notes (Signed)
Patient states she started having nausea and vomiting last Friday.  Patient states she took her sugar yesterday and it was 200.  Patient states she hasn't taken it today.

## 2013-03-16 NOTE — ED Provider Notes (Signed)
TIME SEEN: 9:13 AM  CHIEF COMPLAINT: Abdominal pain, vomiting, hyperglycemia  HPI: Patient is a 27 y.o. female with a history of insulin-dependent diabetes, hypothyroidism who presents emergency department with moderate, sharp abdominal pain, vomiting since Friday, 3 days ago and hyperglycemia today. She states that her glucometer read "high". Patient reports that she has been taking her insulin as scheduled. She takes Levemir 10 units in the morning and 8 units at night and then NovoLog sliding scale insulin. She states she's had DKA several times before and this feels similar to her prior episodes. She states that she has had multiple episodes of vomiting but no diarrhea. No dysuria or hematuria. Last menstrual period was September 1. No vaginal bleeding or discharge. She denies any aggravating or alleviating factors to her abdominal pain. No radiation.  ROS: See HPI Constitutional: no fever  Eyes: no drainage  ENT: no runny nose   Cardiovascular:  no chest pain  Resp: no SOB  GI: vomiting GU: no dysuria Integumentary: no rash  Allergy: no hives  Musculoskeletal: no leg swelling  Neurological: no slurred speech ROS otherwise negative  PAST MEDICAL HISTORY/PAST SURGICAL HISTORY:  Past Medical History  Diagnosis Date  . Diabetes mellitus   . Hypothyroidism   . Anemia     MEDICATIONS:  Prior to Admission medications   Medication Sig Start Date End Date Taking? Authorizing Provider  clindamycin (CLEOCIN) 150 MG capsule Take 3 capsules (450 mg total) by mouth 3 (three) times daily. 11/09/12   Jamesetta Orleans Lawyer, PA-C  insulin aspart (NOVOLOG) 100 UNIT/ML injection Inject 0-15 units into the skin 3 times daily before meals per sliding scale.  CBG < 70: drink juice and or milk and call physician or 911    CBG 70 - 120: 0 units      CBG > 400 call MD and obtain STAT lab verification      CBG 121 - 150: 2 units      CBG 151 - 200: 3 units      CBG 201 - 250: 5 units      CBG 251 -  300: 8 units      CBG 301 - 350: 11 units      CBG 351 - 400: 15 units 10/23/11   Penny Pia, MD  insulin detemir (LEVEMIR) 100 unit/ml SOLN Inject 8-10 Units into the skin 2 (two) times daily. 10 units in the am & 8 units in the pm    Historical Provider, MD    ALLERGIES:  No Known Allergies  SOCIAL HISTORY:  History  Substance Use Topics  . Smoking status: Never Smoker   . Smokeless tobacco: Never Used  . Alcohol Use: No    FAMILY HISTORY: No family history on file.  EXAM: BP 154/105  Pulse 98  Resp 18  Ht 5\' 2"  (1.575 m)  Wt 130 lb (58.968 kg)  BMI 23.77 kg/m2  SpO2 97%  LMP 02/16/2013 CONSTITUTIONAL: Alert and oriented and responds appropriately to questions. Well-appearing; well-nourished HEAD: Normocephalic EYES: Conjunctivae clear, PERRL ENT: normal nose; no rhinorrhea; dry mucous membranes; pharynx without lesions noted NECK: Supple, no meningismus, no LAD  CARD: Tachycardic; S1 and S2 appreciated; no murmurs, no clicks, no rubs, no gallops RESP: Normal chest excursion without splinting or tachypnea; breath sounds clear and equal bilaterally; no wheezes, no rhonchi, no rales,  ABD/GI: Normal bowel sounds; non-distended; soft, non-tender, no rebound, no guarding BACK:  The back appears normal and is non-tender to palpation, there is  no CVA tenderness EXT: Normal ROM in all joints; non-tender to palpation; no edema; normal capillary refill; no cyanosis    SKIN: Normal color for age and race; warm NEURO: Moves all extremities equally PSYCH: The patient's mood and manner are appropriate. Grooming and personal hygiene are appropriate.  MEDICAL DECISION MAKING: Patient with abdominal pain, tachycardia, vomiting or feels similar to her prior episodes of DKA. Will obtain labs, give IV fluids, Zofran, morphine and reassess. Patient may need insulin drip and admission.  ED PROGRESS: Patient's blood glucose is 670. Her pH is 7.45 and her anion gap is 10. Potassium is 4.9.  Will continue IV fluids and bolus insulin. We'll attempt to control her blood sugar in the ED.    Date: 03/16/2013 10:01 AM  Rate: 115  Rhythm: Sinus tachycardia  QRS Axis: normal  Intervals: normal  ST/T Wave abnormalities: normal  Conduction Disutrbances: none  Narrative Interpretation: Sinus tachycardia, no ischemic changes, no arrhythmia    12:34 PM  Pt glucose is now 284. Will by mouth challenge. She still mildly tachycardic. Will give second liter of IV fluids.  2:51 PM  Pt heart rate is now in the upper 90s on my examination. Her blood glucoses under 300. She's been able to tolerate by mouth without difficulty.  Patient reports she has plenty of insulin home as primary care followup. Given strict return precautions. Instructed her to drink plenty of fluids and avoid starches, sweets. Patient verbalizes understanding is comfortable this plan.  Layla Maw Khalia Gong, DO 03/16/13 1453

## 2013-03-16 NOTE — ED Notes (Signed)
Chem 8 results called to MD, Ward

## 2013-03-16 NOTE — ED Notes (Addendum)
IV start unsuccessful x 2.  IV team called.  MD aware.

## 2013-03-26 IMAGING — CT CT HEAD WITHOUT CONTRAST
3 of 4 series · 17 of 30 positions shown, 20 images · non-contrast
Comparison: none

REASON FOR EXAM: ams
COMMENTS:

PROCEDURE:     CT  - CT HEAD WITHOUT CONTRAST  - October 25, 2011  [DATE]
RESULT:     History: Altered mental status.
Comparison Study: Prior CT of 01/23/2009.

[Series 2: without · axial · non-contrast · 0.44mm/px · z∈[-125,-5]mm · 9 of 30 slices shown, 12 images (1 of 2)]
[im 3/30  brain]
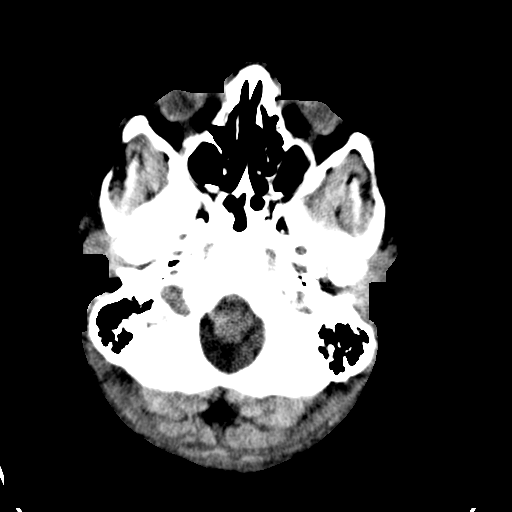
[im 3/30  bone]
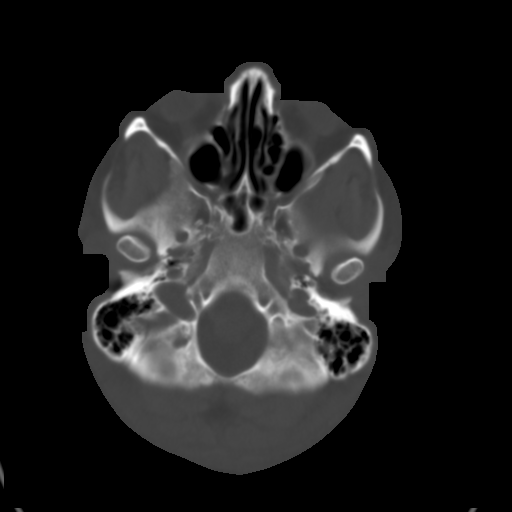
[im 6/30  brain]
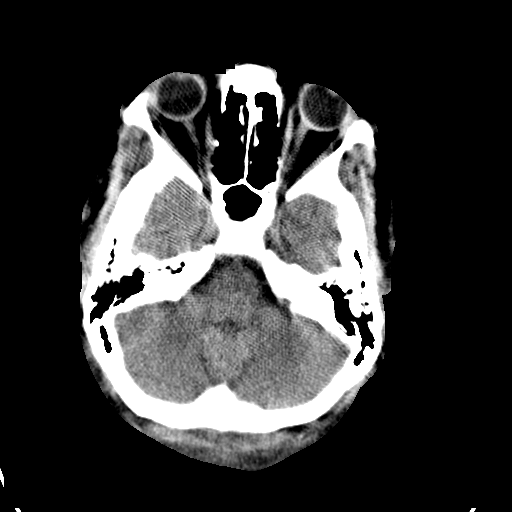
[im 9/30  brain]
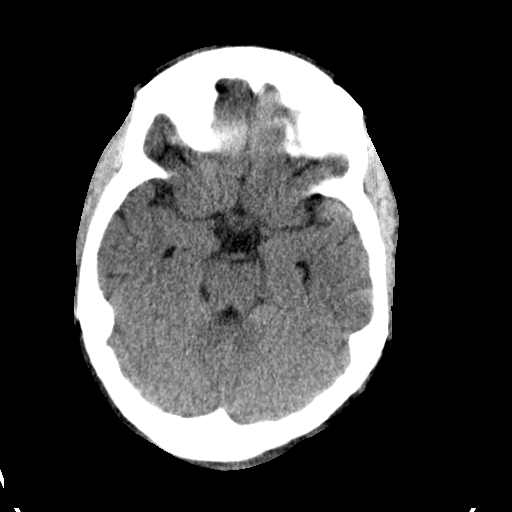
[im 12/30  brain]
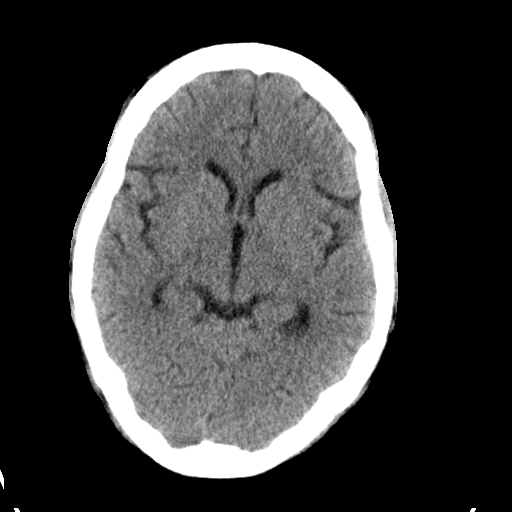
[im 15/30  brain]
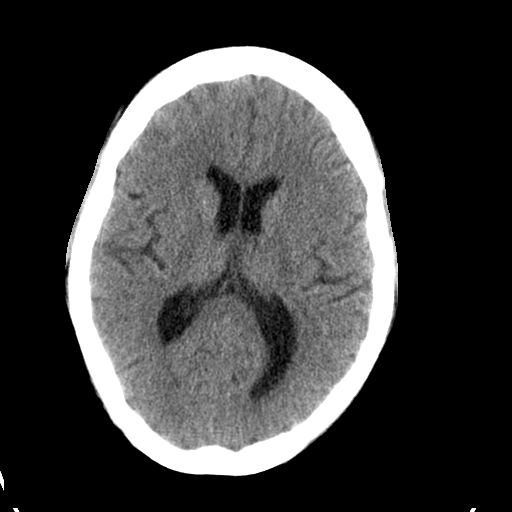
[im 15/30  bone]
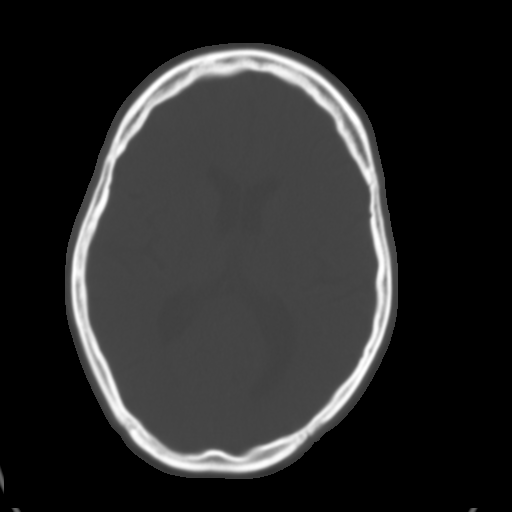
[im 18/30  brain]
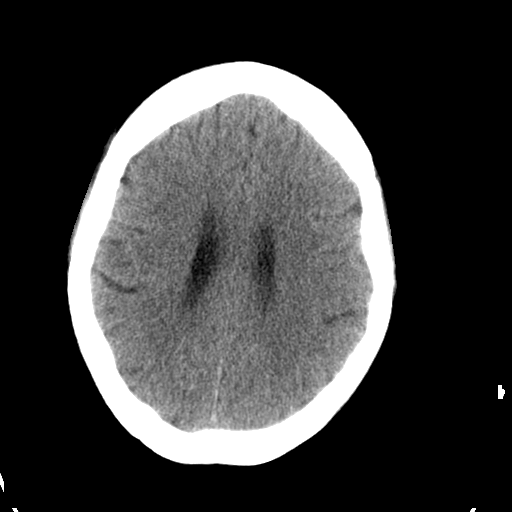
[im 21/30  brain]
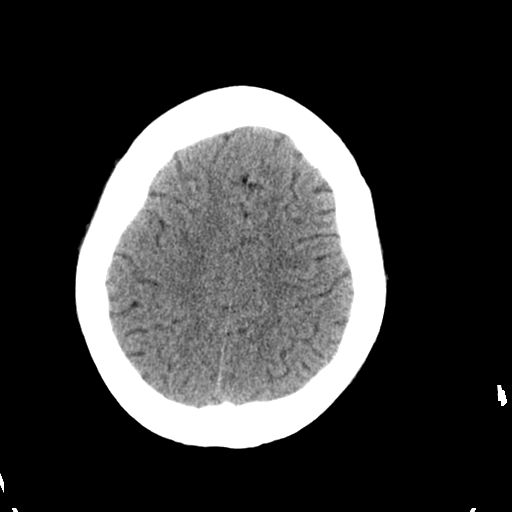
[im 24/30  brain]
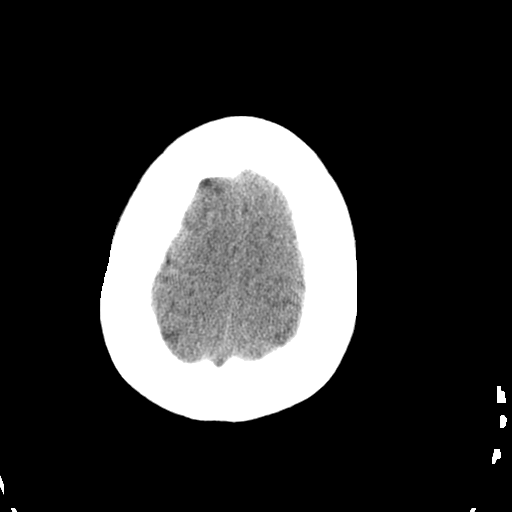
[im 27/30  brain]
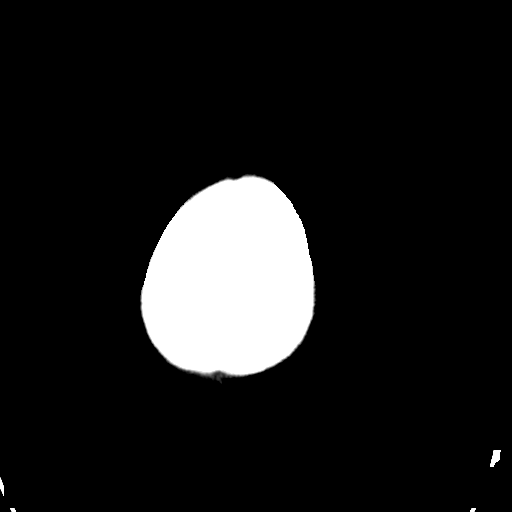
[im 27/30  bone]
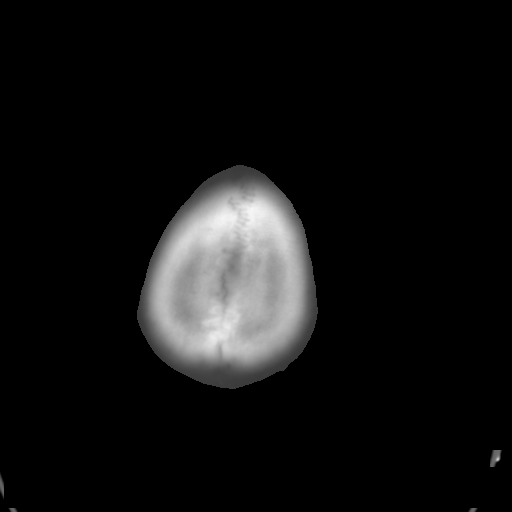

[Series 3: bone · axial · 0.44mm/px · z∈[-125,-35]mm · 6 of 30 slices shown]
[im 3/30  bone]
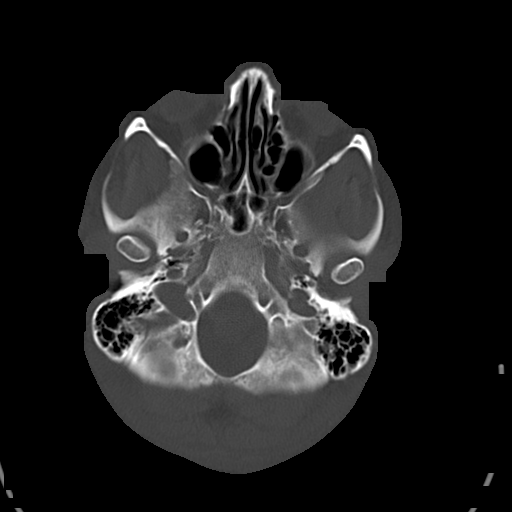
[im 6/30  bone]
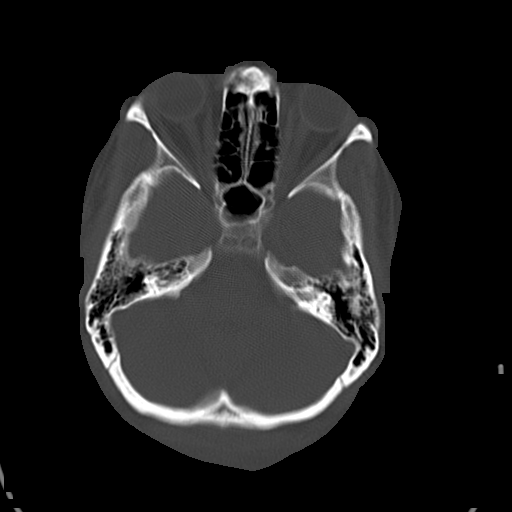
[im 9/30  bone]
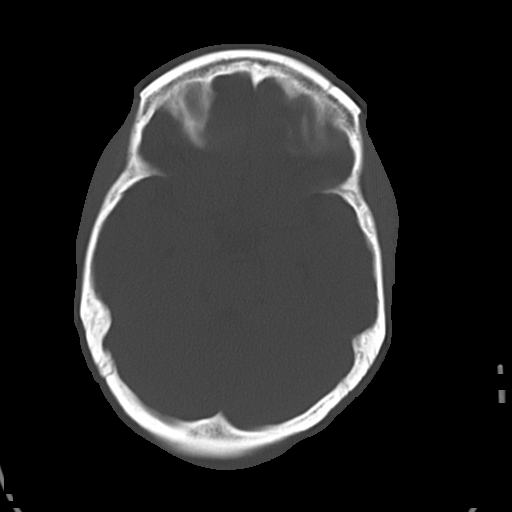
[im 12/30  bone]
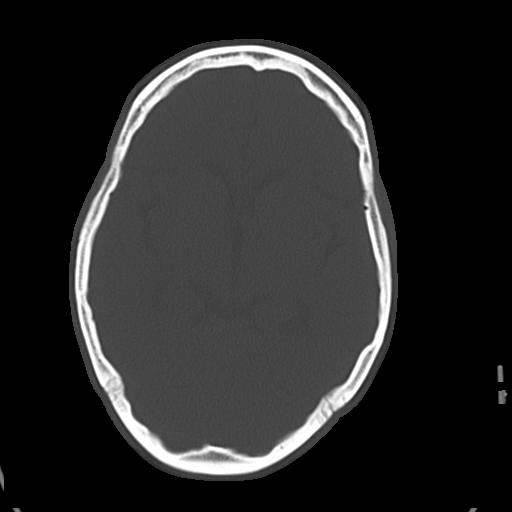
[im 18/30  bone]
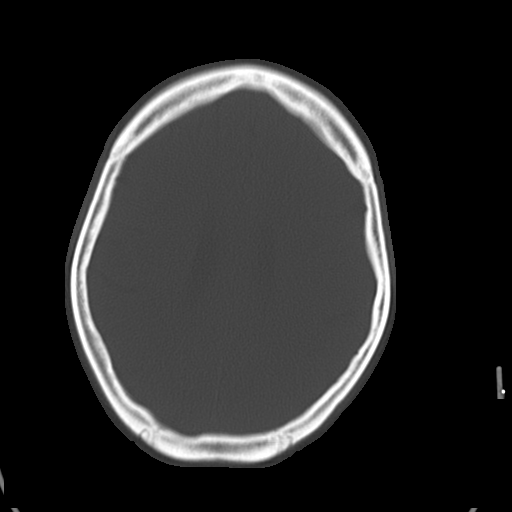
[im 21/30  bone]
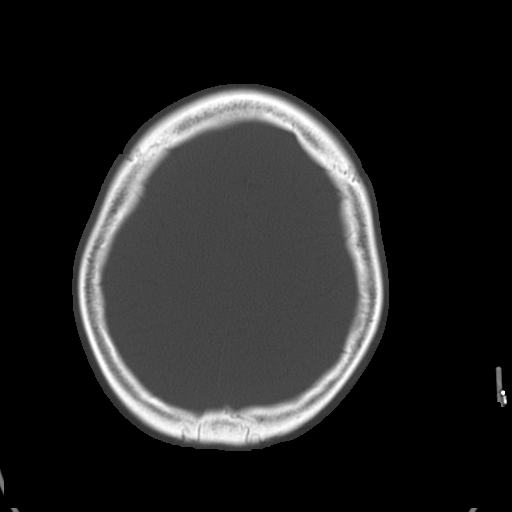

[Series 4: without · axial · non-contrast · 0.44mm/px · z∈[-120,-105]mm · 2 of 11 slices shown (2 of 2)]
[im 4/11  brain]
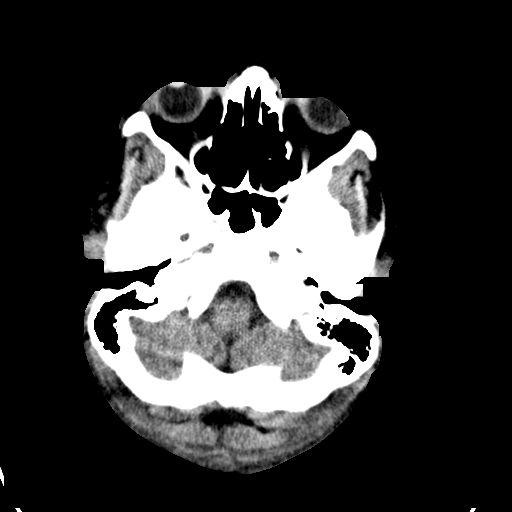
[im 7/11  brain]
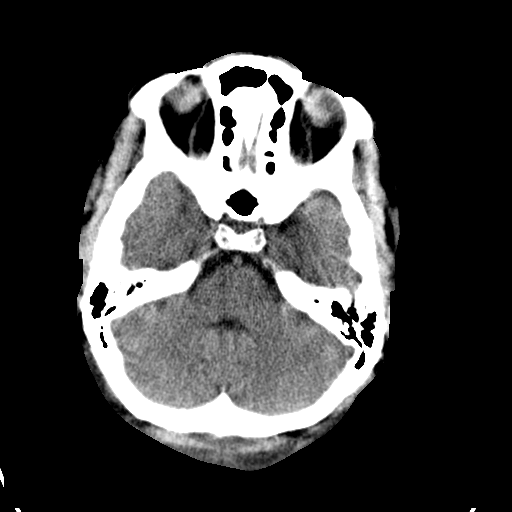

[17 of 30 positions shown; findings below may reference images not displayed]

FINDINGS: No mass. No hydrocephalus. No hemorrhage. No acute bony
abnormality.
IMPRESSION: No acute abnormality.

## 2013-03-27 IMAGING — US US RENAL KIDNEY
1 series · 17 of 25 positions shown · non-contrast
Comparison: none

REASON FOR EXAM: acute renal failure
COMMENTS:

PROCEDURE:     US  - US KIDNEY  - October 26, 2011 [DATE]
RESULT:     Comparison: None.
TECHNIQUE: Multiple grayscale and color Doppler images were obtained of the
kidneys.

[Series 1: us renal kidney · 17 of 36 slices shown]
[im 1/36]
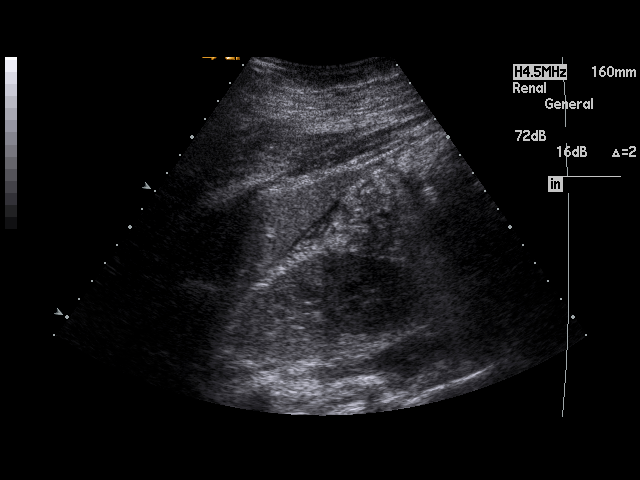
[im 3/36]
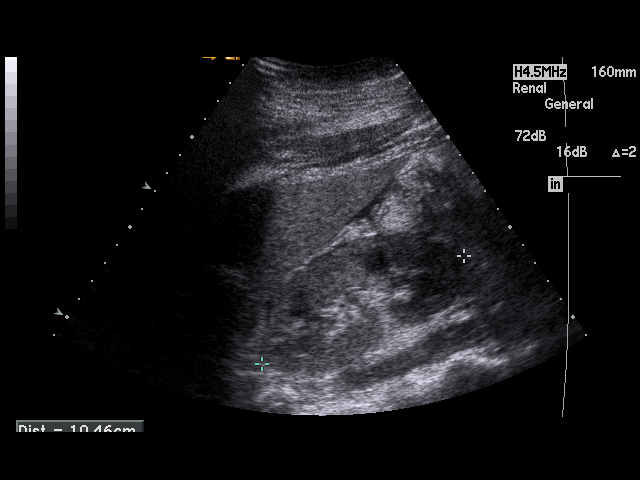
[im 5/36]
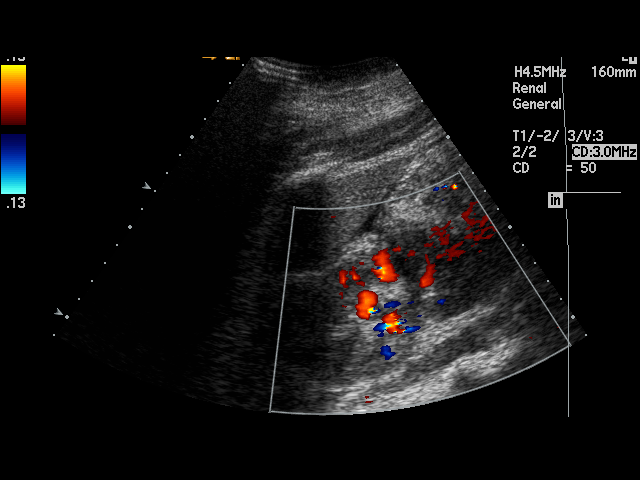
[im 8/36]
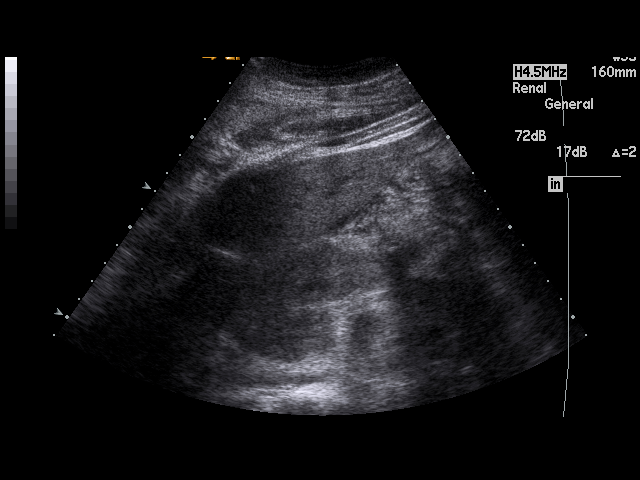
[im 9/36]
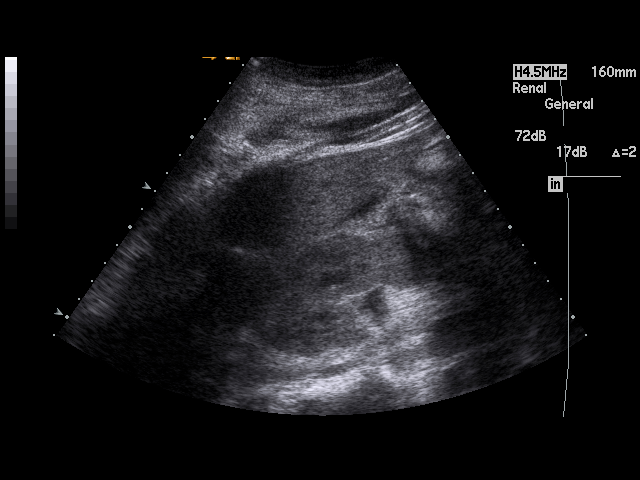
[im 12/36]
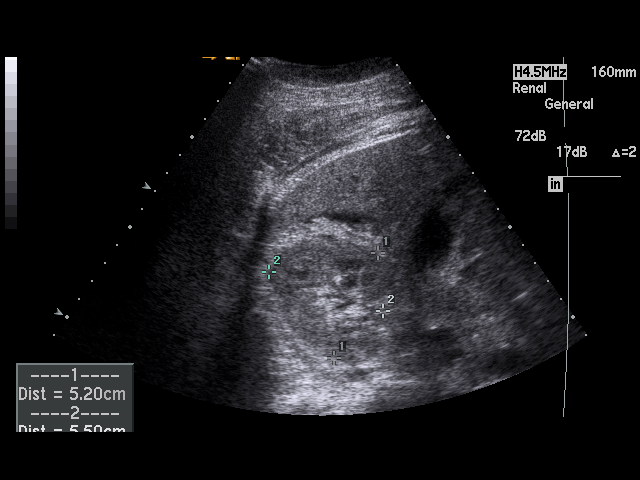
[im 14/36]
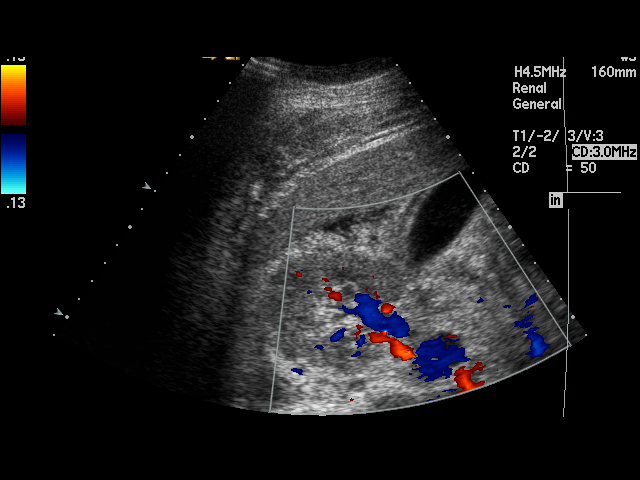
[im 17/36]
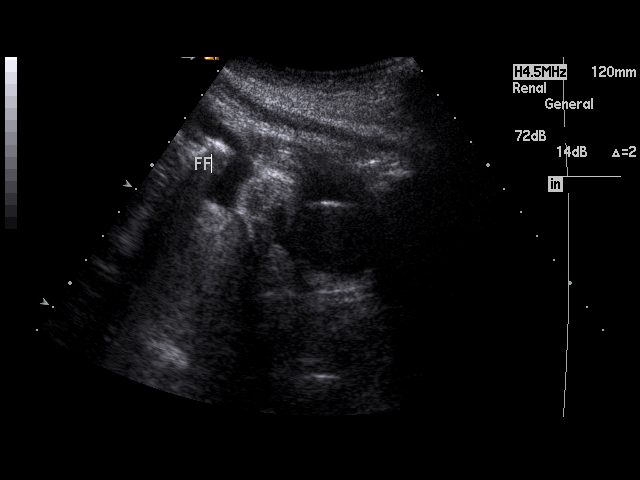
[im 18/36]
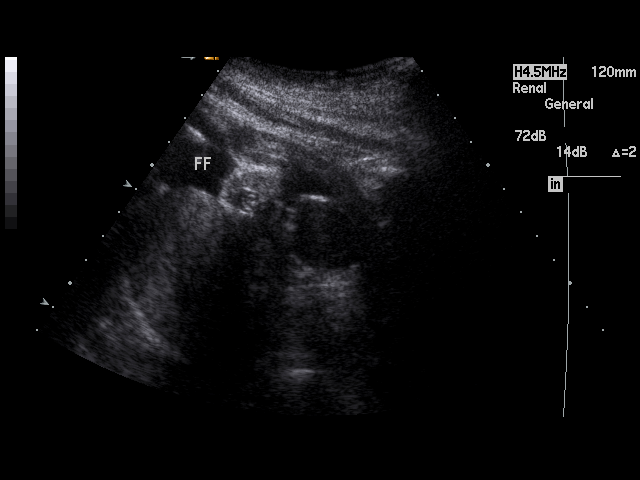
[im 19/36]
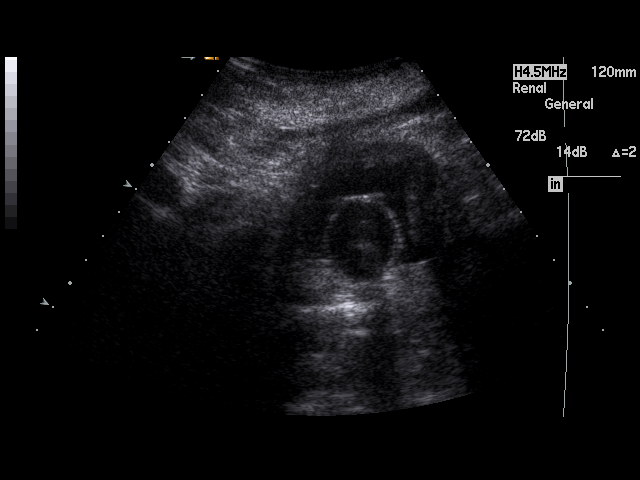
[im 22/36]
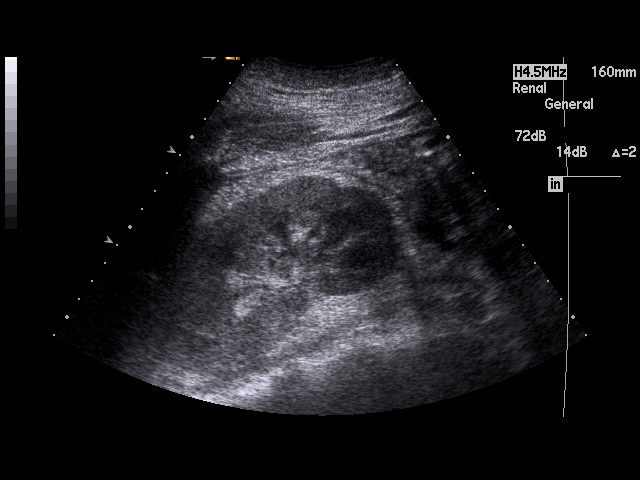
[im 24/36]
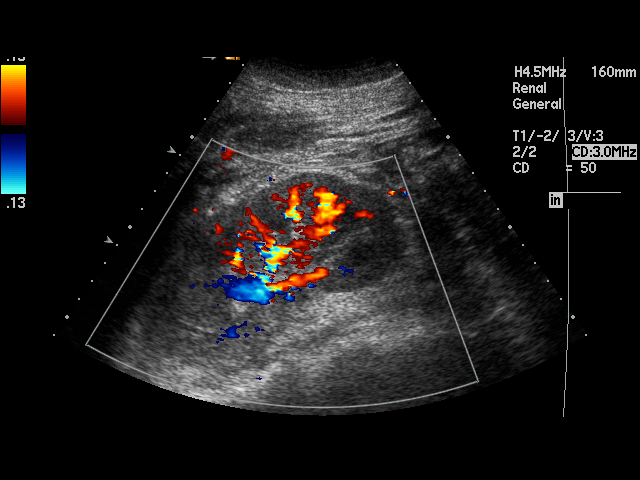
[im 27/36]
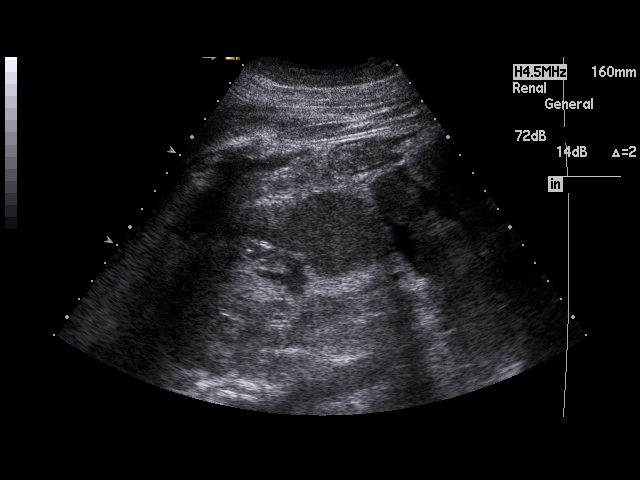
[im 28/36]
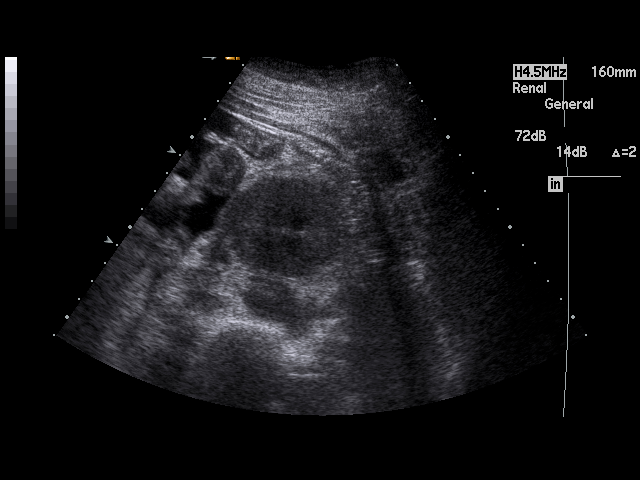
[im 31/36]
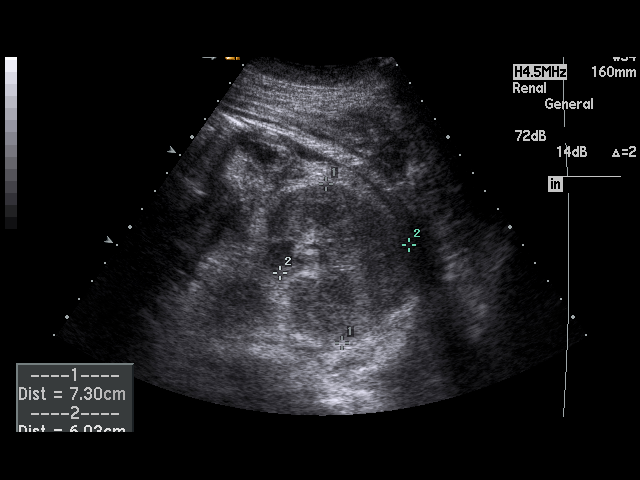
[im 33/36]
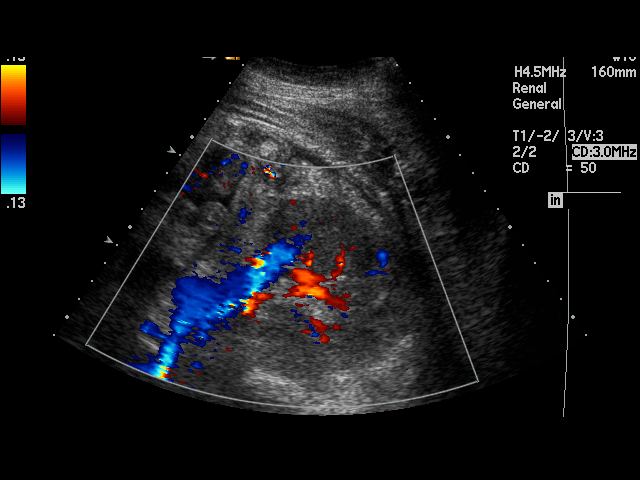
[im 36/36]
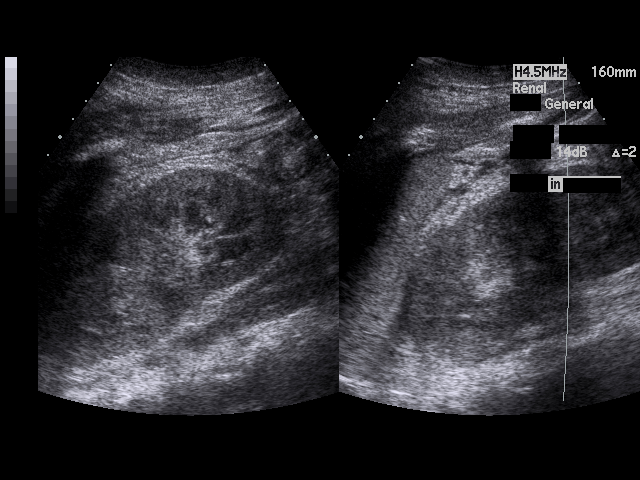

[17 of 25 positions shown; findings below may reference images not displayed]

FINDINGS: The right kidney measures 10.5 x 5.2 x 5.5 cm. The left kidney measures
x 7.3 x 6.0 cm. There is no hydronephrosis. The kidneys appear subtly
increased in echogenicity, as can be seen with medical renal disease. There
is a trace amount of fluid adjacent to the kidneys as well is incomplete
visualized about the liver. A small amount of fluid is present in the
pelvis. The bladder is decompressed with a Foley catheter.
IMPRESSION: 1. No hydronephrosis.
2. Small amount of ascites.

## 2013-03-28 IMAGING — CR DG CHEST 1V PORT
1 series · 1 of 1 positions shown · non-contrast
Comparison: none

REASON FOR EXAM: ON VENT F/U ON PULM EDEMA
COMMENTS:

[portable]
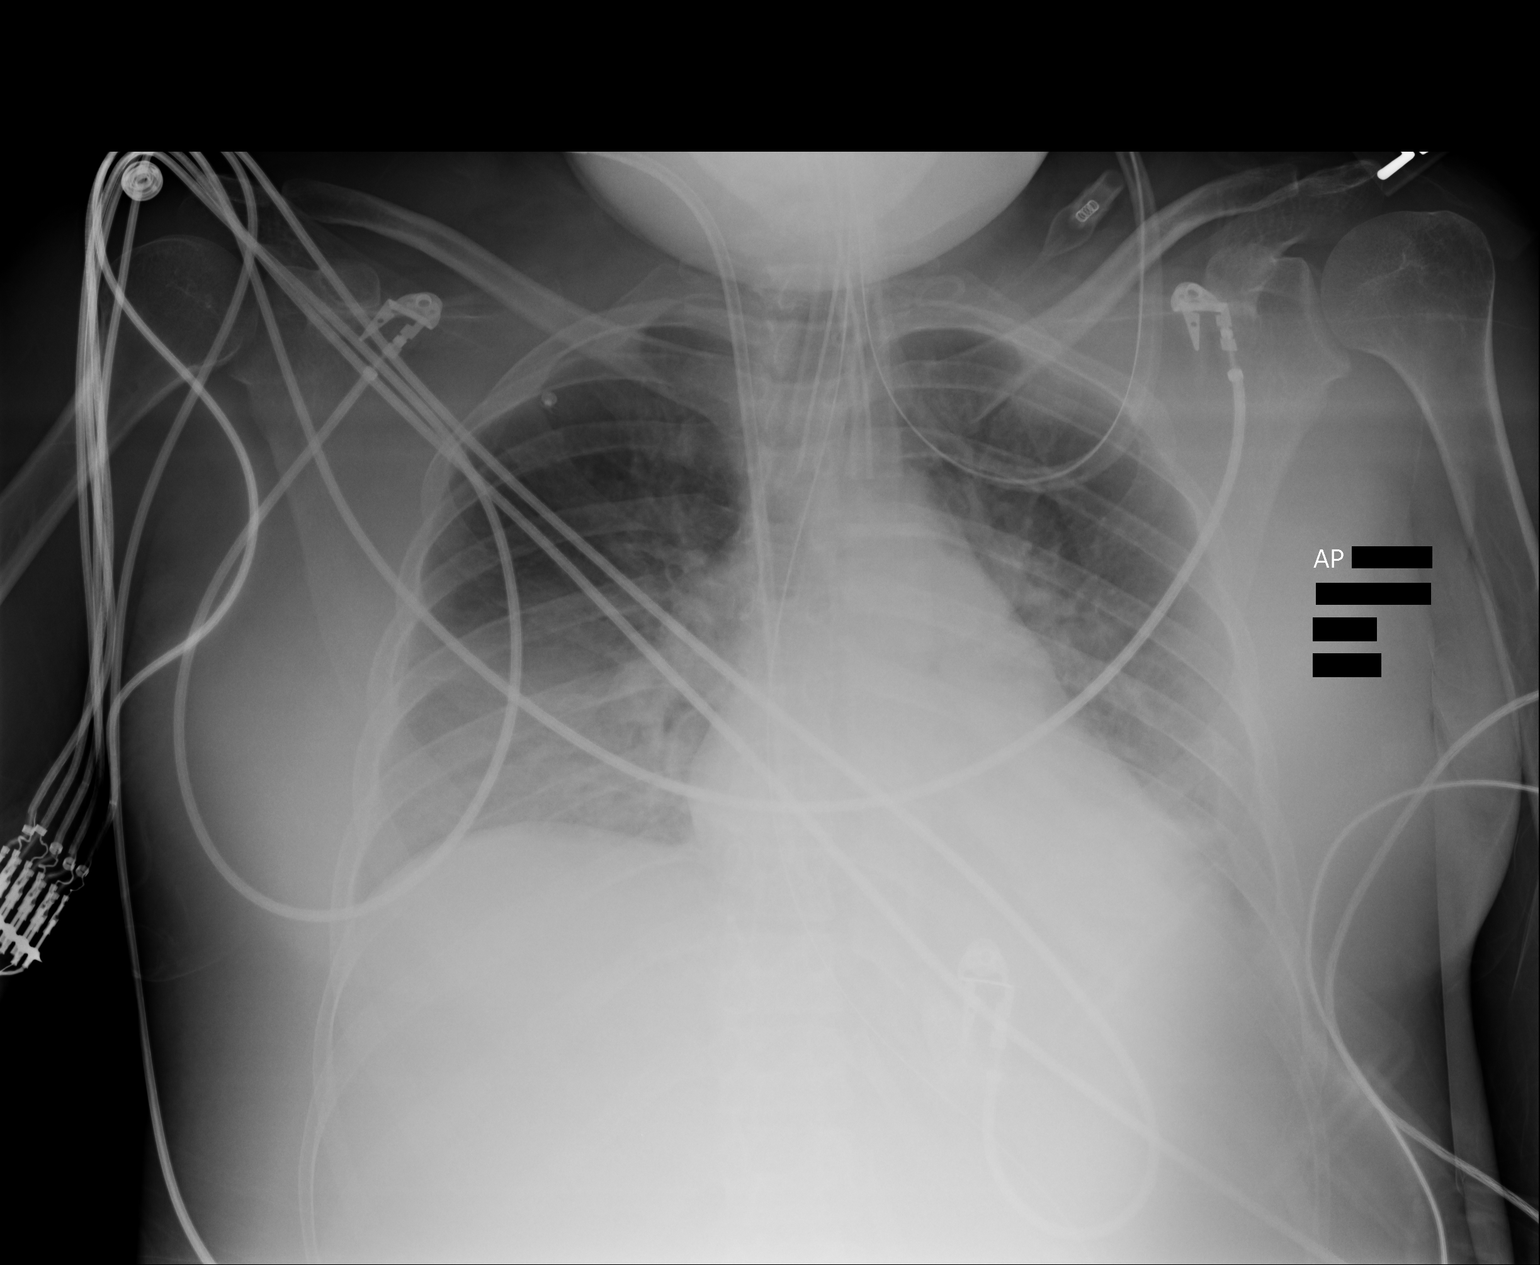

[1 of 1 positions shown; findings below may reference images not displayed]

PROCEDURE:     DXR - DXR PORTABLE CHEST SINGLE VIEW  - October 27, 2011 [DATE]

RESULT:     Comparison is made to the previous exam dated 26 October, 2011.

There is a right internal jugular central venous catheter present. The tip
is in the right atrium. Endotracheal tube projects to the upper margin of
the aortic arch level. Nasogastric tube passes below the diaphragm. The
heart is normal in size. There is improving aeration of the lungs with
decreasing edema. Small left pleural effusion appears present. Monitoring
electrodes are present.
IMPRESSION: Decreasing interstitial edema. Some left lung base
atelectasis and minimal left pleural effusion suspected.

[REDACTED]

## 2013-04-10 ENCOUNTER — Encounter (HOSPITAL_COMMUNITY): Payer: Self-pay | Admitting: Emergency Medicine

## 2013-04-10 ENCOUNTER — Observation Stay (HOSPITAL_COMMUNITY)
Admission: EM | Admit: 2013-04-10 | Discharge: 2013-04-11 | Disposition: A | Discharge status: Home / Self Care | Payer: Medicare Other | Attending: Internal Medicine | Admitting: Internal Medicine

## 2013-04-10 DIAGNOSIS — E872 Acidosis, unspecified: Secondary | ICD-10-CM | POA: Diagnosis present

## 2013-04-10 DIAGNOSIS — E1049 Type 1 diabetes mellitus with other diabetic neurological complication: Secondary | ICD-10-CM | POA: Insufficient documentation

## 2013-04-10 DIAGNOSIS — E869 Volume depletion, unspecified: Secondary | ICD-10-CM

## 2013-04-10 DIAGNOSIS — R739 Hyperglycemia, unspecified: Secondary | ICD-10-CM

## 2013-04-10 DIAGNOSIS — E039 Hypothyroidism, unspecified: Secondary | ICD-10-CM

## 2013-04-10 DIAGNOSIS — Z79899 Other long term (current) drug therapy: Secondary | ICD-10-CM | POA: Insufficient documentation

## 2013-04-10 DIAGNOSIS — B9689 Other specified bacterial agents as the cause of diseases classified elsewhere: Secondary | ICD-10-CM

## 2013-04-10 DIAGNOSIS — K3184 Gastroparesis: Secondary | ICD-10-CM | POA: Insufficient documentation

## 2013-04-10 DIAGNOSIS — N19 Unspecified kidney failure: Secondary | ICD-10-CM

## 2013-04-10 DIAGNOSIS — E1149 Type 2 diabetes mellitus with other diabetic neurological complication: Secondary | ICD-10-CM

## 2013-04-10 DIAGNOSIS — E1010 Type 1 diabetes mellitus with ketoacidosis without coma: Principal | ICD-10-CM | POA: Diagnosis present

## 2013-04-10 DIAGNOSIS — E1143 Type 2 diabetes mellitus with diabetic autonomic (poly)neuropathy: Secondary | ICD-10-CM | POA: Diagnosis present

## 2013-04-10 DIAGNOSIS — R7309 Other abnormal glucose: Secondary | ICD-10-CM

## 2013-04-10 DIAGNOSIS — E1065 Type 1 diabetes mellitus with hyperglycemia: Secondary | ICD-10-CM | POA: Insufficient documentation

## 2013-04-10 DIAGNOSIS — R110 Nausea: Secondary | ICD-10-CM

## 2013-04-10 DIAGNOSIS — N179 Acute kidney failure, unspecified: Secondary | ICD-10-CM | POA: Insufficient documentation

## 2013-04-10 DIAGNOSIS — R112 Nausea with vomiting, unspecified: Secondary | ICD-10-CM | POA: Diagnosis present

## 2013-04-10 DIAGNOSIS — R7881 Bacteremia: Secondary | ICD-10-CM

## 2013-04-10 DIAGNOSIS — N390 Urinary tract infection, site not specified: Secondary | ICD-10-CM

## 2013-04-10 LAB — BASIC METABOLIC PANEL
BUN: 25 mg/dL — ABNORMAL HIGH (ref 6–23)
BUN: 22 mg/dL (ref 6–23)
CO2: 15 mEq/L — ABNORMAL LOW (ref 19–32)
Chloride: 92 mEq/L — ABNORMAL LOW (ref 96–112)
Chloride: 107 mEq/L (ref 96–112)
GFR calc Af Amer: 47 mL/min — ABNORMAL LOW (ref >90)
GFR calc Af Amer: 48 mL/min — ABNORMAL LOW (ref >90)
GFR calc non Af Amer: 40 mL/min — ABNORMAL LOW (ref >90)
Potassium: 4 mEq/L (ref 3.5–5.1)
Potassium: 3.9 mEq/L (ref 3.5–5.1)
Sodium: 136 mEq/L (ref 135–145)
Sodium: 139 mEq/L (ref 135–145)

## 2013-04-10 LAB — CBC WITH DIFFERENTIAL/PLATELET
Basophils Absolute: 0 10*3/uL (ref 0.0–0.1)
Eosinophils Absolute: 0.1 10*3/uL (ref 0.0–0.7)
Lymphs Abs: 2.6 10*3/uL (ref 0.7–4.0)
MCH: 30.4 pg (ref 26.0–34.0)
Neutrophils Relative %: 68 % (ref 43–77)
Platelets: 315 10*3/uL (ref 150–400)
RBC: 5.04 MIL/uL (ref 3.87–5.11)
RDW: 14.3 % (ref 11.5–15.5)
WBC: 9.7 10*3/uL (ref 4.0–10.5)

## 2013-04-10 LAB — URINALYSIS, ROUTINE W REFLEX MICROSCOPIC
Glucose, UA: 250 mg/dL — AB
Ketones, ur: >80 mg/dL — AB
Leukocytes, UA: NEGATIVE
Protein, ur: 30 mg/dL — AB
Urobilinogen, UA: 0.2 mg/dL (ref 0.0–1.0)

## 2013-04-10 LAB — GLUCOSE, CAPILLARY
Glucose-Capillary: 214 mg/dL — ABNORMAL HIGH (ref 70–99)
Glucose-Capillary: 42 mg/dL — CL (ref 70–99)
Glucose-Capillary: 156 mg/dL — ABNORMAL HIGH (ref 70–99)

## 2013-04-10 LAB — POCT I-STAT, CHEM 8
Calcium, Ion: 1.18 mmol/L (ref 1.12–1.23)
Creatinine, Ser: 2 mg/dL — ABNORMAL HIGH (ref 0.50–1.10)
HCT: 50 % — ABNORMAL HIGH (ref 36.0–46.0)
Hemoglobin: 17 g/dL — ABNORMAL HIGH (ref 12.0–15.0)
Potassium: 4 mEq/L (ref 3.5–5.1)
Sodium: 138 mEq/L (ref 135–145)
TCO2: 14 mmol/L (ref 0–100)

## 2013-04-10 LAB — POCT I-STAT 3, VENOUS BLOOD GAS (G3P V)
Acid-base deficit: 12 mmol/L — ABNORMAL HIGH (ref 0.0–2.0)
Bicarbonate: 13.7 mEq/L — ABNORMAL LOW (ref 20.0–24.0)
pH, Ven: 7.253 (ref 7.250–7.300)
pO2, Ven: 51 mmHg — ABNORMAL HIGH (ref 30.0–45.0)

## 2013-04-10 LAB — HEPATIC FUNCTION PANEL
AST: 14 U/L (ref 0–37)
Albumin: 3.1 g/dL — ABNORMAL LOW (ref 3.5–5.2)
Alkaline Phosphatase: 80 U/L (ref 39–117)
Indirect Bilirubin: 0.3 mg/dL (ref 0.3–0.9)
Total Protein: 6.4 g/dL (ref 6.0–8.3)

## 2013-04-10 LAB — URINE MICROSCOPIC-ADD ON

## 2013-04-10 LAB — LIPASE, BLOOD: Lipase: 11 U/L (ref 11–59)

## 2013-04-10 LAB — POCT I-STAT TROPONIN I

## 2013-04-10 LAB — PREGNANCY, URINE: Preg Test, Ur: NEGATIVE

## 2013-04-10 MED ORDER — METOCLOPRAMIDE HCL 5 MG/5ML PO SOLN
5.0000 mg | Freq: Three times a day (TID) | ORAL | Status: DC
  Administered 2013-04-10 – 2013-04-11 (×3): 5 mg via ORAL
  Filled 2013-04-10 (×8): qty 5

## 2013-04-10 MED ORDER — ACETAMINOPHEN 500 MG PO TABS
1000.0000 mg | ORAL_TABLET | Freq: Once | ORAL | Status: DC
  Filled 2013-04-10: qty 2

## 2013-04-10 MED ORDER — SODIUM CHLORIDE 0.9 % IV BOLUS (SEPSIS)
1000.0000 mL | Freq: Once | INTRAVENOUS | Status: AC
  Administered 2013-04-10: 1000 mL via INTRAVENOUS

## 2013-04-10 MED ORDER — DEXTROSE 5 % IV SOLN
Freq: Once | INTRAVENOUS | Status: AC
  Administered 2013-04-10: 22:00:00 via INTRAVENOUS

## 2013-04-10 MED ORDER — TRAMADOL HCL 50 MG PO TABS
50.0000 mg | ORAL_TABLET | Freq: Once | ORAL | Status: AC
  Administered 2013-04-10: 50 mg via ORAL
  Filled 2013-04-10: qty 1

## 2013-04-10 NOTE — ED Notes (Signed)
Pt has a urine cup and knows we need a urine sample 

## 2013-04-10 NOTE — ED Provider Notes (Signed)
[name] Ross S 8:00 PM patient discussed in signout. Patient with history of type 1 diabetes presenting with concerns for her elevated blood sugar, abdominal cramping and general fatigue.  Blood sugar not significantly elevated. Patient did have slightly low CO2 on BMP with anion gap of 23.  Patient also with low bicarbonate and pH of 7.25 on blood gas. Findings consistent with DKA. Currently plan is to IV fluids and recheck BMP. If CO2 improved may consider to discharge patient home.  8:30 PM patient resting appears comfortable but states she continues to feel poorly. She has received her second liter of fluids. I will reorder her BMP. Patient denies any Tylenol, aspirin or NSAID use recently. No recent alcohol or drug use. She does report having poor appetite and fluid intake for the past week. SHe has been using her insulin regularly.  Repeat BMP shows slightly improved CO2 however there is concern for DKA still and patient warrants admission. Sugar has also dropped and I will start D5 maintenance infusion. Patient discussed with Dr. Manus Gunning who agrees with this plan.  Spoke with [facility] hospitalist. They will see patient and admit.  Angus Seller, PA-C 04/11/13 [phone]

## 2013-04-10 NOTE — ED Notes (Signed)
IV team paged. 2 RNs looked for IV and were unable to obtain access.

## 2013-04-10 NOTE — ED Provider Notes (Signed)
CSN: [id number]     Arrival date & time 04/10/13  1154 History   First MD Initiated Contact with Patient 04/10/13 1255     Chief Complaint  Patient presents with  . Hyperglycemia   (Consider location/radiation/quality/duration/timing/severity/associated sxs/prior Treatment) HPI Comments: PT with a hx on IDDM  Presents with elevated blood sugars. She states her machine has been reading in the 500 range. She's had a history of  DKA in the past and says this feels similar. She had nausea and vomiting earlier in the week but the last time was 3 days ago. She denies any fevers or other recent illnesses. She states that she has been compliant with her insulin. She takes Levemir 10 units in the morning and 8 units at night as well as sliding scale insulin.reading in the 500 range.   Patient is a 27 y.o. female presenting with hyperglycemia.  Hyperglycemia Associated symptoms: abdominal pain, fatigue, nausea and vomiting   Associated symptoms: no chest pain, no diaphoresis, no dizziness, no fever and no shortness of breath     Past Medical History  Diagnosis Date  . Diabetes mellitus   . Hypothyroidism   . Anemia    Past Surgical History  Procedure Laterality Date  . Induced abortion     History reviewed. No pertinent family history. History  Substance Use Topics  . Smoking status: Never Smoker   . Smokeless tobacco: Never Used  . Alcohol Use: No   OB History   Grav Para Term Preterm Abortions TAB SAB Ect Mult Living                 Review of Systems  Constitutional: Positive for fatigue. Negative for fever, chills and diaphoresis.  HENT: Negative for congestion, rhinorrhea and sneezing.   Eyes: Negative.   Respiratory: Negative for cough, chest tightness and shortness of breath.   Cardiovascular: Negative for chest pain and leg swelling.  Gastrointestinal: Positive for nausea, vomiting and abdominal pain. Negative for diarrhea and blood in stool.  Genitourinary: Negative for  frequency, hematuria, flank pain and difficulty urinating.  Musculoskeletal: Negative for arthralgias and back pain.  Skin: Negative for rash.  Neurological: Negative for dizziness, speech difficulty, weakness, numbness and headaches.    Allergies  Review of patient's allergies indicates no known allergies.  Home Medications   Current Outpatient Rx  Name  Route  Sig  Dispense  Refill  . insulin aspart (NOVOLOG) 100 UNIT/ML injection      Inject 0-15 units into the skin 3 times daily before meals per sliding scale.  CBG < 70: drink juice and or milk and call physician or 911    CBG 70 - 120: 0 units      CBG > 400 call MD and obtain STAT lab verification      CBG 121 - 150: 2 units      CBG 151 - 200: 3 units      CBG 201 - 250: 5 units      CBG 251 - 300: 8 units      CBG 301 - 350: 11 units      CBG 351 - 400: 15 units   1 vial   1   . insulin detemir (LEVEMIR) 100 unit/ml SOLN   Subcutaneous   Inject 8-10 Units into the skin 2 (two) times daily. 10 units in the am & 8 units in the pm          BP 124/79  Pulse 110  Temp(Src) 97.7 F (36.5 C) (Oral)  Resp 20  SpO2 100%  LMP 02/16/2013 Physical Exam  Constitutional: She is oriented to person, place, and time. She appears well-developed and well-nourished.  Smells of ketones  HENT:  Head: Normocephalic and atraumatic.  Mouth/Throat: Oropharynx is clear and moist.  Eyes: Pupils are equal, round, and reactive to light.  Neck: Normal range of motion. Neck supple.  Cardiovascular: Normal rate, regular rhythm and normal heart sounds.   Pulmonary/Chest: Effort normal and breath sounds normal. No respiratory distress. She has no wheezes. She has no rales. She exhibits no tenderness.  Abdominal: Soft. Bowel sounds are normal. There is no tenderness. There is no rebound and no guarding.  Musculoskeletal: Normal range of motion. She exhibits no edema.  Lymphadenopathy:    She has no cervical adenopathy.  Neurological: She  is alert and oriented to person, place, and time.  Skin: Skin is warm and dry. No rash noted.  Psychiatric: She has a normal mood and affect.    ED Course  Procedures (including critical care time) Labs Review Results for orders placed during the hospital encounter of 04/10/13  GLUCOSE, CAPILLARY      Result Value Range   Glucose-Capillary 214 (*) 70 - 99 mg/dL   Comment 1 Documented in Chart     Comment 2 Notify RN    CBC WITH DIFFERENTIAL      Result Value Range   WBC 9.7  4.0 - 10.5 K/uL   RBC 5.04  3.87 - 5.11 MIL/uL   Hemoglobin 15.3 (*) 12.0 - 15.0 g/dL   HCT 40.9  81.1 - 91.4 %   MCV 85.9  78.0 - 100.0 fL   MCH 30.4  26.0 - 34.0 pg   MCHC 35.3  30.0 - 36.0 g/dL   RDW 78.2  95.6 - 21.3 %   Platelets 315  150 - 400 K/uL   Neutrophils Relative % 68  43 - 77 %   Neutro Abs 6.6  1.7 - 7.7 K/uL   Lymphocytes Relative 27  12 - 46 %   Lymphs Abs 2.6  0.7 - 4.0 K/uL   Monocytes Relative 4  3 - 12 %   Monocytes Absolute 0.4  0.1 - 1.0 K/uL   Eosinophils Relative 1  0 - 5 %   Eosinophils Absolute 0.1  0.0 - 0.7 K/uL   Basophils Relative 0  0 - 1 %   Basophils Absolute 0.0  0.0 - 0.1 K/uL  URINALYSIS, ROUTINE W REFLEX MICROSCOPIC      Result Value Range   Color, Urine YELLOW  YELLOW   APPearance CLOUDY (*) CLEAR   Specific Gravity, Urine >1.030 (*) 1.005 - 1.030   pH 5.5  5.0 - 8.0   Glucose, UA 250 (*) NEGATIVE mg/dL   Hgb urine dipstick LARGE (*) NEGATIVE   Bilirubin Urine MODERATE (*) NEGATIVE   Ketones, ur >80 (*) NEGATIVE mg/dL   Protein, ur 30 (*) NEGATIVE mg/dL   Urobilinogen, UA 0.2  0.0 - 1.0 mg/dL   Nitrite NEGATIVE  NEGATIVE   Leukocytes, UA NEGATIVE  NEGATIVE  PREGNANCY, URINE      Result Value Range   Preg Test, Ur NEGATIVE  NEGATIVE  KETONES, QUALITATIVE      Result Value Range   Acetone, Bld SMALL (*) NEGATIVE  URINE MICROSCOPIC-ADD ON      Result Value Range   Squamous Epithelial / LPF RARE  RARE   WBC, UA 0-2  <3 WBC/hpf  RBC / HPF 0-2  <3  RBC/hpf   Bacteria, UA RARE  RARE   Urine-Other RARE YEAST    BASIC METABOLIC PANEL      Result Value Range   Sodium 136  135 - 145 mEq/L   Potassium 4.0  3.5 - 5.1 mEq/L   Chloride 92 (*) 96 - 112 mEq/L   CO2 11 (*) 19 - 32 mEq/L   Glucose, Bld 109 (*) 70 - 99 mg/dL   BUN 25 (*) 6 - 23 mg/dL   Creatinine, Ser 2.84 (*) 0.50 - 1.10 mg/dL   Calcium 9.7  8.4 - 13.2 mg/dL   GFR calc non Af Amer 40 (*) >90 mL/min   GFR calc Af Amer 47 (*) >90 mL/min  POCT I-STAT, CHEM 8      Result Value Range   Sodium 138  135 - 145 mEq/L   Potassium 4.0  3.5 - 5.1 mEq/L   Chloride 103  96 - 112 mEq/L   BUN 28 (*) 6 - 23 mg/dL   Creatinine, Ser 4.40 (*) 0.50 - 1.10 mg/dL   Glucose, Bld 102 (*) 70 - 99 mg/dL   Calcium, Ion 7.25  3.66 - 1.23 mmol/L   TCO2 14  0 - 100 mmol/L   Hemoglobin 17.0 (*) 12.0 - 15.0 g/dL   HCT 44.0 (*) 34.7 - 42.5 %  POCT I-STAT 3, BLOOD GAS (G3P V)      Result Value Range   pH, Ven 7.253  7.250 - 7.300   pCO2, Ven 31.0 (*) 45.0 - 50.0 mmHg   pO2, Ven 51.0 (*) 30.0 - 45.0 mmHg   Bicarbonate 13.7 (*) 20.0 - 24.0 mEq/L   TCO2 15  0 - 100 mmol/L   O2 Saturation 81.0     Acid-base deficit 12.0 (*) 0.0 - 2.0 mmol/L   Sample type VENOUS     No results found.   Imaging Review No results found.  EKG Interpretation   None       MDM   1. Hyperglycemia   2. Acidosis    Patient presents with hyperglycemia. She's been given IV fluids her sugars have come down nicely to 109. She still mildly tachycardic but her heart rate is improving. She's had no vomiting. She has no signs of sepsis or infection. She is still acidotic with a bicarbonate of 11. She otherwise is well. However so I will go ahead and give her a second liter of fluids and recheck her bicarbonate following this. It is improving I feel that she appropriate for discharge.    Rolan Bucco, MD 04/10/13 [phone]

## 2013-04-10 NOTE — H&P (Signed)
Chief Complaint:  N/v abd pain  HPI: 27 yo female h/o type 1 dm since ago of 9, dx of gastroparesis in 2010, hypothyroidism comes in with chronic nausea for over 3 months with occasional vomiting.  A lot of epigastric abd pain.  Worse with eating.  Over the last several days she has more nausea and vomiting than usual.  No fevers.  No eating well at all.  Her diabetes is poorly controlled, mainly because she does not check her glucose as she should.  She was on reglan years ago, but none since around 2011.  Does not recall if it helped.  She did not take her insulin today b/c she has not been eating.  She came to ED because she was feeling so awful and couldn't hold anything down.  Her gluc initially was around 200, was given liter ivf bolus and her glucose dropped down to 40.  No insulin was given.  Now on d5.    Review of Systems:  Positive and negative as per HPI otherwise all other systems are negative  Past Medical History: Past Medical History  Diagnosis Date  . Diabetes mellitus   . Hypothyroidism   . Anemia    Past Surgical History  Procedure Laterality Date  . Induced abortion      Medications: Prior to Admission medications   Medication Sig Start Date End Date Taking? Authorizing Provider  insulin aspart (NOVOLOG) 100 UNIT/ML injection Inject 0-15 units into the skin 3 times daily before meals per sliding scale.  CBG < 70: drink juice and or milk and call physician or 911    CBG 70 - 120: 0 units      CBG > 400 call MD and obtain STAT lab verification      CBG 121 - 150: 2 units      CBG 151 - 200: 3 units      CBG 201 - 250: 5 units      CBG 251 - 300: 8 units      CBG 301 - 350: 11 units      CBG 351 - 400: 15 units 10/23/11  Yes Penny Pia, MD  insulin detemir (LEVEMIR) 100 unit/ml SOLN Inject 8-10 Units into the skin 2 (two) times daily. 10 units in the am & 8 units in the pm   Yes Historical Provider, MD    Allergies:  No Known Allergies  Social History:  reports that she has never smoked. She has never used smokeless tobacco. She reports that she does not drink alcohol or use illicit drugs.  Family History: History reviewed. No pertinent family history.  Physical Exam: Filed Vitals:   04/10/13 2015 04/10/13 2030 04/10/13 2045 04/10/13 2100  BP: 116/76 115/75 124/76 113/74  Pulse: 100 101 103 99  Temp:      TempSrc:      Resp:      SpO2: 100% 100% 97% 100%   General appearance: alert, cooperative and no distress Head: Normocephalic, without obvious abnormality, atraumatic Eyes: negative Nose: Nares normal. Septum midline. Mucosa normal. No drainage or sinus tenderness. Neck: no JVD and supple, symmetrical, trachea midline Lungs: clear to auscultation bilaterally Heart: regular rate and rhythm, S1, S2 normal, no murmur, click, rub or gallop Abdomen: soft, non-tender; bowel sounds normal; no masses,  no organomegaly Extremities: extremities normal, atraumatic, no cyanosis or edema Pulses: 2+ and symmetric Skin: Skin color, texture, turgor normal. No rashes or lesions Neurologic: Grossly normal  Labs on Admission:  Recent Labs  04/10/13 1707 04/10/13 2030  NA 136 139  K 4.0 3.9  CL 92* 107  CO2 11* 15*  GLUCOSE 109* 55*  BUN 25* 22  CREATININE 1.72* 1.67*  CALCIUM 9.7 8.1*    Recent Labs  04/10/13 2030  AST 14  ALT 12  ALKPHOS 80  BILITOT 0.4  PROT 6.4  ALBUMIN 3.1*    Recent Labs  04/10/13 2030  LIPASE 11    Recent Labs  04/10/13 1533 04/10/13 1604  WBC 9.7  --   NEUTROABS 6.6  --   HGB 15.3* 17.0*  HCT 43.3 50.0*  MCV 85.9  --   PLT 315  --    Radiological Exams on Admission: No results found.  Assessment/Plan 27 yo female with volume depletion, probable uncontrolled gastroparesis, mild dka  Principal Problem:   DKA, type 1  Place on ivf overnight, freq ck bmp.  freq glucose cks, brittle.    Active Problems:   Metabolic acidosis   Diabetic gastroparesis place on reglan.     Chronic  nausea   Nausea and vomiting    [place] Chelf A 04/10/2013, 10:56 PM

## 2013-04-10 NOTE — Progress Notes (Signed)
RN called to get report- phone line was busy. Will try again

## 2013-04-10 NOTE — ED Notes (Addendum)
Pt reports to the ED for eval of hyperglycemia. Pt reports that no one has told her that she had to keep her insulin in the fridge. Pt reports that she has been compliant with her medication but it is not working. Pt reports, "not feeling good," for 4 or 5 days. Pt A&O x4. Skin warm and dry and VSS en route. Pt rocking back and forth and refusing to answer questions. Pt reports generalized abdominal cramping x several days. Pt denies any N/V/D.

## 2013-04-11 ENCOUNTER — Encounter (HOSPITAL_COMMUNITY): Payer: Self-pay | Admitting: General Practice

## 2013-04-11 LAB — BASIC METABOLIC PANEL
BUN: 18 mg/dL (ref 6–23)
BUN: 15 mg/dL (ref 6–23)
CO2: 17 mEq/L — ABNORMAL LOW (ref 19–32)
CO2: 17 mEq/L — ABNORMAL LOW (ref 19–32)
Calcium: 7.8 mg/dL — ABNORMAL LOW (ref 8.4–10.5)
Calcium: 7.8 mg/dL — ABNORMAL LOW (ref 8.4–10.5)
Chloride: 99 mEq/L (ref 96–112)
Chloride: 101 mEq/L (ref 96–112)
Creatinine, Ser: 1.41 mg/dL — ABNORMAL HIGH (ref 0.50–1.10)
Creatinine, Ser: 1.35 mg/dL — ABNORMAL HIGH (ref 0.50–1.10)
GFR calc Af Amer: 59 mL/min — ABNORMAL LOW (ref >90)
Glucose, Bld: 127 mg/dL — ABNORMAL HIGH (ref 70–99)

## 2013-04-11 LAB — GLUCOSE, CAPILLARY
Glucose-Capillary: 118 mg/dL — ABNORMAL HIGH (ref 70–99)
Glucose-Capillary: 122 mg/dL — ABNORMAL HIGH (ref 70–99)
Glucose-Capillary: 124 mg/dL — ABNORMAL HIGH (ref 70–99)
Glucose-Capillary: 179 mg/dL — ABNORMAL HIGH (ref 70–99)
Glucose-Capillary: 285 mg/dL — ABNORMAL HIGH (ref 70–99)
Glucose-Capillary: 374 mg/dL — ABNORMAL HIGH (ref 70–99)

## 2013-04-11 MED ORDER — DEXTROSE 50 % IV SOLN: 25.0000 mL | INTRAVENOUS | Status: DC | PRN

## 2013-04-11 MED ORDER — PANTOPRAZOLE SODIUM 40 MG PO TBEC
40.0000 mg | DELAYED_RELEASE_TABLET | Freq: Every day | ORAL | Status: DC
  Administered 2013-04-11: 40 mg via ORAL
  Filled 2013-04-11: qty 1

## 2013-04-11 MED ORDER — PANTOPRAZOLE SODIUM 40 MG PO TBEC: 40.0000 mg | DELAYED_RELEASE_TABLET | Freq: Every day | ORAL | Status: DC

## 2013-04-11 MED ORDER — INSULIN GLARGINE 100 UNIT/ML ~~LOC~~ SOLN
12.0000 [IU] | Freq: Every day | SUBCUTANEOUS | Status: DC
  Administered 2013-04-11: 09:00:00 12 [IU] via SUBCUTANEOUS
  Filled 2013-04-11: qty 0.12

## 2013-04-11 MED ORDER — SODIUM BICARBONATE 650 MG PO TABS: 650.0000 mg | ORAL_TABLET | Freq: Two times a day (BID) | ORAL | Status: DC

## 2013-04-11 MED ORDER — METOCLOPRAMIDE HCL 5 MG/5ML PO SOLN: 5.0000 mg | Freq: Three times a day (TID) | ORAL | Status: DC

## 2013-04-11 MED ORDER — ENOXAPARIN SODIUM 40 MG/0.4ML ~~LOC~~ SOLN
40.0000 mg | SUBCUTANEOUS | Status: DC
  Filled 2013-04-11: qty 0.4

## 2013-04-11 MED ORDER — SODIUM CHLORIDE 0.9 % IV SOLN
INTRAVENOUS | Status: DC
  Administered 2013-04-11: 01:00:00 via INTRAVENOUS

## 2013-04-11 MED ORDER — SODIUM CHLORIDE 0.9 % IV SOLN
INTRAVENOUS | Status: DC
  Administered 2013-04-11: 01:00:00 via INTRAVENOUS
  Filled 2013-04-11: qty 1

## 2013-04-11 MED ORDER — DEXTROSE-NACL 5-0.45 % IV SOLN: INTRAVENOUS | Status: DC

## 2013-04-11 MED ORDER — POTASSIUM CHLORIDE 10 MEQ/100ML IV SOLN
10.0000 meq | INTRAVENOUS | Status: AC
  Administered 2013-04-11 (×2): 10 meq via INTRAVENOUS
  Filled 2013-04-11 (×2): qty 100

## 2013-04-11 NOTE — Progress Notes (Signed)
  Pt admitted to the unit. Pt is stable, alert and oriented per baseline. Oriented to room, staff, and call bell. Educated to call for any assistance. Bed in lowest position, call bell within reach- will continue to monitor. 

## 2013-04-11 NOTE — ED Provider Notes (Signed)
Medical screening examination/treatment/procedure(s) were performed by non-physician practitioner and as supervising physician I was immediately available for consultation/collaboration.  EKG Interpretation     Ventricular Rate:    PR Interval:    QRS Duration:   QT Interval:    QTC Calculation:   R Axis:     Text Interpretation:               Glynn Octave, MD 04/11/13 1103

## 2013-04-11 NOTE — Discharge Summary (Signed)
PATIENT DETAILS Name: [name] Ross Age: 27 y.o. Sex: female Date of Birth: 1985/11/01 MRN: [id number]. Admit Date: 04/10/2013 Admitting Physician: Haydee Monica, MD PCP:No PCP Per Patient  Recommendations for Outpatient Follow-up:  1. Continue with Reglan and reassess symptoms. 2. Please optimize diabetes control.  PRIMARY DISCHARGE DIAGNOSIS:  Principal Problem:   DKA, type 1 Active Problems:   Metabolic acidosis   Diabetic gastroparesis   Chronic nausea   Nausea and vomiting      PAST MEDICAL HISTORY: Past Medical History  Diagnosis Date  . Diabetes mellitus   . Hypothyroidism   . Anemia     DISCHARGE MEDICATIONS:   Medication List         insulin aspart 100 UNIT/ML injection  Commonly known as:  novoLOG  - Inject 0-15 units into the skin 3 times daily before meals per sliding scale.  -   - CBG < 70: drink juice and or milk and call physician or 911   -   CBG 70 - 120: 0 units     -   CBG > 400 call MD and obtain STAT lab verification     -   CBG 121 - 150: 2 units     -   CBG 151 - 200: 3 units     -   CBG 201 - 250: 5 units     -   CBG 251 - 300: 8 units     -   CBG 301 - 350: 11 units     -   CBG 351 - 400: 15 units     insulin detemir 100 unit/ml Soln  Commonly known as:  LEVEMIR  Inject 8-10 Units into the skin 2 (two) times daily. 10 units in the am & 8 units in the pm     metoCLOPramide 5 MG/5ML solution  Commonly known as:  REGLAN  Take 5 mLs (5 mg total) by mouth 4 (four) times daily -  before meals and at bedtime.     pantoprazole 40 MG tablet  Commonly known as:  PROTONIX  Take 1 tablet (40 mg total) by mouth daily at 12 noon.     sodium bicarbonate 650 MG tablet  Take 1 tablet (650 mg total) by mouth 2 (two) times daily.        ALLERGIES:  No Known Allergies  BRIEF HPI:  See H&P, Labs, Consult and Test reports for all details in brief, patient is a 27 year old female with a history of diabetic gastroparesis not on  Reglan who presented to the emergency room complaining of abdominal pain along with nausea and vomiting. Because of nausea and vomiting she was not using insulin, she was found to be in diabetic ketoacidosis and admitted to the hospital for further evaluation and treatment.  CONSULTATIONS:   None  PERTINENT RADIOLOGIC STUDIES: No results found.   PERTINENT LAB RESULTS: CBC:  Recent Labs  04/10/13 1533 04/10/13 1604  WBC 9.7  --   HGB 15.3* 17.0*  HCT 43.3 50.0*  PLT 315  --    CMET CMP     Component Value Date/Time   NA 131* 04/11/2013 0606   K 3.6 04/11/2013 0606   CL 101 04/11/2013 0606   CO2 17* 04/11/2013 0606   GLUCOSE 127* 04/11/2013 0606   BUN 15 04/11/2013 0606   CREATININE 1.35* 04/11/2013 0606   CALCIUM 7.8* 04/11/2013 0606   PROT 6.4 04/10/2013 2030   ALBUMIN 3.1* 04/10/2013 2030   AST 14  04/10/2013 2030   ALT 12 04/10/2013 2030   ALKPHOS 80 04/10/2013 2030   BILITOT 0.4 04/10/2013 2030   GFRNONAA 54* 04/11/2013 0606   GFRAA 63* 04/11/2013 0606    GFR Estimated Creatinine Clearance: 48.8 ml/min (by C-G formula based on Cr of 1.35).  Recent Labs  04/10/13 2030  LIPASE 11   No results found for this basename: CKTOTAL, CKMB, CKMBINDEX, TROPONINI,  in the last 72 hours No components found with this basename: POCBNP,  No results found for this basename: DDIMER,  in the last 72 hours No results found for this basename: HGBA1C,  in the last 72 hours No results found for this basename: CHOL, HDL, LDLCALC, TRIG, CHOLHDL, LDLDIRECT,  in the last 72 hours No results found for this basename: TSH, T4TOTAL, FREET3, T3FREE, THYROIDAB,  in the last 72 hours No results found for this basename: VITAMINB12, FOLATE, FERRITIN, TIBC, IRON, RETICCTPCT,  in the last 72 hours Coags: No results found for this basename: PT, INR,  in the last 72 hours Microbiology: No results found for this or any previous visit (from the past 240 hour(s)).   BRIEF HOSPITAL COURSE:    Principal Problem:   DKA, type 1 - Patient was admitted and started on IV fluids, was also placed on IV insulin for glucose stabilizer protocol. She was also in acute renal failure likely prerenal. With clinical improvement, she was transitioned to subcutaneous insulin and sugars continued to be stable. At this time, although symptoms of nausea vomiting and abdominal pain have resolved. She is tolerating a diet. She is considered stable to be discharged home for further monitoring to be done in the outpatient setting by her primary care practitioner.  Active Problems: Nausea, vomiting and abdominal pain - Is likely secondary to gastroparesis flare, she was started on Reglan with complete resolution of the symptoms. As noted above, she is doing so much better than on admission, abdomen is completely benign exam without any tenderness at all. We'll continue with Reglan on discharge.  Metabolic acidosis - Likely a combination from diabetic ketoacidosis and acute renal failure - Percent with a bicarbonate of 11, by the time of discharge increased to 17 with an of 13. Reviewed prior admission notes, apparently patient has had persistent/lingering metabolic acidosis attributed it to her renal issues. We will start her on bicarbonate supplementation and have her followup with her primary care practitioner.    Diabetic gastroparesis - Will continue with Reglan on discharge. I have counseled her importance of eating small portion meals. She is claims understanding.  TODAY-DAY OF DISCHARGE:  Subjective:   [name] Ross today has no headache,no chest abdominal pain,no new weakness tingling or numbness, feels much better wants to go home today.   Objective:   Blood pressure 113/72, pulse 91, temperature 98.2 F (36.8 C), temperature source Oral, resp. rate 20, height 5\' 2"  (1.575 m), weight 48.535 kg (107 lb), last menstrual period 02/16/2013, SpO2 100.00%. No intake or output data in the 24  hours ending 04/11/13 1312 Filed Weights   04/11/13 0006  Weight: 48.535 kg (107 lb)    Exam Awake Alert, Oriented *3, No new F.N deficits, Normal affect [place].AT,PERRAL Supple Neck,No JVD, No cervical lymphadenopathy appriciated.  Symmetrical Chest wall movement, Good air movement bilaterally, CTAB RRR,No Gallops,Rubs or new Murmurs, No Parasternal Heave +ve B.Sounds, Abd Soft, Non tender, No organomegaly appriciated, No rebound -guarding or rigidity. No Cyanosis, Clubbing or edema, No new Rash or bruise  DISCHARGE CONDITION: Stable  DISPOSITION:  Home   DISCHARGE INSTRUCTIONS:    Activity:  As tolerated   Diet recommendation: Diabetic Diet Heart Healthy diet       Discharge Orders   Future Orders Complete By Expires   Call MD for:  persistant dizziness or light-headedness  As directed    Call MD for:  persistant nausea and vomiting  As directed    Diet - low sodium heart healthy  As directed    Increase activity slowly  As directed       Follow-up Information   Follow up with Primary care practitioner. Schedule an appointment as soon as possible for a visit in 1 week.      Total Time spent on discharge equals 45 minutes.  SignedJeoffrey Massed 04/11/2013 1:12 PM

## 2013-04-11 NOTE — Progress Notes (Signed)
Guerry Minors to be D/C'd Home per MD order.  Discussed with the patient and all questions fully answered.    Medication List         insulin aspart 100 UNIT/ML injection  Commonly known as:  novoLOG  - Inject 0-15 units into the skin 3 times daily before meals per sliding scale.  -   - CBG < 70: drink juice and or milk and call physician or 911   -   CBG 70 - 120: 0 units     -   CBG > 400 call MD and obtain STAT lab verification     -   CBG 121 - 150: 2 units     -   CBG 151 - 200: 3 units     -   CBG 201 - 250: 5 units     -   CBG 251 - 300: 8 units     -   CBG 301 - 350: 11 units     -   CBG 351 - 400: 15 units     insulin detemir 100 unit/ml Soln  Commonly known as:  LEVEMIR  Inject 8-10 Units into the skin 2 (two) times daily. 10 units in the am & 8 units in the pm     metoCLOPramide 5 MG/5ML solution  Commonly known as:  REGLAN  Take 5 mLs (5 mg total) by mouth 4 (four) times daily -  before meals and at bedtime.     pantoprazole 40 MG tablet  Commonly known as:  PROTONIX  Take 1 tablet (40 mg total) by mouth daily at 12 noon.     sodium bicarbonate 650 MG tablet  Take 1 tablet (650 mg total) by mouth 2 (two) times daily.        VVS, Skin clean, dry and intact without evidence of skin break down, no evidence of skin tears noted. IV catheter discontinued intact. Site without signs and symptoms of complications. Dressing and pressure applied.  An After Visit Summary was printed and given to the patient. Follow up appointments , new prescriptions and medication administration times given.Discussed with pt s/s of DKA and provided handout. Pt questions answered.  Patient escorted via WC, and D/C home via private auto.  Cindra Eves, RN 04/11/2013 3:44 PM

## 2013-05-17 ENCOUNTER — Inpatient Hospital Stay: Payer: Self-pay | Admitting: Internal Medicine

## 2013-05-17 LAB — COMPREHENSIVE METABOLIC PANEL
Albumin: 3.2 g/dL — ABNORMAL LOW (ref 3.4–5.0)
Alkaline Phosphatase: 175 U/L — ABNORMAL HIGH
Alkaline Phosphatase: 142 U/L — ABNORMAL HIGH
Anion Gap: 25 — ABNORMAL HIGH (ref 7–16)
BUN: 107 mg/dL — ABNORMAL HIGH (ref 7–18)
BUN: 95 mg/dL — ABNORMAL HIGH (ref 7–18)
Bilirubin,Total: 0.3 mg/dL (ref 0.2–1.0)
Calcium, Total: 6.9 mg/dL — CL (ref 8.5–10.1)
Calcium, Total: 5.8 mg/dL — CL (ref 8.5–10.1)
Chloride: 101 mmol/L (ref 98–107)
Co2: 5 mmol/L — CL (ref 21–32)
Co2: 9 mmol/L — CL (ref 21–32)
Creatinine: 6.65 mg/dL — ABNORMAL HIGH (ref 0.60–1.30)
EGFR (African American): 9 — ABNORMAL LOW
EGFR (African American): 10 — ABNORMAL LOW
Glucose: 1476 mg/dL (ref 65–99)
Osmolality: 387 (ref 275–301)
Osmolality: 376 (ref 275–301)
Potassium: 7.1 mmol/L (ref 3.5–5.1)
Potassium: 3.4 mmol/L — ABNORMAL LOW (ref 3.5–5.1)
SGOT(AST): 45 U/L — ABNORMAL HIGH (ref 15–37)
SGPT (ALT): 29 U/L (ref 12–78)
Sodium: 135 mmol/L — ABNORMAL LOW (ref 136–145)
Total Protein: 5.7 g/dL — ABNORMAL LOW (ref 6.4–8.2)

## 2013-05-17 LAB — URINALYSIS, COMPLETE
Bacteria: NONE SEEN
Bilirubin,UR: NEGATIVE
Glucose,UR: >=500 mg/dL (ref 0–75)
Nitrite: NEGATIVE
Ph: 5 (ref 4.5–8.0)
Specific Gravity: 1.018 (ref 1.003–1.030)
Squamous Epithelial: <1

## 2013-05-17 LAB — BASIC METABOLIC PANEL
Anion Gap: 23 — ABNORMAL HIGH (ref 7–16)
Anion Gap: 22 — ABNORMAL HIGH (ref 7–16)
Anion Gap: 23 — ABNORMAL HIGH (ref 7–16)
BUN: 91 mg/dL — ABNORMAL HIGH (ref 7–18)
BUN: 83 mg/dL — ABNORMAL HIGH (ref 7–18)
Calcium, Total: 5.7 mg/dL — CL (ref 8.5–10.1)
Calcium, Total: 5.9 mg/dL — CL (ref 8.5–10.1)
Calcium, Total: 6.1 mg/dL — CL (ref 8.5–10.1)
Calcium, Total: 6.2 mg/dL — CL (ref 8.5–10.1)
Calcium, Total: 6.5 mg/dL — CL (ref 8.5–10.1)
Chloride: 105 mmol/L (ref 98–107)
Chloride: 114 mmol/L — ABNORMAL HIGH (ref 98–107)
Co2: 9 mmol/L — CL (ref 21–32)
Co2: 9 mmol/L — CL (ref 21–32)
Co2: 10 mmol/L — CL (ref 21–32)
Co2: 10 mmol/L — CL (ref 21–32)
Creatinine: 5.99 mg/dL — ABNORMAL HIGH (ref 0.60–1.30)
Creatinine: 6.29 mg/dL — ABNORMAL HIGH (ref 0.60–1.30)
Creatinine: 6.16 mg/dL — ABNORMAL HIGH (ref 0.60–1.30)
Creatinine: 6.26 mg/dL — ABNORMAL HIGH (ref 0.60–1.30)
EGFR (African American): 10 — ABNORMAL LOW
EGFR (African American): 10 — ABNORMAL LOW
EGFR (African American): 10 — ABNORMAL LOW
EGFR (Non-African Amer.): 8 — ABNORMAL LOW
EGFR (Non-African Amer.): 8 — ABNORMAL LOW
EGFR (Non-African Amer.): 8 — ABNORMAL LOW
Glucose: 1607 mg/dL (ref 65–99)
Glucose: 1154 mg/dL (ref 65–99)
Glucose: 871 mg/dL (ref 65–99)
Osmolality: 381 (ref 275–301)
Osmolality: 369 (ref 275–301)
Potassium: 4.2 mmol/L (ref 3.5–5.1)
Potassium: 3.7 mmol/L (ref 3.5–5.1)
Potassium: 3.4 mmol/L — ABNORMAL LOW (ref 3.5–5.1)
Potassium: 3.4 mmol/L — ABNORMAL LOW (ref 3.5–5.1)
Sodium: 134 mmol/L — ABNORMAL LOW (ref 136–145)
Sodium: 137 mmol/L (ref 136–145)

## 2013-05-17 LAB — CBC WITH DIFFERENTIAL/PLATELET
Basophil #: 0 10*3/uL (ref 0.0–0.1)
Basophil %: 0.5 %
Eosinophil #: 0 10*3/uL (ref 0.0–0.7)
HCT: 36.4 % (ref 35.0–47.0)
MCH: 29.6 pg (ref 26.0–34.0)
MCHC: <26.5 g/dL (ref 32.0–36.0)
MCV: 116 fL — ABNORMAL HIGH (ref 80–100)
Monocyte %: 1.9 %
RBC: 3.15 10*6/uL — ABNORMAL LOW (ref 3.80–5.20)
WBC: 9.6 10*3/uL (ref 3.6–11.0)

## 2013-05-17 LAB — DRUG SCREEN, URINE
Barbiturates, Ur Screen: NEGATIVE (ref ?–200)
Benzodiazepine, Ur Scrn: NEGATIVE (ref ?–200)
Cocaine Metabolite,Ur ~~LOC~~: NEGATIVE (ref ?–300)
MDMA (Ecstasy)Ur Screen: NEGATIVE (ref ?–500)
Opiate, Ur Screen: NEGATIVE (ref ?–300)
Phencyclidine (PCP) Ur S: NEGATIVE (ref ?–25)
Tricyclic, Ur Screen: NEGATIVE (ref ?–1000)

## 2013-05-17 LAB — MAGNESIUM: Magnesium: 3.1 mg/dL — ABNORMAL HIGH

## 2013-05-17 LAB — TSH: Thyroid Stimulating Horm: 2.5 u[IU]/mL

## 2013-05-17 LAB — LIPASE, BLOOD: Lipase: 92 U/L (ref 73–393)

## 2013-05-18 LAB — BASIC METABOLIC PANEL
Anion Gap: 16 (ref 7–16)
Anion Gap: 21 — ABNORMAL HIGH (ref 7–16)
BUN: 78 mg/dL — ABNORMAL HIGH (ref 7–18)
BUN: 82 mg/dL — ABNORMAL HIGH (ref 7–18)
BUN: 77 mg/dL — ABNORMAL HIGH (ref 7–18)
Calcium, Total: 6 mg/dL — CL (ref 8.5–10.1)
Calcium, Total: 6.2 mg/dL — CL (ref 8.5–10.1)
Calcium, Total: 6.5 mg/dL — CL (ref 8.5–10.1)
Chloride: 120 mmol/L — ABNORMAL HIGH (ref 98–107)
Chloride: 121 mmol/L — ABNORMAL HIGH (ref 98–107)
Chloride: 128 mmol/L — ABNORMAL HIGH (ref 98–107)
Co2: 10 mmol/L — CL (ref 21–32)
Co2: 13 mmol/L — ABNORMAL LOW (ref 21–32)
Co2: 19 mmol/L — ABNORMAL LOW (ref 21–32)
Co2: 22 mmol/L (ref 21–32)
Creatinine: 5.25 mg/dL — ABNORMAL HIGH (ref 0.60–1.30)
EGFR (African American): 10 — ABNORMAL LOW
EGFR (African American): 12 — ABNORMAL LOW
EGFR (African American): 12 — ABNORMAL LOW
EGFR (African American): 13 — ABNORMAL LOW
EGFR (Non-African Amer.): 10 — ABNORMAL LOW
EGFR (Non-African Amer.): 10 — ABNORMAL LOW
EGFR (Non-African Amer.): 11 — ABNORMAL LOW
EGFR (Non-African Amer.): 9 — ABNORMAL LOW
Glucose: 291 mg/dL — ABNORMAL HIGH (ref 65–99)
Glucose: 488 mg/dL — ABNORMAL HIGH (ref 65–99)
Glucose: 319 mg/dL — ABNORMAL HIGH (ref 65–99)
Osmolality: 344 (ref 275–301)
Osmolality: 350 (ref 275–301)
Potassium: 3.6 mmol/L (ref 3.5–5.1)
Potassium: 4.1 mmol/L (ref 3.5–5.1)
Potassium: 5.1 mmol/L (ref 3.5–5.1)
Potassium: 5.4 mmol/L — ABNORMAL HIGH (ref 3.5–5.1)
Sodium: 157 mmol/L — ABNORMAL HIGH (ref 136–145)
Sodium: 150 mmol/L — ABNORMAL HIGH (ref 136–145)
Sodium: 148 mmol/L — ABNORMAL HIGH (ref 136–145)

## 2013-05-18 LAB — COMPREHENSIVE METABOLIC PANEL
Albumin: 2.3 g/dL — ABNORMAL LOW (ref 3.4–5.0)
Alkaline Phosphatase: 86 U/L
BUN: 83 mg/dL — ABNORMAL HIGH (ref 7–18)
Bilirubin,Total: 0.3 mg/dL (ref 0.2–1.0)
EGFR (African American): 11 — ABNORMAL LOW
EGFR (Non-African Amer.): 10 — ABNORMAL LOW
Glucose: 203 mg/dL — ABNORMAL HIGH (ref 65–99)
Osmolality: 338 (ref 275–301)
SGOT(AST): 152 U/L — ABNORMAL HIGH (ref 15–37)
Sodium: 155 mmol/L — ABNORMAL HIGH (ref 136–145)

## 2013-05-18 LAB — PHOSPHORUS: Phosphorus: 5.5 mg/dL — ABNORMAL HIGH (ref 2.5–4.9)

## 2013-05-18 LAB — URINALYSIS, COMPLETE
Glucose,UR: >=500 mg/dL (ref 0–75)
Ketone: NEGATIVE
Nitrite: NEGATIVE
RBC,UR: 23 /HPF (ref 0–5)
Specific Gravity: 1.008 (ref 1.003–1.030)
Squamous Epithelial: 1
WBC UR: 3 /HPF (ref 0–5)

## 2013-05-18 LAB — CBC WITH DIFFERENTIAL/PLATELET
Comment - H1-Com5: NORMAL
Eosinophil #: 0.3 10*3/uL (ref 0.0–0.7)
Eosinophil %: 2.8 %
HCT: 28 % — ABNORMAL LOW (ref 35.0–47.0)
HGB: 9.2 g/dL — ABNORMAL LOW (ref 12.0–16.0)
HGB: 8.8 g/dL — ABNORMAL LOW (ref 12.0–16.0)
Lymphocyte #: 0.2 10*3/uL — ABNORMAL LOW (ref 1.0–3.6)
Lymphocyte %: 1.7 %
Lymphocytes: 12 %
MCH: 29.9 pg (ref 26.0–34.0)
MCH: 29.3 pg (ref 26.0–34.0)
MCHC: 32.5 g/dL (ref 32.0–36.0)
MCV: 86 fL (ref 80–100)
MCV: 90 fL (ref 80–100)
Metamyelocyte: 3 %
Monocyte #: 0.4 x10 3/mm (ref 0.2–0.9)
Monocyte %: 3.3 %
Monocytes: 5 %
Myelocyte: 2 %
NRBC/100 WBC: 1 /
Neutrophil #: 11 10*3/uL — ABNORMAL HIGH (ref 1.4–6.5)
Neutrophil %: 92.1 %
Platelet: 186 10*3/uL (ref 150–440)
Platelet: 96 10*3/uL — ABNORMAL LOW (ref 150–440)
Platelet: 175 10*3/uL (ref 150–440)
RBC: 3.13 10*6/uL — ABNORMAL LOW (ref 3.80–5.20)
RBC: 3.1 10*6/uL — ABNORMAL LOW (ref 3.80–5.20)
Segmented Neutrophils: 63 %
WBC: 20.2 10*3/uL — ABNORMAL HIGH (ref 3.6–11.0)

## 2013-05-18 LAB — APTT: Activated PTT: 47.3 secs — ABNORMAL HIGH (ref 23.6–35.9)

## 2013-05-18 LAB — PREGNANCY, URINE: Pregnancy Test, Urine: NEGATIVE m[IU]/mL

## 2013-05-18 LAB — PROTIME-INR
INR: 1.6
Prothrombin Time: 19 secs — ABNORMAL HIGH (ref 11.5–14.7)
Prothrombin Time: 24.8 secs — ABNORMAL HIGH (ref 11.5–14.7)

## 2013-05-18 LAB — TROPONIN I
Troponin-I: >40 ng/mL
Troponin-I: >40 ng/mL
Troponin-I: >40 ng/mL

## 2013-05-18 LAB — MAGNESIUM: Magnesium: 1.9 mg/dL

## 2013-05-19 LAB — CBC WITH DIFFERENTIAL/PLATELET
Comment - H1-Com3: NORMAL
HCT: 24.2 % — ABNORMAL LOW (ref 35.0–47.0)
Lymphocytes: 34 %
MCHC: 35.3 g/dL (ref 32.0–36.0)
MCV: 85 fL (ref 80–100)
Monocytes: 3 %
RBC: 2.84 10*6/uL — ABNORMAL LOW (ref 3.80–5.20)

## 2013-05-19 LAB — BASIC METABOLIC PANEL
Anion Gap: 11 (ref 7–16)
Chloride: 110 mmol/L — ABNORMAL HIGH (ref 98–107)
Co2: 24 mmol/L (ref 21–32)
EGFR (African American): 14 — ABNORMAL LOW
EGFR (Non-African Amer.): 12 — ABNORMAL LOW
Glucose: 117 mg/dL — ABNORMAL HIGH (ref 65–99)
Sodium: 145 mmol/L (ref 136–145)

## 2013-05-19 LAB — PROTIME-INR: Prothrombin Time: 26.4 secs — ABNORMAL HIGH (ref 11.5–14.7)

## 2013-05-19 LAB — TROPONIN I: Troponin-I: >40 ng/mL

## 2013-05-19 LAB — HEPATIC FUNCTION PANEL A (ARMC)
Bilirubin, Direct: 0.2 mg/dL (ref 0.00–0.20)
Bilirubin,Total: 0.4 mg/dL (ref 0.2–1.0)
SGOT(AST): >2000 U/L — ABNORMAL HIGH (ref 15–37)
SGPT (ALT): 790 U/L — ABNORMAL HIGH (ref 12–78)

## 2013-05-19 LAB — APTT: Activated PTT: >160 secs (ref 23.6–35.9)

## 2013-05-19 LAB — MAGNESIUM: Magnesium: 2 mg/dL

## 2013-05-19 LAB — URINE CULTURE

## 2013-05-19 LAB — PHOSPHORUS: Phosphorus: 4 mg/dL (ref 2.5–4.9)

## 2013-05-20 LAB — PROTIME-INR: Prothrombin Time: 16.7 secs — ABNORMAL HIGH (ref 11.5–14.7)

## 2013-05-20 LAB — CBC WITH DIFFERENTIAL/PLATELET
HCT: 22.6 % — ABNORMAL LOW (ref 35.0–47.0)
HGB: 7.6 g/dL — ABNORMAL LOW (ref 12.0–16.0)
HGB: 7.5 g/dL — ABNORMAL LOW (ref 12.0–16.0)
Lymphocytes: 34 %
Lymphocytes: 31 %
MCH: 28.9 pg (ref 26.0–34.0)
Monocytes: 1 %
Monocytes: 1 %
Platelet: 60 10*3/uL — ABNORMAL LOW (ref 150–440)
RBC: 2.64 10*6/uL — ABNORMAL LOW (ref 3.80–5.20)
RBC: 2.6 10*6/uL — ABNORMAL LOW (ref 3.80–5.20)
RDW: 17.4 % — ABNORMAL HIGH (ref 11.5–14.5)
Segmented Neutrophils: 65 %
WBC: 9.1 10*3/uL (ref 3.6–11.0)
WBC: 8.2 10*3/uL (ref 3.6–11.0)

## 2013-05-20 LAB — COMPREHENSIVE METABOLIC PANEL
Albumin: 1.8 g/dL — ABNORMAL LOW (ref 3.4–5.0)
Anion Gap: 12 (ref 7–16)
BUN: 63 mg/dL — ABNORMAL HIGH (ref 7–18)
Co2: 25 mmol/L (ref 21–32)
Potassium: 3.6 mmol/L (ref 3.5–5.1)
SGPT (ALT): 619 U/L — ABNORMAL HIGH (ref 12–78)
Sodium: 139 mmol/L (ref 136–145)
Total Protein: 4.5 g/dL — ABNORMAL LOW (ref 6.4–8.2)

## 2013-05-20 LAB — URINALYSIS, COMPLETE
Bacteria: NONE SEEN
Bilirubin,UR: NEGATIVE
Glucose,UR: NEGATIVE mg/dL (ref 0–75)
Ketone: NEGATIVE
Nitrite: NEGATIVE
Specific Gravity: 1.009 (ref 1.003–1.030)
Squamous Epithelial: NONE SEEN
Transitional Epi: 6
WBC UR: 74 /HPF (ref 0–5)

## 2013-05-20 LAB — PHOSPHORUS: Phosphorus: 4.5 mg/dL (ref 2.5–4.9)

## 2013-05-20 LAB — APTT: Activated PTT: 51.7 secs — ABNORMAL HIGH (ref 23.6–35.9)

## 2013-05-20 LAB — URINE CULTURE

## 2013-05-21 LAB — COMPREHENSIVE METABOLIC PANEL
Albumin: 1.9 g/dL — ABNORMAL LOW (ref 3.4–5.0)
Alkaline Phosphatase: 239 U/L — ABNORMAL HIGH
Anion Gap: 6 — ABNORMAL LOW (ref 7–16)
Calcium, Total: 5.7 mg/dL — CL (ref 8.5–10.1)
Chloride: 103 mmol/L (ref 98–107)
Co2: 28 mmol/L (ref 21–32)
EGFR (Non-African Amer.): 10 — ABNORMAL LOW
Osmolality: 292 (ref 275–301)
SGOT(AST): 940 U/L — ABNORMAL HIGH (ref 15–37)
Sodium: 137 mmol/L (ref 136–145)
Total Protein: 4.9 g/dL — ABNORMAL LOW (ref 6.4–8.2)

## 2013-05-21 LAB — CBC WITH DIFFERENTIAL/PLATELET
Bands: 2 %
Basophil #: 0 10*3/uL (ref 0.0–0.1)
Basophil #: 0 10*3/uL (ref 0.0–0.1)
Basophil %: 0.4 %
Basophil %: 0.6 %
Eosinophil #: 0.1 10*3/uL (ref 0.0–0.7)
Eosinophil #: 0.2 10*3/uL (ref 0.0–0.7)
Eosinophil %: 1.9 %
Eosinophil: 1 %
HCT: 20 % — ABNORMAL LOW (ref 35.0–47.0)
HCT: 21.6 % — ABNORMAL LOW (ref 35.0–47.0)
Lymphocyte #: 1.6 10*3/uL (ref 1.0–3.6)
Lymphocyte #: 1.9 10*3/uL (ref 1.0–3.6)
Lymphocyte %: 28 %
Lymphocytes: 30 %
MCH: 28.6 pg (ref 26.0–34.0)
MCHC: 35 g/dL (ref 32.0–36.0)
MCHC: 34.1 g/dL (ref 32.0–36.0)
MCV: 84 fL (ref 80–100)
MCV: 84 fL (ref 80–100)
MCV: 84 fL (ref 80–100)
Monocyte #: 0.1 x10 3/mm — ABNORMAL LOW (ref 0.2–0.9)
Monocyte %: 3.3 %
Monocytes: 5 %
Neutrophil #: 3.9 10*3/uL (ref 1.4–6.5)
Neutrophil #: 4.2 10*3/uL (ref 1.4–6.5)
Platelet: 26 10*3/uL — CL (ref 150–440)
RBC: 2.39 10*6/uL — ABNORMAL LOW (ref 3.80–5.20)
RBC: 2.56 10*6/uL — ABNORMAL LOW (ref 3.80–5.20)
RDW: 17.1 % — ABNORMAL HIGH (ref 11.5–14.5)
RDW: 17.3 % — ABNORMAL HIGH (ref 11.5–14.5)
WBC: 4.5 10*3/uL (ref 3.6–11.0)
WBC: 5.8 10*3/uL (ref 3.6–11.0)
WBC: 6.4 10*3/uL (ref 3.6–11.0)

## 2013-05-21 LAB — AMMONIA: Ammonia, Plasma: <25 mcmol/L (ref 11–32)

## 2013-05-22 LAB — COMPREHENSIVE METABOLIC PANEL
Anion Gap: 12 (ref 7–16)
BUN: 68 mg/dL — ABNORMAL HIGH (ref 7–18)
Bilirubin,Total: 1.3 mg/dL — ABNORMAL HIGH (ref 0.2–1.0)
Calcium, Total: 6.4 mg/dL — CL (ref 8.5–10.1)
Chloride: 100 mmol/L (ref 98–107)
Co2: 25 mmol/L (ref 21–32)
EGFR (African American): 10 — ABNORMAL LOW
Glucose: 57 mg/dL — ABNORMAL LOW (ref 65–99)
Potassium: 3.6 mmol/L (ref 3.5–5.1)
SGPT (ALT): 530 U/L — ABNORMAL HIGH (ref 12–78)
Total Protein: 5.2 g/dL — ABNORMAL LOW (ref 6.4–8.2)

## 2013-05-22 LAB — CBC WITH DIFFERENTIAL/PLATELET
Basophil #: 0 10*3/uL (ref 0.0–0.1)
Basophil %: 1 %
Basophil %: 0.3 %
Eosinophil #: 0.1 10*3/uL (ref 0.0–0.7)
Eosinophil %: 1.4 %
Eosinophil %: 1.4 %
Eosinophil %: 1 %
HCT: 24.9 % — ABNORMAL LOW (ref 35.0–47.0)
HCT: 25.4 % — ABNORMAL LOW (ref 35.0–47.0)
HGB: 8.7 g/dL — ABNORMAL LOW (ref 12.0–16.0)
HGB: 8.6 g/dL — ABNORMAL LOW (ref 12.0–16.0)
Lymphocyte #: 1.6 10*3/uL (ref 1.0–3.6)
Lymphocyte #: 1.4 10*3/uL (ref 1.0–3.6)
Lymphocyte #: 1.3 10*3/uL (ref 1.0–3.6)
Lymphocyte %: 27.9 %
MCH: 29.6 pg (ref 26.0–34.0)
MCH: 30 pg (ref 26.0–34.0)
MCH: 29.5 pg (ref 26.0–34.0)
MCHC: 35.1 g/dL (ref 32.0–36.0)
MCV: 86 fL (ref 80–100)
MCV: 86 fL (ref 80–100)
MCV: 88 fL (ref 80–100)
Monocyte #: 0.2 x10 3/mm (ref 0.2–0.9)
Monocyte #: 0.2 x10 3/mm (ref 0.2–0.9)
Monocyte %: 4.6 %
Monocyte %: 7 %
Neutrophil #: 3.6 10*3/uL (ref 1.4–6.5)
Neutrophil %: 64.2 %
Neutrophil %: 65.6 %
Platelet: 22 10*3/uL — CL (ref 150–440)
Platelet: 26 10*3/uL — CL (ref 150–440)
RBC: 2.93 10*6/uL — ABNORMAL LOW (ref 3.80–5.20)
RBC: 2.92 10*6/uL — ABNORMAL LOW (ref 3.80–5.20)
RBC: 2.9 10*6/uL — ABNORMAL LOW (ref 3.80–5.20)
RDW: 17.1 % — ABNORMAL HIGH (ref 11.5–14.5)
WBC: 5.5 10*3/uL (ref 3.6–11.0)
WBC: 5.4 10*3/uL (ref 3.6–11.0)

## 2013-05-22 LAB — CLOSTRIDIUM DIFFICILE(ARMC)

## 2013-05-22 LAB — IRON AND TIBC: Iron: 67 ug/dL (ref 50–170)

## 2013-05-22 LAB — CULTURE, BLOOD (SINGLE)

## 2013-05-22 LAB — FERRITIN: Ferritin (ARMC): 234 ng/mL (ref 8–388)

## 2013-05-22 LAB — RETICULOCYTES: Reticulocyte: 0.29 % — ABNORMAL LOW (ref 0.4–3.1)

## 2013-05-23 LAB — CBC WITH DIFFERENTIAL/PLATELET
Basophil #: 0 10*3/uL (ref 0.0–0.1)
Eosinophil #: 0.1 10*3/uL (ref 0.0–0.7)
HCT: 23.7 % — ABNORMAL LOW (ref 35.0–47.0)
HGB: 8.1 g/dL — ABNORMAL LOW (ref 12.0–16.0)
Lymphocyte %: 24.3 %
MCH: 29.6 pg (ref 26.0–34.0)
MCHC: 34.3 g/dL (ref 32.0–36.0)
MCV: 86 fL (ref 80–100)
Monocyte #: 0.4 x10 3/mm (ref 0.2–0.9)
Monocyte %: 7.7 %
Neutrophil #: 3.5 10*3/uL (ref 1.4–6.5)
RBC: 2.75 10*6/uL — ABNORMAL LOW (ref 3.80–5.20)
RDW: 17.7 % — ABNORMAL HIGH (ref 11.5–14.5)
WBC: 5.3 10*3/uL (ref 3.6–11.0)

## 2013-05-23 LAB — COMPREHENSIVE METABOLIC PANEL
Albumin: 2.2 g/dL — ABNORMAL LOW (ref 3.4–5.0)
BUN: 75 mg/dL — ABNORMAL HIGH (ref 7–18)
Bilirubin,Total: 0.8 mg/dL (ref 0.2–1.0)
Calcium, Total: 6.3 mg/dL — CL (ref 8.5–10.1)
Chloride: 100 mmol/L (ref 98–107)
Co2: 23 mmol/L (ref 21–32)
SGPT (ALT): 386 U/L — ABNORMAL HIGH (ref 12–78)
Sodium: 138 mmol/L (ref 136–145)
Total Protein: 5.1 g/dL — ABNORMAL LOW (ref 6.4–8.2)

## 2013-05-24 LAB — BASIC METABOLIC PANEL
BUN: 70 mg/dL — ABNORMAL HIGH (ref 7–18)
Calcium, Total: 6.7 mg/dL — CL (ref 8.5–10.1)
Chloride: 105 mmol/L (ref 98–107)
Co2: 23 mmol/L (ref 21–32)
EGFR (African American): 10 — ABNORMAL LOW
EGFR (Non-African Amer.): 9 — ABNORMAL LOW
Osmolality: 300 (ref 275–301)
Potassium: 3.3 mmol/L — ABNORMAL LOW (ref 3.5–5.1)
Sodium: 140 mmol/L (ref 136–145)

## 2013-05-24 LAB — CBC WITH DIFFERENTIAL/PLATELET
Basophil #: 0 10*3/uL (ref 0.0–0.1)
Basophil %: 0.7 %
Eosinophil #: 0.1 10*3/uL (ref 0.0–0.7)
HCT: 22.1 % — ABNORMAL LOW (ref 35.0–47.0)
HGB: 7.6 g/dL — ABNORMAL LOW (ref 12.0–16.0)
Lymphocyte %: 25.9 %
MCH: 29.9 pg (ref 26.0–34.0)
Monocyte %: 8.1 %
Platelet: 77 10*3/uL — ABNORMAL LOW (ref 150–440)
RBC: 2.55 10*6/uL — ABNORMAL LOW (ref 3.80–5.20)
RDW: 17.8 % — ABNORMAL HIGH (ref 11.5–14.5)

## 2013-05-24 LAB — CULTURE, BLOOD (SINGLE)

## 2013-05-25 LAB — MAGNESIUM: Magnesium: 1.8 mg/dL

## 2013-05-25 LAB — CBC WITH DIFFERENTIAL/PLATELET
Basophil %: 0.4 %
Eosinophil %: 1.1 %
HGB: 7.1 g/dL — ABNORMAL LOW (ref 12.0–16.0)
Lymphocyte %: 22.5 %
MCH: 29.1 pg (ref 26.0–34.0)
MCHC: 32.9 g/dL (ref 32.0–36.0)
MCV: 88 fL (ref 80–100)
Neutrophil #: 3.8 10*3/uL (ref 1.4–6.5)
Neutrophil %: 67.8 %
RDW: 18.2 % — ABNORMAL HIGH (ref 11.5–14.5)
WBC: 5.7 10*3/uL (ref 3.6–11.0)

## 2013-05-25 LAB — RENAL FUNCTION PANEL
Albumin: 2.2 g/dL — ABNORMAL LOW (ref 3.4–5.0)
Anion Gap: 17 — ABNORMAL HIGH (ref 7–16)
BUN: 67 mg/dL — ABNORMAL HIGH (ref 7–18)
Chloride: 106 mmol/L (ref 98–107)
Creatinine: 5.81 mg/dL — ABNORMAL HIGH (ref 0.60–1.30)
EGFR (African American): 11 — ABNORMAL LOW
EGFR (Non-African Amer.): 9 — ABNORMAL LOW
Glucose: 180 mg/dL — ABNORMAL HIGH (ref 65–99)
Phosphorus: 4.5 mg/dL (ref 2.5–4.9)
Sodium: 140 mmol/L (ref 136–145)

## 2013-05-25 LAB — COMPREHENSIVE METABOLIC PANEL
Alkaline Phosphatase: 355 U/L — ABNORMAL HIGH
SGOT(AST): 128 U/L — ABNORMAL HIGH (ref 15–37)
SGPT (ALT): 265 U/L — ABNORMAL HIGH (ref 12–78)
Total Protein: 5.1 g/dL — ABNORMAL LOW (ref 6.4–8.2)

## 2013-05-26 ENCOUNTER — Ambulatory Visit: Payer: Medicare Other | Admitting: Gastroenterology

## 2013-05-26 ENCOUNTER — Ambulatory Visit (HOSPITAL_COMMUNITY)
Admission: AD | Admit: 2013-05-26 | Discharge: 2013-05-26 | Disposition: A | Discharge status: Home / Self Care | Payer: Medicare Other | Source: Other Acute Inpatient Hospital | Attending: Internal Medicine | Admitting: Internal Medicine

## 2013-05-26 DIAGNOSIS — E1010 Type 1 diabetes mellitus with ketoacidosis without coma: Secondary | ICD-10-CM | POA: Insufficient documentation

## 2013-05-26 DIAGNOSIS — R5381 Other malaise: Secondary | ICD-10-CM | POA: Insufficient documentation

## 2013-05-26 LAB — BASIC METABOLIC PANEL
Anion Gap: 16 (ref 7–16)
Calcium, Total: 7.1 mg/dL — ABNORMAL LOW (ref 8.5–10.1)
Co2: 16 mmol/L — ABNORMAL LOW (ref 21–32)
Creatinine: 5.09 mg/dL — ABNORMAL HIGH (ref 0.60–1.30)
EGFR (African American): 13 — ABNORMAL LOW
EGFR (Non-African Amer.): 11 — ABNORMAL LOW
Glucose: 126 mg/dL — ABNORMAL HIGH (ref 65–99)
Osmolality: 301 (ref 275–301)
Potassium: 4 mmol/L (ref 3.5–5.1)

## 2013-07-07 ENCOUNTER — Ambulatory Visit: Payer: Medicare Other | Admitting: Internal Medicine

## 2013-07-22 ENCOUNTER — Encounter: Payer: Self-pay | Admitting: Surgery

## 2013-07-29 ENCOUNTER — Ambulatory Visit: Payer: Medicare Other | Admitting: Internal Medicine

## 2013-08-16 ENCOUNTER — Encounter: Payer: Self-pay | Admitting: Surgery

## 2013-09-03 ENCOUNTER — Emergency Department (HOSPITAL_COMMUNITY): Payer: Medicare Other

## 2013-09-03 ENCOUNTER — Encounter (HOSPITAL_COMMUNITY): Payer: Self-pay | Admitting: Emergency Medicine

## 2013-09-03 ENCOUNTER — Inpatient Hospital Stay (HOSPITAL_COMMUNITY)
Admission: EM | Admit: 2013-09-03 | Discharge: 2013-09-12 | DRG: 870 | Disposition: A | Discharge status: Home Health | Payer: Medicare Other | Attending: Internal Medicine | Admitting: Internal Medicine

## 2013-09-03 DIAGNOSIS — E039 Hypothyroidism, unspecified: Secondary | ICD-10-CM | POA: Diagnosis present

## 2013-09-03 DIAGNOSIS — E860 Dehydration: Secondary | ICD-10-CM

## 2013-09-03 DIAGNOSIS — E872 Acidosis, unspecified: Secondary | ICD-10-CM

## 2013-09-03 DIAGNOSIS — N189 Chronic kidney disease, unspecified: Secondary | ICD-10-CM | POA: Diagnosis present

## 2013-09-03 DIAGNOSIS — R221 Localized swelling, mass and lump, neck: Secondary | ICD-10-CM

## 2013-09-03 DIAGNOSIS — R112 Nausea with vomiting, unspecified: Secondary | ICD-10-CM

## 2013-09-03 DIAGNOSIS — D6489 Other specified anemias: Secondary | ICD-10-CM | POA: Diagnosis present

## 2013-09-03 DIAGNOSIS — E1010 Type 1 diabetes mellitus with ketoacidosis without coma: Secondary | ICD-10-CM | POA: Diagnosis present

## 2013-09-03 DIAGNOSIS — N170 Acute kidney failure with tubular necrosis: Secondary | ICD-10-CM | POA: Diagnosis present

## 2013-09-03 DIAGNOSIS — R7881 Bacteremia: Secondary | ICD-10-CM

## 2013-09-03 DIAGNOSIS — K219 Gastro-esophageal reflux disease without esophagitis: Secondary | ICD-10-CM | POA: Diagnosis present

## 2013-09-03 DIAGNOSIS — R6510 Systemic inflammatory response syndrome (SIRS) of non-infectious origin without acute organ dysfunction: Secondary | ICD-10-CM

## 2013-09-03 DIAGNOSIS — N19 Unspecified kidney failure: Secondary | ICD-10-CM

## 2013-09-03 DIAGNOSIS — R570 Cardiogenic shock: Secondary | ICD-10-CM | POA: Diagnosis present

## 2013-09-03 DIAGNOSIS — G9341 Metabolic encephalopathy: Secondary | ICD-10-CM | POA: Diagnosis present

## 2013-09-03 DIAGNOSIS — E1069 Type 1 diabetes mellitus with other specified complication: Secondary | ICD-10-CM

## 2013-09-03 DIAGNOSIS — D509 Iron deficiency anemia, unspecified: Secondary | ICD-10-CM | POA: Diagnosis present

## 2013-09-03 DIAGNOSIS — I472 Ventricular tachycardia, unspecified: Secondary | ICD-10-CM | POA: Diagnosis present

## 2013-09-03 DIAGNOSIS — R34 Anuria and oliguria: Secondary | ICD-10-CM | POA: Diagnosis present

## 2013-09-03 DIAGNOSIS — R4182 Altered mental status, unspecified: Secondary | ICD-10-CM | POA: Diagnosis present

## 2013-09-03 DIAGNOSIS — R579 Shock, unspecified: Secondary | ICD-10-CM

## 2013-09-03 DIAGNOSIS — N179 Acute kidney failure, unspecified: Secondary | ICD-10-CM

## 2013-09-03 DIAGNOSIS — E1049 Type 1 diabetes mellitus with other diabetic neurological complication: Secondary | ICD-10-CM | POA: Diagnosis present

## 2013-09-03 DIAGNOSIS — N390 Urinary tract infection, site not specified: Secondary | ICD-10-CM

## 2013-09-03 DIAGNOSIS — E875 Hyperkalemia: Secondary | ICD-10-CM | POA: Diagnosis present

## 2013-09-03 DIAGNOSIS — R141 Gas pain: Secondary | ICD-10-CM | POA: Diagnosis present

## 2013-09-03 DIAGNOSIS — J9601 Acute respiratory failure with hypoxia: Secondary | ICD-10-CM

## 2013-09-03 DIAGNOSIS — I959 Hypotension, unspecified: Secondary | ICD-10-CM

## 2013-09-03 DIAGNOSIS — I498 Other specified cardiac arrhythmias: Secondary | ICD-10-CM | POA: Diagnosis present

## 2013-09-03 DIAGNOSIS — N183 Chronic kidney disease, stage 3 unspecified: Secondary | ICD-10-CM | POA: Diagnosis present

## 2013-09-03 DIAGNOSIS — I10 Essential (primary) hypertension: Secondary | ICD-10-CM

## 2013-09-03 DIAGNOSIS — E1143 Type 2 diabetes mellitus with diabetic autonomic (poly)neuropathy: Secondary | ICD-10-CM

## 2013-09-03 DIAGNOSIS — R6520 Severe sepsis without septic shock: Secondary | ICD-10-CM

## 2013-09-03 DIAGNOSIS — J9600 Acute respiratory failure, unspecified whether with hypoxia or hypercapnia: Secondary | ICD-10-CM | POA: Diagnosis present

## 2013-09-03 DIAGNOSIS — I469 Cardiac arrest, cause unspecified: Secondary | ICD-10-CM | POA: Diagnosis not present

## 2013-09-03 DIAGNOSIS — Z823 Family history of stroke: Secondary | ICD-10-CM

## 2013-09-03 DIAGNOSIS — K3184 Gastroparesis: Secondary | ICD-10-CM | POA: Diagnosis present

## 2013-09-03 DIAGNOSIS — D6959 Other secondary thrombocytopenia: Secondary | ICD-10-CM | POA: Diagnosis present

## 2013-09-03 DIAGNOSIS — Z794 Long term (current) use of insulin: Secondary | ICD-10-CM

## 2013-09-03 DIAGNOSIS — I4729 Other ventricular tachycardia: Secondary | ICD-10-CM | POA: Diagnosis present

## 2013-09-03 DIAGNOSIS — E871 Hypo-osmolality and hyponatremia: Secondary | ICD-10-CM | POA: Diagnosis present

## 2013-09-03 DIAGNOSIS — E876 Hypokalemia: Secondary | ICD-10-CM | POA: Diagnosis present

## 2013-09-03 DIAGNOSIS — IMO0002 Reserved for concepts with insufficient information to code with codable children: Secondary | ICD-10-CM | POA: Diagnosis present

## 2013-09-03 DIAGNOSIS — R143 Flatulence: Secondary | ICD-10-CM

## 2013-09-03 DIAGNOSIS — E1065 Type 1 diabetes mellitus with hyperglycemia: Secondary | ICD-10-CM | POA: Diagnosis present

## 2013-09-03 DIAGNOSIS — D638 Anemia in other chronic diseases classified elsewhere: Secondary | ICD-10-CM | POA: Diagnosis present

## 2013-09-03 DIAGNOSIS — E559 Vitamin D deficiency, unspecified: Secondary | ICD-10-CM | POA: Diagnosis present

## 2013-09-03 DIAGNOSIS — I129 Hypertensive chronic kidney disease with stage 1 through stage 4 chronic kidney disease, or unspecified chronic kidney disease: Secondary | ICD-10-CM | POA: Diagnosis present

## 2013-09-03 DIAGNOSIS — A419 Sepsis, unspecified organism: Principal | ICD-10-CM | POA: Diagnosis present

## 2013-09-03 DIAGNOSIS — E1110 Type 2 diabetes mellitus with ketoacidosis without coma: Secondary | ICD-10-CM | POA: Diagnosis present

## 2013-09-03 DIAGNOSIS — Z833 Family history of diabetes mellitus: Secondary | ICD-10-CM

## 2013-09-03 DIAGNOSIS — R142 Eructation: Secondary | ICD-10-CM | POA: Diagnosis present

## 2013-09-03 DIAGNOSIS — R220 Localized swelling, mass and lump, head: Secondary | ICD-10-CM | POA: Diagnosis present

## 2013-09-03 DIAGNOSIS — R6521 Severe sepsis with septic shock: Secondary | ICD-10-CM

## 2013-09-03 DIAGNOSIS — J690 Pneumonitis due to inhalation of food and vomit: Secondary | ICD-10-CM | POA: Diagnosis present

## 2013-09-03 LAB — BASIC METABOLIC PANEL
BUN: 97 mg/dL — ABNORMAL HIGH (ref 6–23)
BUN: 84 mg/dL — ABNORMAL HIGH (ref 6–23)
BUN: 74 mg/dL — AB (ref 6–23)
CALCIUM: 9.1 mg/dL (ref 8.4–10.5)
CALCIUM: 6.8 mg/dL — AB (ref 8.4–10.5)
CALCIUM: 6.9 mg/dL — AB (ref 8.4–10.5)
CO2: <7 mEq/L — CL (ref 19–32)
CREATININE: 3.3 mg/dL — AB (ref 0.50–1.10)
Chloride: 74 mEq/L — ABNORMAL LOW (ref 96–112)
Chloride: 91 mEq/L — ABNORMAL LOW (ref 96–112)
Chloride: 97 mEq/L (ref 96–112)
Creatinine, Ser: 4.62 mg/dL — ABNORMAL HIGH (ref 0.50–1.10)
Creatinine, Ser: 3.88 mg/dL — ABNORMAL HIGH (ref 0.50–1.10)
GFR calc Af Amer: 14 mL/min — ABNORMAL LOW (ref >90)
GFR calc Af Amer: 17 mL/min — ABNORMAL LOW (ref >90)
GFR calc Af Amer: 21 mL/min — ABNORMAL LOW (ref >90)
GFR calc non Af Amer: 18 mL/min — ABNORMAL LOW (ref >90)
GFR, EST NON AFRICAN AMERICAN: 12 mL/min — AB (ref >90)
GFR, EST NON AFRICAN AMERICAN: 15 mL/min — AB (ref >90)
GLUCOSE: 1594 mg/dL — AB (ref 70–99)
Glucose, Bld: 1226 mg/dL (ref 70–99)
Glucose, Bld: 983 mg/dL (ref 70–99)
Potassium: 7 mEq/L (ref 3.7–5.3)
Potassium: 5.5 mEq/L — ABNORMAL HIGH (ref 3.7–5.3)
Potassium: 4.5 mEq/L (ref 3.7–5.3)
SODIUM: 120 meq/L — AB (ref 137–147)
SODIUM: 133 meq/L — AB (ref 137–147)
Sodium: 136 mEq/L — ABNORMAL LOW (ref 137–147)

## 2013-09-03 LAB — CBG MONITORING, ED: Glucose-Capillary: >600 mg/dL (ref 70–99)

## 2013-09-03 LAB — BLOOD GAS, ARTERIAL
ACID-BASE DEFICIT: 0 mmol/L (ref 0.0–2.0)
ACID-BASE DEFICIT: 29.9 mmol/L — AB (ref 0.0–2.0)
ACID-BASE EXCESS: 0 mmol/L (ref 0.0–2.0)
ACID-BASE EXCESS: 0 mmol/L (ref 0.0–2.0)
Acid-base deficit: 0 mmol/L (ref 0.0–2.0)
BICARBONATE: 2.5 meq/L — AB (ref 20.0–24.0)
Bicarbonate: 0 mEq/L — ABNORMAL LOW (ref 20.0–24.0)
Bicarbonate: 0 mEq/L — ABNORMAL LOW (ref 20.0–24.0)
Drawn by: 308601
Drawn by: 308601
FIO2: 0.21 %
FIO2: 1 %
FIO2: 1 %
MECHVT: 450 mL
O2 SAT: 93.5 %
O2 Saturation: 97.2 %
O2 Saturation: 97.7 %
PATIENT TEMPERATURE: 98.6
PATIENT TEMPERATURE: 86.4
PCO2 ART: 8.4 mmHg — AB (ref 35.0–45.0)
PEEP: 5 cmH2O
PEEP: 5 cmH2O
PO2 ART: 272 mmHg — AB (ref 80.0–100.0)
PO2 ART: 258 mmHg — AB (ref 80.0–100.0)
PRESSURE SUPPORT: 15 cmH2O
Patient temperature: 85.3
RATE: 24 resp/min
TCO2: 0 mmol/L (ref 0–100)
TCO2: 0 mmol/L (ref 0–100)
TCO2: 2.8 mmol/L (ref 0–100)
pCO2 arterial: 12.6 mmHg — CL (ref 35.0–45.0)
pCO2 arterial: 12.2 mmHg — CL (ref 35.0–45.0)
pH, Arterial: 0 — CL (ref 7.350–7.450)
pH, Arterial: 0 — CL (ref 7.350–7.450)
pH, Arterial: 6.868 — CL (ref 7.350–7.450)
pO2, Arterial: 175 mmHg — ABNORMAL HIGH (ref 80.0–100.0)

## 2013-09-03 LAB — CBC WITH DIFFERENTIAL/PLATELET
BASOS ABS: 0 10*3/uL (ref 0.0–0.1)
Basophils Relative: 0 % (ref 0–1)
EOS ABS: 0 10*3/uL (ref 0.0–0.7)
Eosinophils Relative: 0 % (ref 0–5)
HCT: 37.1 % (ref 36.0–46.0)
Hemoglobin: 10.1 g/dL — ABNORMAL LOW (ref 12.0–15.0)
LYMPHS PCT: 30 % (ref 12–46)
Lymphs Abs: 6.3 10*3/uL — ABNORMAL HIGH (ref 0.7–4.0)
MCH: 31.4 pg (ref 26.0–34.0)
MCHC: 27.2 g/dL — AB (ref 30.0–36.0)
MCV: 115.2 fL — ABNORMAL HIGH (ref 78.0–100.0)
Monocytes Absolute: 1.7 10*3/uL — ABNORMAL HIGH (ref 0.1–1.0)
Monocytes Relative: 8 % (ref 3–12)
Neutro Abs: 13 10*3/uL — ABNORMAL HIGH (ref 1.7–7.7)
Neutrophils Relative %: 62 % (ref 43–77)
Platelets: 378 10*3/uL (ref 150–400)
RBC: 3.22 MIL/uL — ABNORMAL LOW (ref 3.87–5.11)
RDW: 14.2 % (ref 11.5–15.5)
WBC: 21 10*3/uL — ABNORMAL HIGH (ref 4.0–10.5)

## 2013-09-03 LAB — URINE MICROSCOPIC-ADD ON

## 2013-09-03 LAB — CBC
HCT: 27.3 % — ABNORMAL LOW (ref 36.0–46.0)
Hemoglobin: 8.2 g/dL — ABNORMAL LOW (ref 12.0–15.0)
MCH: 31.9 pg (ref 26.0–34.0)
MCHC: 30 g/dL (ref 30.0–36.0)
MCV: 106.2 fL — ABNORMAL HIGH (ref 78.0–100.0)
PLATELETS: 222 10*3/uL (ref 150–400)
RBC: 2.57 MIL/uL — ABNORMAL LOW (ref 3.87–5.11)
RDW: 14.2 % (ref 11.5–15.5)
WBC: 9.8 10*3/uL (ref 4.0–10.5)

## 2013-09-03 LAB — URINALYSIS, ROUTINE W REFLEX MICROSCOPIC
Bilirubin Urine: NEGATIVE
Glucose, UA: >1000 mg/dL — AB
HGB URINE DIPSTICK: NEGATIVE
KETONES UR: NEGATIVE mg/dL
Leukocytes, UA: NEGATIVE
Nitrite: NEGATIVE
PROTEIN: 30 mg/dL — AB
Specific Gravity, Urine: 1.022 (ref 1.005–1.030)
Urobilinogen, UA: 0.2 mg/dL (ref 0.0–1.0)
pH: 5 (ref 5.0–8.0)

## 2013-09-03 LAB — RAPID URINE DRUG SCREEN, HOSP PERFORMED
Amphetamines: NOT DETECTED
Barbiturates: NOT DETECTED
Benzodiazepines: NOT DETECTED
Cocaine: NOT DETECTED
OPIATES: NOT DETECTED
Tetrahydrocannabinol: NOT DETECTED

## 2013-09-03 LAB — KETONES, QUALITATIVE

## 2013-09-03 LAB — PROCALCITONIN: Procalcitonin: 5.6 ng/mL

## 2013-09-03 LAB — POC URINE PREG, ED: Preg Test, Ur: NEGATIVE

## 2013-09-03 LAB — HEPATIC FUNCTION PANEL
ALK PHOS: 118 U/L — AB (ref 39–117)
ALT: 93 U/L — ABNORMAL HIGH (ref 0–35)
AST: 193 U/L — ABNORMAL HIGH (ref 0–37)
Albumin: 3.4 g/dL — ABNORMAL LOW (ref 3.5–5.2)
BILIRUBIN DIRECT: 0.3 mg/dL (ref 0.0–0.3)
BILIRUBIN INDIRECT: 0.1 mg/dL — AB (ref 0.3–0.9)
Total Bilirubin: 0.4 mg/dL (ref 0.3–1.2)
Total Protein: 6.8 g/dL (ref 6.0–8.3)

## 2013-09-03 LAB — I-STAT CG4 LACTIC ACID, ED: Lactic Acid, Venous: 11.22 mmol/L — ABNORMAL HIGH (ref 0.5–2.2)

## 2013-09-03 LAB — MAGNESIUM: Magnesium: 2.6 mg/dL — ABNORMAL HIGH (ref 1.5–2.5)

## 2013-09-03 MED ORDER — FENTANYL CITRATE 0.05 MG/ML IJ SOLN
25.0000 ug | INTRAMUSCULAR | Status: DC | PRN
  Administered 2013-09-08: 50 ug via INTRAVENOUS

## 2013-09-03 MED ORDER — SODIUM CHLORIDE 0.9 % IV BOLUS (SEPSIS)
1000.0000 mL | Freq: Once | INTRAVENOUS | Status: AC
  Administered 2013-09-03: 1000 mL via INTRAVENOUS

## 2013-09-03 MED ORDER — ALBUTEROL SULFATE (2.5 MG/3ML) 0.083% IN NEBU: 2.5000 mg | INHALATION_SOLUTION | RESPIRATORY_TRACT | Status: DC | PRN

## 2013-09-03 MED ORDER — PANTOPRAZOLE SODIUM 40 MG IV SOLR
40.0000 mg | Freq: Every day | INTRAVENOUS | Status: DC
  Administered 2013-09-04 – 2013-09-07 (×5): 40 mg via INTRAVENOUS
  Filled 2013-09-03 (×7): qty 40

## 2013-09-03 MED ORDER — LIDOCAINE HCL (CARDIAC) 20 MG/ML IV SOLN
INTRAVENOUS | Status: AC
  Filled 2013-09-03: qty 5

## 2013-09-03 MED ORDER — DEXTROSE-NACL 5-0.45 % IV SOLN: INTRAVENOUS | Status: DC

## 2013-09-03 MED ORDER — SODIUM CHLORIDE 0.9 % IV SOLN
INTRAVENOUS | Status: DC
  Administered 2013-09-03: 23:00:00 via INTRAVENOUS

## 2013-09-03 MED ORDER — NOREPINEPHRINE BITARTRATE 1 MG/ML IJ SOLN
2.0000 ug/min | INTRAVENOUS | Status: DC
  Administered 2013-09-03: 12 ug/min via INTRAVENOUS
  Administered 2013-09-04 (×2): 14 ug/min via INTRAVENOUS
  Administered 2013-09-05: 7 ug/min via INTRAVENOUS
  Administered 2013-09-05: 5 ug/min via INTRAVENOUS
  Administered 2013-09-05: 8 ug/min via INTRAVENOUS
  Filled 2013-09-03 (×7): qty 4

## 2013-09-03 MED ORDER — SODIUM CHLORIDE 0.9 % IV SOLN: INTRAVENOUS | Status: AC

## 2013-09-03 MED ORDER — SUCCINYLCHOLINE CHLORIDE 20 MG/ML IJ SOLN
INTRAMUSCULAR | Status: AC
  Filled 2013-09-03: qty 1

## 2013-09-03 MED ORDER — SODIUM CHLORIDE 0.9 % IV SOLN
INTRAVENOUS | Status: DC
  Administered 2013-09-04: 46.1 [IU]/h via INTRAVENOUS
  Administered 2013-09-04: 47.4 [IU]/h via INTRAVENOUS
  Administered 2013-09-04: 07:00:00 via INTRAVENOUS
  Administered 2013-09-04: 43.3 [IU]/h via INTRAVENOUS
  Filled 2013-09-03 (×6): qty 1

## 2013-09-03 MED ORDER — ETOMIDATE 2 MG/ML IV SOLN
INTRAVENOUS | Status: AC
  Administered 2013-09-03: 20 mg
  Filled 2013-09-03: qty 20

## 2013-09-03 MED ORDER — BIOTENE DRY MOUTH MT LIQD
15.0000 mL | Freq: Four times a day (QID) | OROMUCOSAL | Status: DC
  Administered 2013-09-04 – 2013-09-11 (×22): 15 mL via OROMUCOSAL

## 2013-09-03 MED ORDER — HEPARIN SODIUM (PORCINE) 5000 UNIT/ML IJ SOLN
5000.0000 [IU] | Freq: Three times a day (TID) | INTRAMUSCULAR | Status: DC
  Administered 2013-09-04 – 2013-09-06 (×7): 5000 [IU] via SUBCUTANEOUS
  Filled 2013-09-03 (×11): qty 1

## 2013-09-03 MED ORDER — LIP MEDEX EX OINT
TOPICAL_OINTMENT | CUTANEOUS | Status: DC | PRN
  Filled 2013-09-03: qty 7

## 2013-09-03 MED ORDER — INSULIN REGULAR HUMAN 100 UNIT/ML IJ SOLN
INTRAMUSCULAR | Status: DC
  Administered 2013-09-03: 5.4 [IU]/h via INTRAVENOUS
  Filled 2013-09-03: qty 1

## 2013-09-03 MED ORDER — ROCURONIUM BROMIDE 50 MG/5ML IV SOLN
INTRAVENOUS | Status: AC
  Filled 2013-09-03: qty 2

## 2013-09-03 MED ORDER — ONDANSETRON HCL 4 MG/2ML IJ SOLN
INTRAMUSCULAR | Status: AC
Start: 2013-09-03 — End: 2013-09-03
  Administered 2013-09-03: 4 mg
  Filled 2013-09-03: qty 2

## 2013-09-03 MED ORDER — SODIUM BICARBONATE 8.4 % IV SOLN
INTRAVENOUS | Status: DC
  Administered 2013-09-03 – 2013-09-04 (×4): via INTRAVENOUS
  Filled 2013-09-03 (×11): qty 850

## 2013-09-03 MED ORDER — DOPAMINE-DEXTROSE 3.2-5 MG/ML-% IV SOLN: 2.0000 ug/kg/min | INTRAVENOUS | Status: DC

## 2013-09-03 MED ORDER — DEXTROSE 50 % IV SOLN
25.0000 mL | INTRAVENOUS | Status: DC | PRN
  Administered 2013-09-04: 14 mL via INTRAVENOUS
  Administered 2013-09-04: 16 mL via INTRAVENOUS
  Administered 2013-09-06: 13 mL via INTRAVENOUS
  Administered 2013-09-06: 19 mL via INTRAVENOUS
  Administered 2013-09-07: 25 mL via INTRAVENOUS
  Administered 2013-09-07: 12 mL via INTRAVENOUS
  Filled 2013-09-03 (×3): qty 50

## 2013-09-03 MED ORDER — FENTANYL CITRATE 0.05 MG/ML IJ SOLN
25.0000 ug/h | INTRAMUSCULAR | Status: DC
  Administered 2013-09-03: 50 ug/h via INTRAVENOUS
  Administered 2013-09-04: 175 ug/h via INTRAVENOUS
  Administered 2013-09-04: 150 ug/h via INTRAVENOUS
  Administered 2013-09-04: 100 ug/h via INTRAVENOUS
  Administered 2013-09-05 – 2013-09-07 (×2): 75 ug/h via INTRAVENOUS
  Filled 2013-09-03 (×5): qty 50

## 2013-09-03 MED ORDER — PROPOFOL 10 MG/ML IV EMUL
5.0000 ug/kg/min | INTRAVENOUS | Status: DC
  Administered 2013-09-03: 10 ug/kg/min via INTRAVENOUS
  Administered 2013-09-04: 40 ug/kg/min via INTRAVENOUS
  Administered 2013-09-04: 60 ug/kg/min via INTRAVENOUS
  Administered 2013-09-04: 70 ug/kg/min via INTRAVENOUS
  Administered 2013-09-04: 60 ug/kg/min via INTRAVENOUS
  Administered 2013-09-04: 70 ug/kg/min via INTRAVENOUS
  Administered 2013-09-04 – 2013-09-05 (×3): 40 ug/kg/min via INTRAVENOUS
  Administered 2013-09-05 – 2013-09-06 (×2): 20 ug/kg/min via INTRAVENOUS
  Administered 2013-09-07: 5 ug/kg/min via INTRAVENOUS
  Filled 2013-09-03 (×10): qty 100

## 2013-09-03 MED ORDER — DEXTROSE 5 % IV SOLN
1.0000 g | Freq: Once | INTRAVENOUS | Status: AC
  Administered 2013-09-03: 1 g via INTRAVENOUS
  Filled 2013-09-03: qty 10

## 2013-09-03 MED ORDER — SODIUM BICARBONATE 8.4 % IV SOLN
INTRAVENOUS | Status: DC
  Administered 2013-09-03: 19:00:00 via INTRAVENOUS
  Filled 2013-09-03 (×2): qty 850

## 2013-09-03 MED ORDER — CHLORHEXIDINE GLUCONATE 0.12 % MT SOLN
15.0000 mL | Freq: Two times a day (BID) | OROMUCOSAL | Status: DC
  Administered 2013-09-04 – 2013-09-08 (×9): 15 mL via OROMUCOSAL
  Filled 2013-09-03 (×7): qty 15

## 2013-09-03 NOTE — ED Notes (Signed)
bair hugger applied.

## 2013-09-03 NOTE — ED Notes (Signed)
2L ns bolus infusing

## 2013-09-03 NOTE — ED Notes (Signed)
Per EMS, Pt, from home, presents unresponsive with CBG>600.  Pt was found earlier today lying in bed.  No other information is known.

## 2013-09-03 NOTE — ED Notes (Signed)
Critical Labs Na-120 K-7.0 CO2<7 Glucose 1594  RN and MD notified

## 2013-09-03 NOTE — ED Notes (Signed)
Fluid warmer started

## 2013-09-03 NOTE — ED Notes (Signed)
Bed: WA07 Expected date:  Expected time:  Means of arrival:  Comments: ems hyperglycemia, unresponsive

## 2013-09-03 NOTE — ED Notes (Signed)
Dopamine drip increased to 5820mcg/kg/min

## 2013-09-03 NOTE — ED Notes (Signed)
Dopamine drip decreased to 305mcg/kg/min

## 2013-09-03 NOTE — ED Notes (Signed)
20 mg etomidate given verbal order MD.

## 2013-09-03 NOTE — Progress Notes (Signed)
eLink Physician-Brief Progress Note Patient Name: [name] Ross DOB: 07/31/1985 MRN: [id number]  Date of Service  09/03/2013   HPI/Events of Note   Apnea with low Ve on PS Asynchronous on PRVC and PC   eICU Interventions   Change to PC 20 f 30 Start Propofol / Fentanyl gtt ABG in 1 hour after properly sedated BMP, Lactate, Troponin at midnight     Intervention Category Major Interventions: Respiratory failure - evaluation and management;Acid-Base disturbance - evaluation and management  [name] Ross 09/03/2013, 11:30 PM

## 2013-09-03 NOTE — H&P (Addendum)
Name: [name] Ross MRN: [id number] DOB: 10/07/1985    ADMISSION DATE:  09/03/2013 CONSULTATION DATE:  3/19   REFERRING MD :  Jodi Mourning  PRIMARY SERVICE: pccm   CHIEF COMPLAINT:  dka and shock   BRIEF PATIENT DESCRIPTION:  28 year old diabetic female. Found unresponsive on 3/19. Initial glucose 1594, WBC 21, PH un-measurable, +serum Ketones, lactic acid >11 w/  early rll infiltrate. PCCM asked to admit for DKA and possible septic shock.   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 Found unresponsive on 3/19. Initial glucose 1594, WBC 21, PH un-measurable, +serum Ketones, lactic acid >11 w/  early rll infiltrate. BP on arrival 60s, got total of 11 liters of IVFs, started on bicarb gtt for profound acidosis and hyperkalemia. Intubated by EDP for progressive resp failure. Dopamine and levophed gtt started. PCCM asked to admit for DKA and possible septic shock.   PAST MEDICAL HISTORY :  Past Medical History  Diagnosis Date  . Diabetes mellitus   . Hypothyroidism   . Anemia   . Vitamin D deficiency    Past Surgical History  Procedure Laterality Date  . Induced abortion     Prior to Admission medications   Medication Sig Start Date End Date Taking? Authorizing Provider  insulin aspart (NOVOLOG) 100 UNIT/ML injection Inject 0-15 units into the skin 3 times daily before meals per sliding scale.  CBG < 70: drink juice and or milk and call physician or 911    CBG 70 - 120: 0 units      CBG > 400 call MD and obtain STAT lab verification      CBG 121 - 150: 2 units      CBG 151 - 200: 3 units      CBG 201 - 250: 5 units      CBG 251 - 300: 8 units      CBG 301 - 350: 11 units      CBG 351 - 400: 15 units 10/23/11   Penny Pia, MD  insulin detemir (LEVEMIR) 100 unit/ml SOLN Inject 8-10 Units into the skin 2 (two) times daily. 10 units in the am & 8 units in the pm    Historical Provider, MD  metoCLOPramide (REGLAN) 5 MG/5ML solution Take 5 mLs (5 mg total) by mouth 4 (four) times daily -  before meals and at bedtime. 04/11/13   Shanker Levora Dredge, MD  pantoprazole (PROTONIX) 40 MG tablet Take 1 tablet (40 mg total) by mouth daily at 12 noon. 04/11/13   Shanker Levora Dredge, MD  sodium bicarbonate 650 MG tablet Take 1 tablet (650 mg total) by mouth 2 (two) times daily. 04/11/13   Shanker Levora Dredge, MD   No Known Allergies  FAMILY HISTORY:  Family History  Problem Relation Age of Onset  . Osteoarthritis Maternal Grandmother   . Osteopenia Maternal Grandmother   . Diabetes Mellitus II Maternal Grandfather   . Prostate cancer Maternal Grandfather   . CVA Maternal Grandfather    SOCIAL HISTORY:  reports that she has never smoked. She has never used smokeless tobacco. She reports that she does not drink alcohol or use illicit drugs.  REVIEW OF SYSTEMS:  Unable   SUBJECTIVE:  Unresponsive and  in shock  VITAL SIGNS: Pulse Rate:  [48-113] 58 (03/19 2045) Resp:  [12-34] 12 (03/19 2045) BP: (65-87)/(35-46) 79/43 mmHg (03/19 2045) SpO2:  [95 %-100 %] 95 % (03/19 2045) FiO2 (%):  [50 %-100 %] 100 % (03/19 2030) HEMODYNAMICS:   VENTILATOR SETTINGS: Vent Mode:  [-] PRVC FiO2 (%):  [50 %-100 %] 100 % Set Rate:  [24 bmp-35 bmp] 35 bmp Vt Set:  [450 mL-500 mL] 500 mL PEEP:  [5 cmH20] 5 cmH20 Plateau Pressure:  [11 cmH20-21 cmH20] 11 cmH20 INTAKE / OUTPUT: Intake/Output   None     PHYSICAL EXAMINATION: General:  Acutely and critically ill. Unresponsive on vent.  Neuro:  GCS 4, still Kussmaul resp efforts on full vent support  HEENT:  Orally intubated. Some tongue swelling.  Cardiovascular:  rrr Lungs:  Scattered rhonchi  Abdomen:  Soft, no OM Musculoskeletal:  Intact  Skin:  Intact    LABS:  CBC  Recent Labs Lab 09/03/13 1820  WBC 21.0*  HGB 10.1*  HCT 37.1  PLT 378   Coag's No results found for this basename: APTT, INR,  in the last 168 hours BMET  Recent Labs Lab 09/03/13 1820 09/03/13 2003  NA 120* 133*  K 7.0* 5.5*  CL 74* 91*  CO2 <7* <7*  BUN 97* 84*  CREATININE 4.62* 3.88*  GLUCOSE 1594* 1226*   Electrolytes  Recent Labs Lab 09/03/13 1820 09/03/13 2003  CALCIUM 9.1 6.8*  MG  --  2.6*   Sepsis Markers  Recent Labs Lab 09/03/13 1836  LATICACIDVEN 11.22*   ABG  Recent Labs Lab 09/03/13 1813 09/03/13 1952  PHART 0.000* 0.000*  PCO2ART 12.6* 8.4*  PO2ART 175.0* 272.0*   Liver Enzymes  Recent Labs Lab 09/03/13 1820  AST 193*  ALT 93*  ALKPHOS 118*  BILITOT 0.4  ALBUMIN 3.4*   Cardiac Enzymes No results found for this basename: TROPONINI, PROBNP,  in the last 168 hours Glucose  Recent Labs Lab 09/03/13 1805 09/03/13 2041  GLUCAP >600* >600*    Imaging Ct Head Wo Contrast  09/03/2013   CLINICAL DATA:  Altered mental status.  EXAM: CT HEAD WITHOUT CONTRAST  TECHNIQUE: Contiguous axial images were obtained from the base of the skull through the vertex without intravenous contrast.  COMPARISON:  None.  FINDINGS: The ventricles are normal in size and configuration. No extra-axial fluid collections are identified. The gray-white differentiation is normal. No CT findings for acute intracranial process such as hemorrhage or infarction. No mass lesions. The brainstem and cerebellum are grossly normal.The bony structures are intact. The paranasal sinuses and mastoid air cells are clear. The globes are intact.  IMPRESSION: No acute intracranial findings.   Electronically Signed   By: Loralie ChampagneMark  Gallerani M.D.   On: 09/03/2013 19:14   Dg Chest Portable 1 View  09/03/2013   CLINICAL DATA:  Endotracheal and NG tube placement.  EXAM: PORTABLE CHEST - 1 VIEW  COMPARISON:  DG CHEST 1V PORT dated 09/03/2013  FINDINGS: Endotracheal tube  tip noted 1.1 cm above the tip of the carina, mild proximal repositioning should be considered. NG tube noted with tip below left hemidiaphragm. Poor inspiration. No focal pulmonary infiltrate. No pleural effusion or pneumothorax. Heart size normal. Left posterior lateral sixth rib fracture present.  IMPRESSION: 1. Endotracheal tube with tip approximately 1.1 cm above the carina. Mild proximal reposition should be considered. 2. NG tube noted with tip below left hemidiaphragm. 3. Nondisplaced left posterior lateral sixth rib fracture. 4. No acute cardiopulmonary  disease.   Electronically Signed   By: Maisie Fus  Register   On: 09/03/2013 19:57   Dg Chest Port 1 View  09/03/2013   CLINICAL DATA:  Altered mental status  EXAM: PORTABLE CHEST - 1 VIEW  COMPARISON:  10/18/2011  FINDINGS: No cardiomegaly for technique. No infiltrate or edema suspected. No effusion or pneumothorax. There is a lateral left 6 rib fracture. There is likely associated callus, compatible with remote injury. Chronic 4 mm metallic body associated with the upper right chest.  IMPRESSION: 1.  No active disease. 2. Non acute lateral left sixth rib fracture.   Electronically Signed   By: Tiburcio Pea M.D.   On: 09/03/2013 19:29     CXR:  ett good position.   ASSESSMENT / PLAN:  PULMONARY A: acute respiratory failure in setting of ams and profound acidosis  On PS ventilation P:   Optimize Ve for now looks more comfortable w/ more consistent Ve on PS of 15.  Hold sedation for now.. She needs to maximize her Ve, as she gets more awake we will likely need to add sedating gtt and place on full support mode Close obs of abg Sputum ck  Hope to extubate in am once metabolic encephalopathy improved  CARDIOVASCULAR A:  Circulatory shock: sepsis vs cardiogenic dysfxn in setting of profound acidosis       Bradycardia in setting of acidosis and hyperkalemia  P:  Cont aggressive volume resuscitation efforts Optimize Ve w/ vent and cont  bicarb gtt Will try dopamine, but given profound acidosis may not get much effect from this. --->Addedlevophed --> rapidly titrate once Bp improves (expect as acidosis improves) See ID section  Ck cortisol   RENAL A:  Severe DKA      Severe + AG metabolic acidosis: ketoacidosis, lactic acidosis and renal failure. Now s/p 8 liters of crystalloid       Acute renal failure -baseline cr 1.3 03/2013      Hyperkalemia  P:   Maximize Ve Bicarb gtt - dc once pH > 7.1 Close obs of abg and chemistry --> expect K to drop as acidosis corrects strick I&O Dopamine for goal MAP >65  GASTROINTESTINAL A:  Diabetic gastroparesis  P:   ppi   HEMATOLOGIC A:  Anemia  P:  [place] heparin  F/u cbc   INFECTIOUS A:  SIRS, r/o septic shock. CXR looks like early rll infiltrate  P:   Pan culture  Add empiric levaquin and rocephin.   ENDOCRINE A:  DKA      DM w/ Hyperglycemia  P:   dka protocol   NEUROLOGIC A: acute encephalopathy in setting of DKA Starting to wake up in ER  P:   Supportive care  Fent prn  TODAY'S SUMMARY: admitted w/ DKA and MODS. Suspect CAP was initiating factor. Seems to be responding to current aggressive resuscitation efforts. Monitor chemistry q 2h , then back off to q 4h once pH7.1 & higher  I have personally obtained a history, examined the patient, evaluated laboratory and imaging results, formulated the assessment and plan and placed orders. CRITICAL CARE: The patient is critically ill with multiple organ systems failure and requires high complexity decision making for assessment and support, frequent evaluation and titration of therapies, application of advanced monitoring technologies and extensive interpretation of multiple databases. Critical Care Time devoted to patient care services described in this note is 60  minutes.   Oretha Milch  Pulmonary and Critical Care Medicine [facility] Pager: [phone]  09/03/2013, 9:07 PM

## 2013-09-03 NOTE — ED Notes (Signed)
10 units insulin bolused per carelink. 1 amp calcium given per verbal order carelink MD

## 2013-09-03 NOTE — ED Notes (Signed)
Dr Jodi MourningZavitz aware of critical I stat lactic acid

## 2013-09-03 NOTE — ED Notes (Signed)
Insulin drip increased to a rate of 23.3 per glucostabilizer orders  Dopamine drip decreased to 3710mcg/kg/min

## 2013-09-03 NOTE — ED Notes (Signed)
MD at bedside placing femoral line

## 2013-09-03 NOTE — ED Notes (Signed)
MD intubating. Cords visualized, color change verified.

## 2013-09-03 NOTE — ED Notes (Signed)
5th bolus infusing

## 2013-09-03 NOTE — Progress Notes (Signed)
Pt transported to ICU on VENT without complications.  RT to monitor and assess as needed.

## 2013-09-03 NOTE — ED Provider Notes (Signed)
CSN: [id number]     Arrival date & time 09/03/13  1802 History   First MD Initiated Contact with Patient 09/03/13 1820     Chief Complaint  Patient presents with  . Hyperglycemia     (Consider location/radiation/quality/duration/timing/severity/associated sxs/prior Treatment) HPI Comments: 28 yo female with DKA, DM 1, ARF, gastroparesis hx presents unresponsive with Kussmaul breathing since unknown time period.  Pt was found unresponsive by unknown person PTA.  Unknown down time.  Insulin near her body, no other drugs seen.  Pt non verbal.  EMS unable to obtain IV.    Patient is a 28 y.o. female presenting with hyperglycemia. The history is provided by the EMS personnel and medical records.  Hyperglycemia   Past Medical History  Diagnosis Date  . Diabetes mellitus   . Hypothyroidism   . Anemia   . Vitamin D deficiency    Past Surgical History  Procedure Laterality Date  . Induced abortion     Family History  Problem Relation Age of Onset  . Osteoarthritis Maternal Grandmother   . Osteopenia Maternal Grandmother   . Diabetes Mellitus II Maternal Grandfather   . Prostate cancer Maternal Grandfather   . CVA Maternal Grandfather    History  Substance Use Topics  . Smoking status: Never Smoker   . Smokeless tobacco: Never Used  . Alcohol Use: No   OB History   Grav Para Term Preterm Abortions TAB SAB Ect Mult Living                 Review of Systems  Unable to perform ROS: Patient unresponsive      Allergies  Review of patient's allergies indicates no known allergies.  Home Medications   Current Outpatient Rx  Name  Route  Sig  Dispense  Refill  . amLODipine (NORVASC) 5 MG tablet   Oral   Take 5 mg by mouth daily.         . mirtazapine (REMERON) 15 MG tablet   Oral   Take 15 mg by mouth at bedtime.         . insulin aspart (NOVOLOG) 100 UNIT/ML injection   Subcutaneous   Inject 2 Units into the skin See admin instructions. Inject 2 units twice  daily( before breakfast and dinner) and also use according to sliding scale Inject 0-15 units into the skin 3 times daily before meals per sliding scale.  CBG < 70: drink juice and or milk and call physician or 911    CBG 70 - 120: 0 units      CBG > 400 call MD and obtain STAT lab verification      CBG 121 - 150: 2 units      CBG 151 - 200: 3 units      CBG 201 - 250: 5 units      CBG 251 - 300: 8 units      CBG 301 - 350: 11 units      CBG 351 - 400: 15 units         . insulin detemir (LEVEMIR) 100 unit/ml SOLN   Subcutaneous   Inject 11 Units into the skin daily.          . metoCLOPramide (REGLAN) 5 MG/5ML solution   Oral   Take 5 mLs (5 mg total) by mouth 4 (four) times daily -  before meals and at bedtime.   120 mL   0   . pantoprazole (PROTONIX) 40 MG tablet   Oral  Take 1 tablet (40 mg total) by mouth daily at 12 noon.   30 tablet   0   . sodium bicarbonate 650 MG tablet   Oral   Take 1 tablet (650 mg total) by mouth 2 (two) times daily.   30 tablet   0    BP 74/39  Pulse 49  Resp 12  Ht 5\' 5"  (1.651 m)  SpO2 100% Physical Exam  Nursing note and vitals reviewed. Constitutional: She appears well-developed. She appears distressed.  HENT:  Head: Normocephalic and atraumatic.  Mild swollen lower face and tongue without induration or warmth, neck supple, severe dry mm  Eyes: Right eye exhibits no discharge. Left eye exhibits no discharge. No scleral icterus.  Sluggish pupils, equal bilateral  Neck: Neck supple.  Cardiovascular: Tachycardia present.   Pulses:      Femoral pulses are 1+ on the right side, and 1+ on the left side. Pulmonary/Chest: She is in respiratory distress. She has rales (few at bases).  Kussmaul breathing tachypnea  Abdominal: She exhibits no distension. There is no guarding.  Musculoskeletal: Edema: no leg edema.  Lymphadenopathy:    She has no cervical adenopathy.  Neurological: GCS eye subscore is 2. GCS verbal subscore is 1.  GCS motor subscore is 3.  Responds with non focal movements to pain  Skin: No rash noted. No erythema.  Psychiatric:  Non verbal    ED Course  CENTRAL LINE Date/Time: 09/03/2013 8:51 PM Performed by: Enid SkeensZAVITZ, Delwyn Scoggin M Authorized by: Enid SkeensZAVITZ, Bailey Kolbe M Consent: The procedure was performed in an emergent situation. Patient identity confirmed: arm band Indications: vascular access Preparation: skin prepped with 2% chlorhexidine Skin prep agent dried: skin prep agent completely dried prior to procedure Hand hygiene: hand hygiene performed prior to central venous catheter insertion Location details: right femoral Patient position: flat Catheter type: triple lumen Catheter size: 7 Fr Ultrasound guidance: yes Number of attempts: 1 Successful placement: yes Post-procedure: line sutured and dressing applied Assessment: blood return through all ports   (including critical care time) Emergency Ultrasound Study:   Angiocath insertion Performed by: Enid SkeensZAVITZ, Lauriann Milillo M  Consent: Verbal consent obtained. Risks and benefits: risks, benefits and alternatives were discussed Immediately prior to procedure the correct patient, procedure, equipment, support staff and site/side marked as needed.  Indication: difficult IV access Preparation: Patient was prepped and draped in the usual sterile fashion. Vein Location: left AC vein was visualized during assessment for potential access sites and was found to be patent/ easily compressed with linear ultrasound.  The needle was visualized with real-time ultrasound and guided into the vein. Gauge: 18  Image saved and stored.  Normal blood return.  Patient tolerance: Patient tolerated the procedure well with no immediate complications.  Emergency Ultrasound Study:   Angiocath insertion Performed by: Enid SkeensZAVITZ, Joneric Streight M  Consent: Verbal consent obtained. Risks and benefits: risks, benefits and alternatives were discussed Immediately prior to procedure the  correct patient, procedure, equipment, support staff and site/side marked as needed.  Indication: difficult IV access Preparation: Patient was prepped and draped in the usual sterile fashion. Vein Location: right AC vein was visualized during assessment for potential access sites and was found to be patent/ easily compressed with linear ultrasound.  The needle was visualized with real-time ultrasound and guided into the vein. Gauge: 18  Image saved and stored.  Normal blood return.  Patient tolerance: Patient tolerated the procedure well with no immediate complications.  Emergency Ultrasound Study:   Angiocath insertion Performed by: Blane OharaZAVITZ, Amoni Scallan  M  Consent: Verbal consent obtained. Risks and benefits: risks, benefits and alternatives were discussed Immediately prior to procedure the correct patient, procedure, equipment, support staff and site/side marked as needed.  Indication: difficult IV access Preparation: Patient was prepped and draped in the usual sterile fashion. Vein Location: right femoral vein was visualized during assessment for potential access sites and was found to be patent/ easily compressed with linear ultrasound.  The needle was visualized with real-time ultrasound and guided into the vein. Gauge: 7 french  Image saved and stored.  Normal blood return.  Patient tolerance: Patient tolerated the procedure well with no immediate complications.   INTUBATION Performed by: Enid Skeens  Required items: required blood products, implants, devices, and special equipment available Patient identity confirmed: provided demographic data and hospital-assigned identification number Time out: Immediately prior to procedure a "time out" was called to verify the correct patient, procedure, equipment, support staff.  Indications: direct mac Intubation method: direct, airway protection, ams Preoxygenation: [place] Sedatives: Etomidate Paralytic: none Tube Size: 7 one  cuffed  Post-procedure assessment: chest rise and ETCO2 monitor Breath sounds: equal and absent over the epigastrium Tube secured with: ETT holder Chest x-ray interpreted by me.  Chest x-ray findings: endotracheal tube in appropriate position  Patient tolerated the procedure well with no immediate complications.  CRITICAL CARE Performed by: Enid Skeens   Total critical care time: 120 minutes   Critical care time was exclusive of separately billable procedures and treating other patients.  Critical care was necessary to treat or prevent imminent or life-threatening deterioration.  Critical care was time spent personally by me on the following activities: development of treatment plan with patient and/or surrogate as well as nursing, discussions with consultants, evaluation of patient's response to treatment, examination of patient, obtaining history from patient or surrogate, ordering and performing treatments and interventions, ordering and review of laboratory studies, ordering and review of radiographic studies, pulse oximetry and re-evaluation of patient's condition.   Labs Review Labs Reviewed  CBC WITH DIFFERENTIAL - Abnormal; Notable for the following:    WBC 21.0 (*)    RBC 3.22 (*)    Hemoglobin 10.1 (*)    MCV 115.2 (*)    MCHC 27.2 (*)    Neutro Abs 13.0 (*)    Lymphs Abs 6.3 (*)    Monocytes Absolute 1.7 (*)    All other components within normal limits  BASIC METABOLIC PANEL - Abnormal; Notable for the following:    Sodium 120 (*)    Potassium 7.0 (*)    Chloride 74 (*)    CO2 <7 (*)    Glucose, Bld 1594 (*)    BUN 97 (*)    Creatinine, Ser 4.62 (*)    GFR calc non Af Amer 12 (*)    GFR calc Af Amer 14 (*)    All other components within normal limits  URINALYSIS, ROUTINE W REFLEX MICROSCOPIC - Abnormal; Notable for the following:    APPearance CLOUDY (*)    Glucose, UA >1000 (*)    Protein, ur 30 (*)    All other components within normal limits   HEPATIC FUNCTION PANEL - Abnormal; Notable for the following:    Albumin 3.4 (*)    AST 193 (*)    ALT 93 (*)    Alkaline Phosphatase 118 (*)    Indirect Bilirubin 0.1 (*)    All other components within normal limits  BLOOD GAS, ARTERIAL - Abnormal; Notable for the following:  pH, Arterial 0.000 (*)    pCO2 arterial 12.6 (*)    pO2, Arterial 175.0 (*)    Bicarbonate 0.0 (*)    All other components within normal limits  KETONES, QUALITATIVE - Abnormal; Notable for the following:    Acetone, Bld SMALL (*)    All other components within normal limits  MAGNESIUM - Abnormal; Notable for the following:    Magnesium 2.6 (*)    All other components within normal limits  BLOOD GAS, ARTERIAL - Abnormal; Notable for the following:    pH, Arterial 0.000 (*)    pCO2 arterial 8.4 (*)    pO2, Arterial 272.0 (*)    Bicarbonate 0.0 (*)    All other components within normal limits  BASIC METABOLIC PANEL - Abnormal; Notable for the following:    Sodium 133 (*)    Potassium 5.5 (*)    Chloride 91 (*)    CO2 <7 (*)    Glucose, Bld 1226 (*)    BUN 84 (*)    Creatinine, Ser 3.88 (*)    Calcium 6.8 (*)    GFR calc non Af Amer 15 (*)    GFR calc Af Amer 17 (*)    All other components within normal limits  URINE MICROSCOPIC-ADD ON - Abnormal; Notable for the following:    Bacteria, UA FEW (*)    Casts GRANULAR CAST (*)    All other components within normal limits  I-STAT CG4 LACTIC ACID, ED - Abnormal; Notable for the following:    Lactic Acid, Venous 11.22 (*)    All other components within normal limits  CBG MONITORING, ED - Abnormal; Notable for the following:    Glucose-Capillary >600 (*)    All other components within normal limits  CBG MONITORING, ED - Abnormal; Notable for the following:    Glucose-Capillary >600 (*)    All other components within normal limits  CULTURE, BLOOD (ROUTINE X 2)  CULTURE, BLOOD (ROUTINE X 2)  CULTURE, RESPIRATORY (NON-EXPECTORATED)  URINE RAPID DRUG  SCREEN (HOSP PERFORMED)  TSH  PATHOLOGIST SMEAR REVIEW  BASIC METABOLIC PANEL  BASIC METABOLIC PANEL  BASIC METABOLIC PANEL  BASIC METABOLIC PANEL  BASIC METABOLIC PANEL  BASIC METABOLIC PANEL  BASIC METABOLIC PANEL  CBC  BLOOD GAS, ARTERIAL  BLOOD GAS, ARTERIAL  BLOOD GAS, ARTERIAL  BLOOD GAS, ARTERIAL  BLOOD GAS, ARTERIAL  BLOOD GAS, ARTERIAL  BLOOD GAS, ARTERIAL  PROCALCITONIN  PROCALCITONIN  CK TOTAL AND CKMB  STREP PNEUMONIAE URINARY ANTIGEN  LEGIONELLA ANTIGEN, URINE  POC URINE PREG, ED  CBG MONITORING, ED  I-STAT CG4 LACTIC ACID, ED   Imaging Review Ct Head Wo Contrast  09/03/2013   CLINICAL DATA:  Altered mental status.  EXAM: CT HEAD WITHOUT CONTRAST  TECHNIQUE: Contiguous axial images were obtained from the base of the skull through the vertex without intravenous contrast.  COMPARISON:  None.  FINDINGS: The ventricles are normal in size and configuration. No extra-axial fluid collections are identified. The gray-white differentiation is normal. No CT findings for acute intracranial process such as hemorrhage or infarction. No mass lesions. The brainstem and cerebellum are grossly normal.The bony structures are intact. The paranasal sinuses and mastoid air cells are clear. The globes are intact.  IMPRESSION: No acute intracranial findings.   Electronically Signed   By: Loralie Champagne M.D.   On: 09/03/2013 19:14   Dg Chest Portable 1 View  09/03/2013   CLINICAL DATA:  Endotracheal and NG tube placement.  EXAM: PORTABLE CHEST -  1 VIEW  COMPARISON:  DG CHEST 1V PORT dated 09/03/2013  FINDINGS: Endotracheal tube tip noted 1.1 cm above the tip of the carina, mild proximal repositioning should be considered. NG tube noted with tip below left hemidiaphragm. Poor inspiration. No focal pulmonary infiltrate. No pleural effusion or pneumothorax. Heart size normal. Left posterior lateral sixth rib fracture present.  IMPRESSION: 1. Endotracheal tube with tip approximately 1.1 cm above the  carina. Mild proximal reposition should be considered. 2. NG tube noted with tip below left hemidiaphragm. 3. Nondisplaced left posterior lateral sixth rib fracture. 4. No acute cardiopulmonary disease.   Electronically Signed   By: Maisie Fus  Register   On: 09/03/2013 19:57   Dg Chest Port 1 View  09/03/2013   CLINICAL DATA:  Altered mental status  EXAM: PORTABLE CHEST - 1 VIEW  COMPARISON:  10/18/2011  FINDINGS: No cardiomegaly for technique. No infiltrate or edema suspected. No effusion or pneumothorax. There is a lateral left 6 rib fracture. There is likely associated callus, compatible with remote injury. Chronic 4 mm metallic body associated with the upper right chest.  IMPRESSION: 1.  No active disease. 2. Non acute lateral left sixth rib fracture.   Electronically Signed   By: Tiburcio Pea M.D.   On: 09/03/2013 19:29     EKG Interpretation   Date/Time:  Thursday September 03 2013 18:25:30 EDT Ventricular Rate:  59 PR Interval:  226 QRS Duration: 105 QT Interval:  524 QTC Calculation: 519 R Axis:   84 Text Interpretation:  Sinus rhythm Prolonged PR interval Minimal ST  depression, inferior leads Prolonged QT interval mild peaked T waves  Baseline wander in lead(s) II III aVF V1 Confirmed by Treavor Blomquist  MD, Anvitha Hutmacher  ([id number]) on 09/03/2013 6:59:02 PM      MDM   Final diagnoses:  Altered mental status  Metabolic acidosis  Dehydration  SIRS (systemic inflammatory response syndrome)  Hyperkalemia  DKA (diabetic ketoacidoses)  Hyponatremia  ARF (acute renal failure)  Hypocalcemia  Hypotension   Critical patient arrived, bp 60s systolic, no known details to HPI. Difficult IV, I personally placed 2 peripheral IV guided followed by right femoral for IV access and treatment. No family at bedside.   2 L bolus, no signficant improvement. 2 L repeated, pt opened eyes a few times spontaneously, non verbal.  CT head no acute findings.   EKG mild peaked T waves, prolonged QT. At bedside for  prolonged period, hr varying from tachycardia to bradycardia.  ABG acid base undetectable.  Repeat ordered. Bicarb drip and fluids continued.  Insulin drip held until K, K 7, drip started, bolus per critical care.   Once fluid boluses in, decision to intubate as unresponsive, airway protection.   Rocephin given prophylactic with cultures for sirs/ hypotension. 2 L repeat bolus.  8 L total in the ED.   Temp 85 degrees, bear hugger and future fluids warmed.   Spoke with critical care, agreed with plan, will follow and assist.  Calcium bolus.   Pressors started in the ED, bp improved. Multiple rechecks, minimal improvement, bp low 70s, avoiding NE at this time as heart irritable from metabolic acidosis. The patients results and plan were reviewed and discussed.   Any x-rays performed were personally reviewed by myself.   Differential diagnosis were considered with the presenting HPI.  EKG: reviewed  Filed Vitals:   09/03/13 2016 09/03/13 2030 09/03/13 2045 09/03/13 2100  BP:  87/44 79/43 105/54  Pulse:  48 58 80  TempSrc: Core (Comment)  Resp:  12 12 13   Height:      SpO2:  97% 95% 92%    Admission/ observation were discussed with the admitting physician, patient and/or family and they are comfortable with the plan.   Admitted to ICU    Enid Skeens, MD 09/03/13 2126

## 2013-09-03 NOTE — ED Notes (Signed)
Dopamine drip started at 1412mcg/kg/min via right femoral line

## 2013-09-03 NOTE — ED Notes (Signed)
3rd and 4th NS bolus started per MD verbal order

## 2013-09-03 NOTE — ED Notes (Signed)
OG placed by Terri RN

## 2013-09-03 NOTE — ED Notes (Signed)
NS liter bag #10 started at a rate of 56400ml/hr

## 2013-09-03 NOTE — Procedures (Signed)
Arterial Catheter Insertion Procedure Note Guerry MinorsJacqueline Berber [id number] 06/12/1986  Procedure: Insertion of Arterial Catheter  Indications: Blood pressure monitoring  Procedure Details Consent: Unable to obtain consent because of emergent medical necessity. Time Out: Verified patient identification, verified procedure, site/side was marked, verified correct patient position, special equipment/implants available, medications/allergies/relevent history reviewed, required imaging and test results available.  Performed  Maximum sterile technique was used including antiseptics, cap, gloves, gown, hand hygiene, mask and sheet. Skin prep: Chlorhexidine; local anesthetic administered 20 gauge catheter was inserted into right radial artery using the Seldinger technique.  Evaluation Blood flow good; BP tracing good. Complications: No apparent complications.   Berton Boneugent, Avalene Sealy Renee 09/03/2013

## 2013-09-03 NOTE — ED Notes (Signed)
 4mg  zofran given verbal order

## 2013-09-03 NOTE — ED Notes (Signed)
1 amp sodium bicarbonate given verbal order

## 2013-09-04 ENCOUNTER — Inpatient Hospital Stay (HOSPITAL_COMMUNITY): Payer: Medicare Other

## 2013-09-04 DIAGNOSIS — E1149 Type 2 diabetes mellitus with other diabetic neurological complication: Secondary | ICD-10-CM

## 2013-09-04 DIAGNOSIS — J9600 Acute respiratory failure, unspecified whether with hypoxia or hypercapnia: Secondary | ICD-10-CM

## 2013-09-04 DIAGNOSIS — K3184 Gastroparesis: Secondary | ICD-10-CM

## 2013-09-04 LAB — BASIC METABOLIC PANEL
BUN: 74 mg/dL — ABNORMAL HIGH (ref 6–23)
BUN: 71 mg/dL — ABNORMAL HIGH (ref 6–23)
BUN: 67 mg/dL — ABNORMAL HIGH (ref 6–23)
BUN: 65 mg/dL — ABNORMAL HIGH (ref 6–23)
BUN: 63 mg/dL — ABNORMAL HIGH (ref 6–23)
BUN: 64 mg/dL — ABNORMAL HIGH (ref 6–23)
BUN: 63 mg/dL — ABNORMAL HIGH (ref 6–23)
BUN: 71 mg/dL — AB (ref 6–23)
BUN: 71 mg/dL — AB (ref 6–23)
BUN: 73 mg/dL — AB (ref 6–23)
BUN: 62 mg/dL — ABNORMAL HIGH (ref 6–23)
BUN: 69 mg/dL — AB (ref 6–23)
CALCIUM: 6.6 mg/dL — AB (ref 8.4–10.5)
CALCIUM: 6.5 mg/dL — AB (ref 8.4–10.5)
CHLORIDE: 100 meq/L (ref 96–112)
CHLORIDE: 99 meq/L (ref 96–112)
CO2: <7 mEq/L — CL (ref 19–32)
CO2: 9 mEq/L — CL (ref 19–32)
CO2: 10 mEq/L — CL (ref 19–32)
CO2: 12 mEq/L — ABNORMAL LOW (ref 19–32)
CO2: 14 mEq/L — ABNORMAL LOW (ref 19–32)
CO2: 15 meq/L — AB (ref 19–32)
CO2: 17 meq/L — AB (ref 19–32)
CO2: 17 meq/L — AB (ref 19–32)
CO2: 20 meq/L (ref 19–32)
CO2: 17 mEq/L — ABNORMAL LOW (ref 19–32)
CO2: 16 mEq/L — ABNORMAL LOW (ref 19–32)
CREATININE: 3.29 mg/dL — AB (ref 0.50–1.10)
CREATININE: 3.37 mg/dL — AB (ref 0.50–1.10)
Calcium: 6.8 mg/dL — ABNORMAL LOW (ref 8.4–10.5)
Calcium: 6.6 mg/dL — ABNORMAL LOW (ref 8.4–10.5)
Calcium: 6.5 mg/dL — ABNORMAL LOW (ref 8.4–10.5)
Calcium: 6.5 mg/dL — ABNORMAL LOW (ref 8.4–10.5)
Calcium: 6.5 mg/dL — ABNORMAL LOW (ref 8.4–10.5)
Calcium: 7.5 mg/dL — ABNORMAL LOW (ref 8.4–10.5)
Calcium: 6.6 mg/dL — ABNORMAL LOW (ref 8.4–10.5)
Calcium: 6.7 mg/dL — ABNORMAL LOW (ref 8.4–10.5)
Calcium: 6.5 mg/dL — ABNORMAL LOW (ref 8.4–10.5)
Calcium: 6.4 mg/dL — CL (ref 8.4–10.5)
Chloride: 97 mEq/L (ref 96–112)
Chloride: 98 mEq/L (ref 96–112)
Chloride: 98 mEq/L (ref 96–112)
Chloride: 94 mEq/L — ABNORMAL LOW (ref 96–112)
Chloride: 99 mEq/L (ref 96–112)
Chloride: 98 mEq/L (ref 96–112)
Chloride: 97 mEq/L (ref 96–112)
Chloride: 97 mEq/L (ref 96–112)
Chloride: 99 mEq/L (ref 96–112)
Chloride: 98 mEq/L (ref 96–112)
Creatinine, Ser: 3.47 mg/dL — ABNORMAL HIGH (ref 0.50–1.10)
Creatinine, Ser: 3.47 mg/dL — ABNORMAL HIGH (ref 0.50–1.10)
Creatinine, Ser: 3.56 mg/dL — ABNORMAL HIGH (ref 0.50–1.10)
Creatinine, Ser: 3.65 mg/dL — ABNORMAL HIGH (ref 0.50–1.10)
Creatinine, Ser: 3.63 mg/dL — ABNORMAL HIGH (ref 0.50–1.10)
Creatinine, Ser: 3.64 mg/dL — ABNORMAL HIGH (ref 0.50–1.10)
Creatinine, Ser: 3.61 mg/dL — ABNORMAL HIGH (ref 0.50–1.10)
Creatinine, Ser: 3.63 mg/dL — ABNORMAL HIGH (ref 0.50–1.10)
Creatinine, Ser: 3.62 mg/dL — ABNORMAL HIGH (ref 0.50–1.10)
Creatinine, Ser: 3.65 mg/dL — ABNORMAL HIGH (ref 0.50–1.10)
GFR calc Af Amer: 21 mL/min — ABNORMAL LOW (ref >90)
GFR calc Af Amer: 20 mL/min — ABNORMAL LOW (ref >90)
GFR calc Af Amer: 19 mL/min — ABNORMAL LOW (ref >90)
GFR calc Af Amer: 19 mL/min — ABNORMAL LOW (ref >90)
GFR calc Af Amer: 19 mL/min — ABNORMAL LOW (ref >90)
GFR calc Af Amer: 19 mL/min — ABNORMAL LOW (ref >90)
GFR calc Af Amer: 19 mL/min — ABNORMAL LOW (ref >90)
GFR calc Af Amer: 19 mL/min — ABNORMAL LOW (ref >90)
GFR calc non Af Amer: 18 mL/min — ABNORMAL LOW (ref >90)
GFR calc non Af Amer: 17 mL/min — ABNORMAL LOW (ref >90)
GFR calc non Af Amer: 16 mL/min — ABNORMAL LOW (ref >90)
GFR calc non Af Amer: 16 mL/min — ABNORMAL LOW (ref >90)
GFR calc non Af Amer: 16 mL/min — ABNORMAL LOW (ref >90)
GFR calc non Af Amer: 16 mL/min — ABNORMAL LOW (ref >90)
GFR calc non Af Amer: 16 mL/min — ABNORMAL LOW (ref >90)
GFR calc non Af Amer: 16 mL/min — ABNORMAL LOW (ref >90)
GFR, EST AFRICAN AMERICAN: 21 mL/min — AB (ref >90)
GFR, EST AFRICAN AMERICAN: 20 mL/min — AB (ref >90)
GFR, EST AFRICAN AMERICAN: 19 mL/min — AB (ref >90)
GFR, EST AFRICAN AMERICAN: 19 mL/min — AB (ref >90)
GFR, EST NON AFRICAN AMERICAN: 17 mL/min — AB (ref >90)
GFR, EST NON AFRICAN AMERICAN: 17 mL/min — AB (ref >90)
GFR, EST NON AFRICAN AMERICAN: 18 mL/min — AB (ref >90)
GFR, EST NON AFRICAN AMERICAN: 16 mL/min — AB (ref >90)
GLUCOSE: 829 mg/dL — AB (ref 70–99)
GLUCOSE: 571 mg/dL — AB (ref 70–99)
GLUCOSE: 477 mg/dL — AB (ref 70–99)
GLUCOSE: 108 mg/dL — AB (ref 70–99)
GLUCOSE: 151 mg/dL — AB (ref 70–99)
Glucose, Bld: 937 mg/dL (ref 70–99)
Glucose, Bld: 706 mg/dL (ref 70–99)
Glucose, Bld: 344 mg/dL — ABNORMAL HIGH (ref 70–99)
Glucose, Bld: 241 mg/dL — ABNORMAL HIGH (ref 70–99)
Glucose, Bld: 68 mg/dL — ABNORMAL LOW (ref 70–99)
Glucose, Bld: 191 mg/dL — ABNORMAL HIGH (ref 70–99)
Glucose, Bld: 148 mg/dL — ABNORMAL HIGH (ref 70–99)
POTASSIUM: 3.1 meq/L — AB (ref 3.7–5.3)
POTASSIUM: 3.3 meq/L — AB (ref 3.7–5.3)
POTASSIUM: 3.6 meq/L — AB (ref 3.7–5.3)
Potassium: 4.1 mEq/L (ref 3.7–5.3)
Potassium: 4 mEq/L (ref 3.7–5.3)
Potassium: 3.8 mEq/L (ref 3.7–5.3)
Potassium: 3.2 mEq/L — ABNORMAL LOW (ref 3.7–5.3)
Potassium: 3 mEq/L — ABNORMAL LOW (ref 3.7–5.3)
Potassium: 3.3 mEq/L — ABNORMAL LOW (ref 3.7–5.3)
Potassium: 3.6 mEq/L — ABNORMAL LOW (ref 3.7–5.3)
Potassium: 3.6 mEq/L — ABNORMAL LOW (ref 3.7–5.3)
Potassium: 3.5 mEq/L — ABNORMAL LOW (ref 3.7–5.3)
SODIUM: 131 meq/L — AB (ref 137–147)
SODIUM: 137 meq/L (ref 137–147)
SODIUM: 138 meq/L (ref 137–147)
SODIUM: 141 meq/L (ref 137–147)
SODIUM: 140 meq/L (ref 137–147)
Sodium: 135 mEq/L — ABNORMAL LOW (ref 137–147)
Sodium: 134 mEq/L — ABNORMAL LOW (ref 137–147)
Sodium: 137 mEq/L (ref 137–147)
Sodium: 138 mEq/L (ref 137–147)
Sodium: 141 mEq/L (ref 137–147)
Sodium: 139 mEq/L (ref 137–147)
Sodium: 137 mEq/L (ref 137–147)

## 2013-09-04 LAB — GLUCOSE, CAPILLARY
GLUCOSE-CAPILLARY: 587 mg/dL — AB (ref 70–99)
GLUCOSE-CAPILLARY: 474 mg/dL — AB (ref 70–99)
GLUCOSE-CAPILLARY: 353 mg/dL — AB (ref 70–99)
GLUCOSE-CAPILLARY: 185 mg/dL — AB (ref 70–99)
GLUCOSE-CAPILLARY: 191 mg/dL — AB (ref 70–99)
GLUCOSE-CAPILLARY: 161 mg/dL — AB (ref 70–99)
GLUCOSE-CAPILLARY: 144 mg/dL — AB (ref 70–99)
Glucose-Capillary: 126 mg/dL — ABNORMAL HIGH (ref 70–99)
Glucose-Capillary: 140 mg/dL — ABNORMAL HIGH (ref 70–99)
Glucose-Capillary: 207 mg/dL — ABNORMAL HIGH (ref 70–99)
Glucose-Capillary: 240 mg/dL — ABNORMAL HIGH (ref 70–99)
Glucose-Capillary: 277 mg/dL — ABNORMAL HIGH (ref 70–99)
Glucose-Capillary: 415 mg/dL — ABNORMAL HIGH (ref 70–99)
Glucose-Capillary: 427 mg/dL — ABNORMAL HIGH (ref 70–99)
Glucose-Capillary: 521 mg/dL — ABNORMAL HIGH (ref 70–99)
Glucose-Capillary: 61 mg/dL — ABNORMAL LOW (ref 70–99)
Glucose-Capillary: 66 mg/dL — ABNORMAL LOW (ref 70–99)
Glucose-Capillary: 77 mg/dL (ref 70–99)
Glucose-Capillary: 93 mg/dL (ref 70–99)
Glucose-Capillary: >600 mg/dL (ref 70–99)
Glucose-Capillary: >600 mg/dL (ref 70–99)
Glucose-Capillary: >600 mg/dL (ref 70–99)
Glucose-Capillary: >600 mg/dL (ref 70–99)

## 2013-09-04 LAB — BLOOD GAS, ARTERIAL
ACID-BASE DEFICIT: 6.4 mmol/L — AB (ref 0.0–2.0)
ACID-BASE DEFICIT: 24.5 mmol/L — AB (ref 0.0–2.0)
Acid-base deficit: 20.5 mmol/L — ABNORMAL HIGH (ref 0.0–2.0)
Acid-base deficit: 15.9 mmol/L — ABNORMAL HIGH (ref 0.0–2.0)
BICARBONATE: 11.4 meq/L — AB (ref 20.0–24.0)
BICARBONATE: 5.3 meq/L — AB (ref 20.0–24.0)
Bicarbonate: 8.7 mEq/L — ABNORMAL LOW (ref 20.0–24.0)
Bicarbonate: 16.8 mEq/L — ABNORMAL LOW (ref 20.0–24.0)
DRAWN BY: 310571
DRAWN BY: 235321
Drawn by: 235321
Drawn by: 310571
FIO2: 0.3 %
FIO2: 0.3 %
FIO2: 0.3 %
FIO2: 0.3 %
LHR: 30 {breaths}/min
O2 SAT: 98.1 %
O2 Saturation: 94 %
O2 Saturation: 97.2 %
O2 Saturation: 96.3 %
PATIENT TEMPERATURE: 36.3
PCO2 ART: 33.7 mmHg — AB (ref 35.0–45.0)
PCO2 ART: 26.4 mmHg — AB (ref 35.0–45.0)
PCO2 ART: 19.6 mmHg — AB (ref 35.0–45.0)
PEEP/CPAP: 5 cmH2O
PEEP: 5 cmH2O
PEEP: 5 cmH2O
PEEP: 5 cmH2O
PH ART: 7.049 — AB (ref 7.350–7.450)
PO2 ART: 124 mmHg — AB (ref 80.0–100.0)
PO2 ART: 107 mmHg — AB (ref 80.0–100.0)
Patient temperature: 33.4
Patient temperature: 37
Patient temperature: 37
Pressure control: 20 cmH2O
Pressure control: 20 cmH2O
Pressure control: 20 cmH2O
RATE: 30 resp/min
RATE: 30 resp/min
RATE: 30 resp/min
TCO2: 5.6 mmol/L (ref 0–100)
TCO2: 11.4 mmol/L (ref 0–100)
TCO2: 16 mmol/L (ref 0–100)
TCO2: 9 mmol/L (ref 0–100)
pCO2 arterial: 32.6 mmHg — ABNORMAL LOW (ref 35.0–45.0)
pH, Arterial: 7.033 — CL (ref 7.350–7.450)
pH, Arterial: 7.155 — CL (ref 7.350–7.450)
pH, Arterial: 7.421 (ref 7.350–7.450)
pO2, Arterial: 113 mmHg — ABNORMAL HIGH (ref 80.0–100.0)
pO2, Arterial: 85.4 mmHg (ref 80.0–100.0)

## 2013-09-04 LAB — CK TOTAL AND CKMB (NOT AT ARMC)
CK TOTAL: 71 U/L (ref 7–177)
CK, MB: 4.6 ng/mL — ABNORMAL HIGH (ref 0.3–4.0)
Relative Index: INVALID (ref 0.0–2.5)

## 2013-09-04 LAB — MRSA PCR SCREENING: MRSA BY PCR: NEGATIVE

## 2013-09-04 LAB — LACTIC ACID, PLASMA
LACTIC ACID, VENOUS: 8.6 mmol/L — AB (ref 0.5–2.2)
Lactic Acid, Venous: 7.7 mmol/L — ABNORMAL HIGH (ref 0.5–2.2)

## 2013-09-04 LAB — TROPONIN I: Troponin I: <0.3 ng/mL (ref <0.30)

## 2013-09-04 LAB — LEGIONELLA ANTIGEN, URINE

## 2013-09-04 LAB — TSH: TSH: 3.208 u[IU]/mL (ref 0.350–4.500)

## 2013-09-04 LAB — STREP PNEUMONIAE URINARY ANTIGEN: STREP PNEUMO URINARY ANTIGEN: NEGATIVE

## 2013-09-04 LAB — PROCALCITONIN: Procalcitonin: 29.92 ng/mL

## 2013-09-04 MED ORDER — POTASSIUM CHLORIDE 10 MEQ/50ML IV SOLN
10.0000 meq | INTRAVENOUS | Status: AC
  Administered 2013-09-04 (×2): 10 meq via INTRAVENOUS
  Filled 2013-09-04: qty 50

## 2013-09-04 MED ORDER — VANCOMYCIN HCL 500 MG IV SOLR
500.0000 mg | INTRAVENOUS | Status: DC
  Administered 2013-09-05 – 2013-09-06 (×2): 500 mg via INTRAVENOUS
  Filled 2013-09-04 (×3): qty 500

## 2013-09-04 MED ORDER — PIPERACILLIN-TAZOBACTAM 3.375 G IVPB
3.3750 g | Freq: Three times a day (TID) | INTRAVENOUS | Status: DC
  Administered 2013-09-04 – 2013-09-07 (×9): 3.375 g via INTRAVENOUS
  Filled 2013-09-04 (×10): qty 50

## 2013-09-04 MED ORDER — DEXTROSE-NACL 5-0.45 % IV SOLN
INTRAVENOUS | Status: DC
  Administered 2013-09-04 – 2013-09-05 (×4): via INTRAVENOUS
  Administered 2013-09-05: 1000 mL via INTRAVENOUS
  Administered 2013-09-06 – 2013-09-07 (×7): via INTRAVENOUS

## 2013-09-04 MED ORDER — SODIUM CHLORIDE 0.9 % IV SOLN
1.0000 g | Freq: Once | INTRAVENOUS | Status: AC
  Administered 2013-09-05: 1 g via INTRAVENOUS
  Filled 2013-09-04: qty 10

## 2013-09-04 MED ORDER — VANCOMYCIN HCL 10 G IV SOLR
1500.0000 mg | Freq: Once | INTRAVENOUS | Status: AC
  Administered 2013-09-04: 1500 mg via INTRAVENOUS
  Filled 2013-09-04: qty 1500

## 2013-09-04 MED ORDER — POTASSIUM CHLORIDE 20 MEQ/15ML (10%) PO LIQD
20.0000 meq | Freq: Once | ORAL | Status: AC
  Administered 2013-09-04: 20 meq
  Filled 2013-09-04: qty 15

## 2013-09-04 MED ORDER — SODIUM CHLORIDE 0.9 % IV SOLN
1.0000 g | Freq: Once | INTRAVENOUS | Status: AC
  Administered 2013-09-04: 1 g via INTRAVENOUS
  Filled 2013-09-04: qty 10

## 2013-09-04 MED ORDER — LEVOFLOXACIN IN D5W 750 MG/150ML IV SOLN
750.0000 mg | Freq: Every day | INTRAVENOUS | Status: DC
  Administered 2013-09-04: 750 mg via INTRAVENOUS
  Filled 2013-09-04: qty 150

## 2013-09-04 MED ORDER — POTASSIUM CHLORIDE 10 MEQ/50ML IV SOLN
INTRAVENOUS | Status: AC
  Filled 2013-09-04: qty 50

## 2013-09-04 MED ORDER — DEXTROSE 5 % IV SOLN
1.0000 g | INTRAVENOUS | Status: DC
  Filled 2013-09-04: qty 10

## 2013-09-04 MED FILL — Medication: Qty: 1 | Status: AC

## 2013-09-04 NOTE — Progress Notes (Signed)
Grandparents at bedside.  Updated on patients current clinical status.  They indicate in Nov 2014 she was admitted to [facility] with a similar illness.  Grandmother reports PNA was thought to be the triggering factor at that time.  She developed a wound on her back and [facility] & blood glucose stabilization.  She was discharged from [facility] in Jan 2015 and lived with her Grandparents as recently as two weeks prior to admit but she had returned to her apartment (lived alone).  She is not known to use ETOH or drugs and has not complained of recent illness.  Discussed possibility [facility] if worsening renal function / acidosis.     Plan: -f/u pending labs -if acute decompensation, consider CT abd for source   Canary BrimBrandi Lael Wetherbee, NP-C  Pulmonary & Critical Care Pgr: [phone] or [phone]

## 2013-09-04 NOTE — Progress Notes (Signed)
CRITICAL VALUE ALERT  Critical value received:  Ca 6.4  Date of notification:  09/04/2013  Time of notification:  2313 pm  Critical value read back: yes  Nurse who received alert:  Conley RollsGarland, Emry Barbato Lavern, RN  MD notified (1st page):  Glade NurseE. Link, MD  Time of first page:  2324pm  MD notified (2nd page):  Time of second page:  Responding MD:    Time MD responded:

## 2013-09-04 NOTE — Progress Notes (Signed)
INITIAL NUTRITION ASSESSMENT  DOCUMENTATION CODES Per approved criteria  -Not Applicable   INTERVENTION: - If pt unable to be extubated in the next 24-48 hours, recommend start TF via OGT of Vital AF 1.2 start at 69m/hr increase by 128mevery 4 hours to goal of 5020mr. Goal rate will provide 1440 calories, 90g protein, 973m66mee water. TF plus current calories from Propofol will provide 1804 calories and meet 103% estimated calorie needs and 100% estimated protein needs - Will continue to monitor   NUTRITION DIAGNOSIS: Inadequate oral intake related to inability to eat as evidenced by NPO, mechanical ventilation.   Goal: Initiation of TF with goal to meet >90% of estimated nutritional needs  Monitor:  Weights, labs, TF initiation/advancement, vent status   Reason for Assessment: Malnutrition screening tool, ventilated pt   27 y65. female  Admitting Dx: DKA and shock  ASSESSMENT: Pt discussed during multidisciplinary rounds. Found unresponsive, initial glucose 1594 mg/dL. Hx of type 1  IDDM, CKD, hypothyroidism, anemia, and vitamin D deficiency. Intubated yesterday for progressive respiratory failure. Pt oliguric and has received over 11L of normal saline. Pt with distended abdomen with tenderness and diabetic gastroparesis.   Patient is currently intubated on ventilator support.  MV: 11.1 L/min Temp (24hrs), Avg:95.6 F (35.3 C), Min:88.2 F (31.2 C), Max:100.4 F (38 C)  Propofol: 13.8 ml/hr - provides 364 calories   Potassium low, getting replacement per tube  BUN/Cr very elevated with low GFR Magnesium elevated Alk phos, AST/ALT elevated    Height: Ht Readings from Last 1 Encounters:  09/03/13 '5\' 5"'  (1.651 m)    Weight: Wt Readings from Last 1 Encounters:  09/03/13 127 lb 3.3 oz (57.7 kg)    Ideal Body Weight: 125 lb   % Ideal Body Weight: 102%  Wt Readings from Last 10 Encounters:  09/03/13 127 lb 3.3 oz (57.7 kg)  04/11/13 107 lb (48.535 kg)   03/16/13 130 lb (58.968 kg)  10/22/11 143 lb 11.8 oz (65.2 kg)  08/22/11 116 lb 2.9 oz (52.7 kg)    Usual Body Weight: 107 lb in October 2014  % Usual Body Weight: 119%  BMI:  Body mass index is 21.17 kg/(m^2).  Estimated Nutritional Needs: Kcal: [id number]: 70-90g Fluid: >1.7L/day  Skin: +2 facial edema  Diet Order: NPO  EDUCATION NEEDS: -No education needs identified at this time   Intake/Output Summary (Last 24 hours) at 09/04/13 1226 Last data filed at 09/04/13 1215  Gross per 24 hour  Intake 5091.53 ml  Output    530 ml  Net 4561.53 ml    Last BM: PTA  Labs:   Recent Labs Lab 09/03/13 1820 09/03/13 2003  09/04/13 0620 09/04/13 0820 09/04/13 1025  NA 120* 133*  < > 131* 137 138  K 7.0* 5.5*  < > 3.2* 3.1* 3.0*  CL 74* 91*  < > 94* 99 98  CO2 <7* <7*  < > 10* 12* 14*  BUN 97* 84*  < > 71* 71* 69*  CREATININE 4.62* 3.88*  < > 3.47* 3.56* 3.65*  CALCIUM 9.1 6.8*  < > 6.5* 6.5* 6.5*  MG  --  2.6*  --   --   --   --   GLUCOSE 1594* 1226*  < > 571* 477* 344*  < > = values in this interval not displayed.  CBG (last 3)   Recent Labs  09/04/13 0251 09/04/13 0358 09/04/13 0712  GLUCAP >600* >600* 474*    Scheduled Meds: . antiseptic oral  rinse  15 mL Mouth Rinse QID  . chlorhexidine  15 mL Mouth Rinse BID  . heparin subcutaneous  5,000 Units Subcutaneous 3 times per day  . pantoprazole (PROTONIX) IV  40 mg Intravenous Daily  . piperacillin-tazobactam (ZOSYN)  IV  3.375 g Intravenous Q8H  . vancomycin  1,500 mg Intravenous Once  . [START ON 09/05/2013] vancomycin  500 mg Intravenous Q24H    Continuous Infusions: . fentaNYL infusion INTRAVENOUS 175 mcg/hr (09/04/13 1200)  . insulin (NOVOLIN-R) infusion 9 mL/hr at 09/04/13 1200  . norepinephrine (LEVOPHED) Adult infusion 18 mcg/min (09/04/13 1200)  . propofol 40 mcg/kg/min (09/04/13 1000)  .  sodium bicarbonate 150 mEq in sterile water 1000 mL infusion 200 mL/hr at 09/04/13 1036    Past  Medical History  Diagnosis Date  . Diabetes mellitus   . Hypothyroidism   . Anemia   . Vitamin D deficiency     Past Surgical History  Procedure Laterality Date  . Induced abortion      Mikey College MS, [place], [place] Pager [phone] After Hours Pager

## 2013-09-04 NOTE — Progress Notes (Signed)
Inpatient Diabetes Program Recommendations  AACE/ADA: New Consensus Statement on Inpatient Glycemic Control (2013)  Target Ranges:  Prepandial:   less than 140 mg/dL      Peak postprandial:   less than 180 mg/dL (1-2 hours)      Critically ill patients:  140 - 180 mg/dL      **Noted Critical Care physician d/c'd orders to start D5 1/2 NS once pt's CBGs drop below 250 mg/dl.  Called RN Kathlene NovemberMike to discuss.  Per RN, pt having worsening renal function and IVF changed.  Alerted RN to the fact that pt will likely need Dextrose added to IVF to prevent hypoglycemia while on the IV insulin drip.  If CBGs become too low, GlucoStabilizer will prompt RN to suspend IV insulin drip, however, pt needs IV insulin to help with her DKA.  Asked RN to please call Dr. Kendrick FriesMcQuaid once CBGs drop below 150 mg/dl to address need for Dextrose in IVF again.  Will follow. [name] FinlandJeannine Johnston Angeliah Wisdom RN, MSN, CDE Diabetes Coordinator Inpatient Diabetes Program Team Pager: [phone] (8a-10p)

## 2013-09-04 NOTE — Progress Notes (Signed)
eLink Physician-Brief Progress Note Patient Name: [name] Ross DOB: 03/20/1986 MRN: [id number]  Date of Service  09/04/2013   HPI/Events of Note  Corrected calcium is 6.9 in the setting of renal insufficiency   eICU Interventions  Plan: Calcium replaced Check intact PTH   Intervention Category Intermediate Interventions: Electrolyte abnormality - evaluation and management  Henya Aguallo 09/04/2013, 11:39 PM

## 2013-09-04 NOTE — Progress Notes (Signed)
ANTIBIOTIC CONSULT NOTE - INITIAL  Pharmacy Consult for vancomycin and Zosyn Indication: r/o sepsis secondary to suspected intra-abdominal infection vs pneumonia  No Known Allergies  Patient Measurements: Height: 5\' 5"  (165.1 cm) Weight: 127 lb 3.3 oz (57.7 kg) IBW/kg (Calculated) : 57  Vital Signs: Temp: 100.2 F (37.9 C) (03/20 1127) Temp src: Core (Comment) (03/20 0800) BP: 104/42 mmHg (03/20 1127) Pulse Rate: 120 (03/20 1127) Intake/Output from previous day: 03/19 0701 - 03/20 0700 In: 2878.5 [I.V.:2688.5; NG/GT:40; IV Piggyback:150] Out: 170 [Urine:70; Emesis/NG output:100] Intake/Output from this shift: Total I/O In: 1325 [I.V.:1245; NG/GT:80] Out: 285 [Urine:185; Emesis/NG output:100]  Labs:  Recent Labs  09/03/13 1820 09/03/13 2003  09/04/13 0620 09/04/13 0820 09/04/13 1025  WBC 21.0* 9.8  --   --   --   --   HGB 10.1* 8.2*  --   --   --   --   PLT 378 222  --   --   --   --   CREATININE 4.62* 3.88*  < > 3.47* 3.56* 3.65*  < > = values in this interval not displayed. Estimated Creatinine Clearance: 21 ml/min (by C-G formula based on Cr of 3.65).   Medical History: Past Medical History  Diagnosis Date  . Diabetes mellitus   . Hypothyroidism   . Anemia   . Vitamin D deficiency     Medications:  Scheduled:  . antiseptic oral rinse  15 mL Mouth Rinse QID  . chlorhexidine  15 mL Mouth Rinse BID  . heparin subcutaneous  5,000 Units Subcutaneous 3 times per day  . pantoprazole (PROTONIX) IV  40 mg Intravenous Daily   Infusions:  . fentaNYL infusion INTRAVENOUS 150 mcg/hr (09/04/13 0942)  . insulin (NOVOLIN-R) infusion 8.7 mL/hr at 09/04/13 1100  . norepinephrine (LEVOPHED) Adult infusion 20 mcg/min (09/04/13 1100)  . propofol 40 mcg/kg/min (09/04/13 1000)  .  sodium bicarbonate 150 mEq in sterile water 1000 mL infusion 200 mL/hr at 09/04/13 1036   Assessment: 28 year old diabetic female found unresponsive on 3/19 and admitted. Initial glucose 1594,  WBC 21, PH un-measurable, +serum Ketones, lactic acid >11 w/ early RLL infiltrate. PCCM asked to admit for DKA and possible septic shock. Hx IDDM, CKD. Patient originally started on ceftriaxone and then levofloxacin for suspected pneumonia. Pharmacy has now been consulted to dose vancomycin and Zosyn for r/o sepsis secondary to suspected intra-abdominal infection vs pneumonia.   Antiinfectives 3/19 >> ceftriaxone x1 3/20 >> levofloxacin x1 3/20 >> Zosyn >> 3/20 >> vancomycin >>    Tmax: 100.4 (88.2 on admit) WBCs: 21.0 --> 9.8 Renal: AoC CKD, SCr 3.6 (baseline ~1.7), CrCl 21 ml/min CG, 30 ml/min normalized  Microbiology 3/19 blood x2: sent 3/19 MRSA PCR (-) 3/20 sputum: sent    Goal of Therapy:  Vancomycin trough level 15-20 mcg/ml appropriate renal dosing of Zosyn  Plan:  - vancomycin 1500mg  IV x1 as a loading dose - vancomycin 500mg  IV q24h to start 3/21 at 1200 - start Zosyn 3.375G IV q8h to be infused over 4 hours - vancomycin trough at steady state if indicated - follow-up clinical course, culture results, renal function - follow-up antibiotic de-escalation and length of therapy  Thank you for the consult.  Tomi BambergerJesse Candra Wegner, PharmD, BCPS Pager: [phone] Pharmacy: [phone] 09/04/2013 11:53 AM

## 2013-09-04 NOTE — Progress Notes (Signed)
Name: [name] Ross MRN: [id number] DOB: [birth year]    ADMISSION DATE:  09/03/2013 CONSULTATION DATE:  3/19   REFERRING MD :  Jodi Mourning  PRIMARY SERVICE: pccm   CHIEF COMPLAINT:  dka and shock   BRIEF PATIENT DESCRIPTION:  28 year old diabetic female. Found unresponsive on 3/19. Initial glucose 1594, WBC 21, PH un-measurable, +serum Ketones, lactic acid >11 w/  early rll infiltrate. PCCM asked to admit for DKA and possible septic shock.   SIGNIFICANT EVENTS / STUDIES:  UDS 3/19>>neg  LINES / TUBES: oett 3/19>>> Right fem CVL (ER)3/19>>>  CULTURES: BCX2 3/19>>> UC 3/19>>> Sputum 3/19>>>  ANTIBIOTICS: levaquin 3/19>>> Rocephin 3/19>>>   SUBJECTIVE:  Received > 11 L NS; oliguric  VITAL SIGNS: Temp:  [88.2 F (31.2 C)-100.4 F (38 C)] 100.2 F (37.9 C) (03/20 1030) Pulse Rate:  [48-119] 111 (03/20 1030) Resp:  [4-43] 24 (03/20 1030) BP: (65-128)/(35-65) 96/39 mmHg (03/20 0759) SpO2:  [92 %-100 %] 100 % (03/20 1030) Arterial Line BP: (91-164)/(41-88) 119/53 mmHg (03/20 1030) FiO2 (%):  [30 %-100 %] 30 % (03/20 0800) Weight:  [57.7 kg (127 lb 3.3 oz)] 57.7 kg (127 lb 3.3 oz) (03/19 2200) HEMODYNAMICS:   VENTILATOR SETTINGS: Vent Mode:  [-] PCV FiO2 (%):  [30 %-100 %] 30 % Set Rate:  [24 bmp-35 bmp] 30 bmp Vt Set:  [450 mL-500 mL] 500 mL PEEP:  [5 cmH20] 5 cmH20 Pressure Support:  [15 cmH20] 15 cmH20 Plateau Pressure:  [11 cmH20-21 cmH20] 19 cmH20 INTAKE / OUTPUT: Intake/Output     03/19 0701 - 03/20 0700 03/20 0701 - 03/21 0700   I.V. (mL/kg) 2688.5 (46.6) 980 (17)   NG/GT 40 80   IV Piggyback 150    Total Intake(mL/kg) 2878.5 (49.9) 1060 (18.4)   Urine (mL/kg/hr) 70 185 (0.8)   Emesis/NG output 100 100 (0.4)   Total Output 170 285   Net +2708.5 +775          PHYSICAL EXAMINATION: General:  Sedated on vent HEENT: NCAT, ETT in place PULM: Crackles bilaterally CV: Tachy, regular AB: distended, tender, BS+ Ext: warm, trace edema  noted Neuro: sedated on vent  LABS:  CBC  Recent Labs Lab 09/03/13 1820 09/03/13 2003  WBC 21.0* 9.8  HGB 10.1* 8.2*  HCT 37.1 27.3*  PLT 378 222   Coag's No results found for this basename: APTT, INR,  in the last 168 hours BMET  Recent Labs Lab 09/04/13 0405 09/04/13 0620 09/04/13 0820  NA 137 131* 137  K 3.8 3.2* 3.1*  CL 98 94* 99  CO2 9* 10* 12*  BUN 71* 71* 71*  CREATININE 3.47* 3.47* 3.56*  GLUCOSE 706* 571* 477*   Electrolytes  Recent Labs Lab 09/03/13 2003  09/04/13 0405 09/04/13 0620 09/04/13 0820  CALCIUM 6.8*  < > 6.6* 6.5* 6.5*  MG 2.6*  --   --   --   --   < > = values in this interval not displayed. Sepsis Markers  Recent Labs Lab 09/03/13 1836 09/03/13 2003 09/03/13 2359 09/04/13 0405  LATICACIDVEN 11.22*  --  7.7*  --   PROCALCITON  --  5.60  --  29.92   ABG  Recent Labs Lab 09/04/13 0058 09/04/13 0415 09/04/13 0740  PHART 7.033* 7.049* 7.155*  PCO2ART 19.6* 32.6* 33.7*  PO2ART 107.0* 113.0* 85.4   Liver Enzymes  Recent Labs Lab 09/03/13 1820  AST 193*  ALT 93*  ALKPHOS 118*  BILITOT 0.4  ALBUMIN 3.4*  Cardiac Enzymes  Recent Labs Lab 09/03/13 2359  TROPONINI <0.30   Glucose  Recent Labs Lab 09/03/13 2358 09/04/13 0059 09/04/13 0157 09/04/13 0251 09/04/13 0358 09/04/13 0712  GLUCAP >600* >600* >600* >600* >600* 474*    Imaging    CXR:  ett good position.   ASSESSMENT / PLAN:  PULMONARY A: Acute respiratory failure in setting of ams and profound acidosis  On PS ventilation P:   Continue PCV for now as she is comfortable and ABG is improving Close obs of abg Daily SBT/WUA/CXR/ABG   CARDIOVASCULAR A:  Circulatory shock: sepsis vs cardiogenic dysfxn in setting of profound acidosis       Currently euvolemic to volume up      Bradycardia in setting of acidosis and hyperkalemia  P:  Check CVP Continue levophed F/U cortisol  Echo Repeat lactic acid  RENAL A:  AKI, Oliguric due to  hypovolemia and shock DKA with metabolic acidosis improving Hypokalemia P:   Bicarb gtt only IVF for now Monitor CVP Repeat BMET/ABG this afternoon D/C bicarb gtt this afternoon if ABG continues to improve  GASTROINTESTINAL A:  Diabetic gastroparesis  Distended abdomen with tenderness P:   ppi  Start with KUB Serial exams If shock/lactic acid worse > CT abdomen   HEMATOLOGIC A:  Anemia  P:  [place] heparin  F/u cbc   INFECTIOUS A: Septic shock, source uncertain, I doubt levophed  P:   Pan culture  Change ceftriaxone/levaquin to zosyn > abdominal source? Add vanc for now  ENDOCRINE A:  DKA      DM w/ Hyperglycemia  P:   dka protocol   NEUROLOGIC A: acute encephalopathy in setting of DKA   P:   Continue fentanyl/propofol for now  Family : still no family here, social work trying to find them CODE: full  I have personally obtained a history, examined the patient, evaluated laboratory and imaging results, formulated the assessment and plan and placed orders. CRITICAL CARE: The patient is critically ill with multiple organ systems failure and requires high complexity decision making for assessment and support, frequent evaluation and titration of therapies, application of advanced monitoring technologies and extensive interpretation of multiple databases. Critical Care Time devoted to patient care services described in this note is 45  minutes.   Yolonda KidaMCQUAID, DOUGLAS [place] PCCM Pager: [phone] Cell: [phone] If no response, call [phone]  09/04/2013, 10:53 AM

## 2013-09-04 NOTE — Progress Notes (Signed)
CARE MANAGEMENT NOTE 09/04/2013  Patient:  [name] Ross,[name] Ross   Account Number:  [id number]  Date Initiated:  09/04/2013  Documentation initiated by:  Jeree Delcid  Subjective/Objective Assessment:   28 yo found down in her home by a friend who called ems and then left.  hyperglycemic event, resp failure, pt on iv meds, art.line inserted, and intubated     Action/Plan:   tbd=gransparents came to hospital when late afternoon on 082015/mother lives in [place] and [facility]   Anticipated DC Date:  09/07/2013   Anticipated DC Plan:  HOME/SELF CARE  In-house referral  NA  NA      DC Planning Services  NA      [facility]  [facility]   Choice offered to / List presented to:  NA   DME arranged  NA      DME agency  NA     HH arranged  NA      HH agency  NA   Status of service:  In process, will continue to follow Medicare Important Message given?  NA - LOS <3 / Initial given by admissions (If response is "NO", the following Medicare IM given date fields will be blank) Date Medicare IM given:   Date Additional Medicare IM given:    Discharge Disposition:    Per UR Regulation:  Reviewed for med. necessity/level of care/duration of stay  If discussed at Long Length of Stay Meetings, dates discussed:    Comments:  [id number]/Lonnel Gjerde Stark JockDavis, RN, BSN, [place] [phone] Chart Reviewed for discharge and hospital needs. Discharge needs at time of review:  None present will follow for needs. Review of patient progress due on [phone].

## 2013-09-04 NOTE — Progress Notes (Addendum)
Inpatient Diabetes Program Recommendations  AACE/ADA: New Consensus Statement on Inpatient Glycemic Control (2013)  Target Ranges:  Prepandial:   less than 140 mg/dL      Peak postprandial:   less than 180 mg/dL (1-2 hours)      Critically ill patients:  140 - 180 mg/dL     **Pt admitted with DKA/Shock.  Found unresponsive and transferred to hospital.  Glucose 1594 mg/dl on admit.  CO2 <7.   Diabetes history: Type 1 DM (diagnosed at age 89)  Home Insulin Regimen: ([facility]): Lantus 11 units daily + Humalog (2 units with breakfast and lunch) and 1 unit with dinner + SSI  Current orders: IV insulin drip per GlucoStabilizer    **I reviewed the "Care Everywhere" tab of [facility] and found that [facility] for DKA.  [facility] with insulin regimen as stated above (Lantus and Humalog).  It also appears that pt had a [facility] a [facility] [place] on [place].   **Per chart review, pt has had numerous visits for hyperglycemia, DKA, and cellulitis in the past.  Do not see that she has a PCP or that she sees a PCP on a regular basis.  Pt likely needs to get established with an Endocrinologist for better management of her DM.  **Pt currently intubated and sedated.  Receiving IV vasopressor support.  Still hyperglycemic and acidotic.  **Spoke with RN caring for pt this morning.  RN has reset the GlucoStabilizer to the original multiplier since the IV insulin drip rate was >30 units/hr.  Insulin drip rate currently 10.7 units/hr and last CBG 415 mg/dl.  Confirmed that RN caring for pt knows how to utilize the ICU Protocol Sidebar in [facility].   Will follow. Ambrose FinlandJeannine Johnston Apryll Hinkle RN, MSN, CDE Diabetes Coordinator Inpatient Diabetes Program Team Pager: [phone] (8a-10p)

## 2013-09-05 ENCOUNTER — Inpatient Hospital Stay (HOSPITAL_COMMUNITY): Payer: Medicare Other

## 2013-09-05 DIAGNOSIS — I369 Nonrheumatic tricuspid valve disorder, unspecified: Secondary | ICD-10-CM

## 2013-09-05 LAB — CBC WITH DIFFERENTIAL/PLATELET
Basophils Absolute: 0 10*3/uL (ref 0.0–0.1)
Basophils Relative: 0 % (ref 0–1)
Eosinophils Absolute: 0.3 10*3/uL (ref 0.0–0.7)
Eosinophils Relative: 3 % (ref 0–5)
HCT: 22.8 % — ABNORMAL LOW (ref 36.0–46.0)
Hemoglobin: 8.3 g/dL — ABNORMAL LOW (ref 12.0–15.0)
Lymphocytes Relative: 23 % (ref 12–46)
Lymphs Abs: 2.3 10*3/uL (ref 0.7–4.0)
MCH: 31 pg (ref 26.0–34.0)
MCHC: 36.4 g/dL — AB (ref 30.0–36.0)
MCV: 85.1 fL (ref 78.0–100.0)
Monocytes Absolute: 0.5 10*3/uL (ref 0.1–1.0)
Monocytes Relative: 4 % (ref 3–12)
NEUTROS ABS: 7.1 10*3/uL (ref 1.7–7.7)
NEUTROS PCT: 70 % (ref 43–77)
PLATELETS: 161 10*3/uL (ref 150–400)
RBC: 2.68 MIL/uL — ABNORMAL LOW (ref 3.87–5.11)
RDW: 14.3 % (ref 11.5–15.5)
WBC: 10.1 10*3/uL (ref 4.0–10.5)

## 2013-09-05 LAB — BASIC METABOLIC PANEL
BUN: 60 mg/dL — ABNORMAL HIGH (ref 6–23)
BUN: 60 mg/dL — ABNORMAL HIGH (ref 6–23)
BUN: 58 mg/dL — ABNORMAL HIGH (ref 6–23)
BUN: 59 mg/dL — ABNORMAL HIGH (ref 6–23)
BUN: 56 mg/dL — AB (ref 6–23)
BUN: 61 mg/dL — AB (ref 6–23)
BUN: 58 mg/dL — AB (ref 6–23)
CALCIUM: 6.3 mg/dL — AB (ref 8.4–10.5)
CALCIUM: 6.9 mg/dL — AB (ref 8.4–10.5)
CALCIUM: 6.2 mg/dL — AB (ref 8.4–10.5)
CALCIUM: 6.4 mg/dL — AB (ref 8.4–10.5)
CHLORIDE: 100 meq/L (ref 96–112)
CHLORIDE: 100 meq/L (ref 96–112)
CHLORIDE: 104 meq/L (ref 96–112)
CO2: 17 mEq/L — ABNORMAL LOW (ref 19–32)
CO2: 16 mEq/L — ABNORMAL LOW (ref 19–32)
CO2: 18 mEq/L — ABNORMAL LOW (ref 19–32)
CO2: 16 mEq/L — ABNORMAL LOW (ref 19–32)
CO2: 16 mEq/L — ABNORMAL LOW (ref 19–32)
CO2: 20 meq/L (ref 19–32)
CO2: 18 meq/L — AB (ref 19–32)
CREATININE: 3.67 mg/dL — AB (ref 0.50–1.10)
CREATININE: 3.96 mg/dL — AB (ref 0.50–1.10)
CREATININE: 3.93 mg/dL — AB (ref 0.50–1.10)
Calcium: 6.6 mg/dL — ABNORMAL LOW (ref 8.4–10.5)
Calcium: 6.5 mg/dL — ABNORMAL LOW (ref 8.4–10.5)
Calcium: 6.6 mg/dL — ABNORMAL LOW (ref 8.4–10.5)
Chloride: 98 mEq/L (ref 96–112)
Chloride: 100 mEq/L (ref 96–112)
Chloride: 99 mEq/L (ref 96–112)
Chloride: 100 mEq/L (ref 96–112)
Creatinine, Ser: 3.72 mg/dL — ABNORMAL HIGH (ref 0.50–1.10)
Creatinine, Ser: 3.88 mg/dL — ABNORMAL HIGH (ref 0.50–1.10)
Creatinine, Ser: 3.74 mg/dL — ABNORMAL HIGH (ref 0.50–1.10)
Creatinine, Ser: 3.78 mg/dL — ABNORMAL HIGH (ref 0.50–1.10)
GFR calc Af Amer: 18 mL/min — ABNORMAL LOW (ref >90)
GFR calc Af Amer: 17 mL/min — ABNORMAL LOW (ref >90)
GFR calc Af Amer: 18 mL/min — ABNORMAL LOW (ref >90)
GFR calc Af Amer: 18 mL/min — ABNORMAL LOW (ref >90)
GFR calc Af Amer: 17 mL/min — ABNORMAL LOW (ref >90)
GFR calc Af Amer: 17 mL/min — ABNORMAL LOW (ref >90)
GFR calc non Af Amer: 16 mL/min — ABNORMAL LOW (ref >90)
GFR calc non Af Amer: 15 mL/min — ABNORMAL LOW (ref >90)
GFR calc non Af Amer: 16 mL/min — ABNORMAL LOW (ref >90)
GFR calc non Af Amer: 15 mL/min — ABNORMAL LOW (ref >90)
GFR calc non Af Amer: 15 mL/min — ABNORMAL LOW (ref >90)
GFR, EST AFRICAN AMERICAN: 19 mL/min — AB (ref >90)
GFR, EST NON AFRICAN AMERICAN: 16 mL/min — AB (ref >90)
GFR, EST NON AFRICAN AMERICAN: 15 mL/min — AB (ref >90)
GLUCOSE: 105 mg/dL — AB (ref 70–99)
GLUCOSE: 92 mg/dL (ref 70–99)
Glucose, Bld: 297 mg/dL — ABNORMAL HIGH (ref 70–99)
Glucose, Bld: 162 mg/dL — ABNORMAL HIGH (ref 70–99)
Glucose, Bld: 207 mg/dL — ABNORMAL HIGH (ref 70–99)
Glucose, Bld: 183 mg/dL — ABNORMAL HIGH (ref 70–99)
Glucose, Bld: 113 mg/dL — ABNORMAL HIGH (ref 70–99)
POTASSIUM: 3.8 meq/L (ref 3.7–5.3)
Potassium: 3.4 mEq/L — ABNORMAL LOW (ref 3.7–5.3)
Potassium: 2.7 mEq/L — CL (ref 3.7–5.3)
Potassium: 3.3 mEq/L — ABNORMAL LOW (ref 3.7–5.3)
Potassium: 3.5 mEq/L — ABNORMAL LOW (ref 3.7–5.3)
Potassium: 4.1 mEq/L (ref 3.7–5.3)
Potassium: 4.4 mEq/L (ref 3.7–5.3)
SODIUM: 138 meq/L (ref 137–147)
SODIUM: 137 meq/L (ref 137–147)
Sodium: 136 mEq/L — ABNORMAL LOW (ref 137–147)
Sodium: 139 mEq/L (ref 137–147)
Sodium: 138 mEq/L (ref 137–147)
Sodium: 137 mEq/L (ref 137–147)
Sodium: 140 mEq/L (ref 137–147)

## 2013-09-05 LAB — GLUCOSE, CAPILLARY
GLUCOSE-CAPILLARY: 130 mg/dL — AB (ref 70–99)
GLUCOSE-CAPILLARY: 134 mg/dL — AB (ref 70–99)
GLUCOSE-CAPILLARY: 139 mg/dL — AB (ref 70–99)
GLUCOSE-CAPILLARY: 198 mg/dL — AB (ref 70–99)
GLUCOSE-CAPILLARY: 205 mg/dL — AB (ref 70–99)
GLUCOSE-CAPILLARY: 214 mg/dL — AB (ref 70–99)
Glucose-Capillary: 259 mg/dL — ABNORMAL HIGH (ref 70–99)
Glucose-Capillary: 202 mg/dL — ABNORMAL HIGH (ref 70–99)
Glucose-Capillary: 140 mg/dL — ABNORMAL HIGH (ref 70–99)
Glucose-Capillary: 101 mg/dL — ABNORMAL HIGH (ref 70–99)
Glucose-Capillary: 89 mg/dL (ref 70–99)
Glucose-Capillary: 230 mg/dL — ABNORMAL HIGH (ref 70–99)
Glucose-Capillary: 230 mg/dL — ABNORMAL HIGH (ref 70–99)
Glucose-Capillary: 152 mg/dL — ABNORMAL HIGH (ref 70–99)
Glucose-Capillary: 121 mg/dL — ABNORMAL HIGH (ref 70–99)
Glucose-Capillary: 117 mg/dL — ABNORMAL HIGH (ref 70–99)
Glucose-Capillary: 245 mg/dL — ABNORMAL HIGH (ref 70–99)
Glucose-Capillary: 172 mg/dL — ABNORMAL HIGH (ref 70–99)
Glucose-Capillary: 100 mg/dL — ABNORMAL HIGH (ref 70–99)

## 2013-09-05 LAB — BLOOD GAS, ARTERIAL
ACID-BASE DEFICIT: 4.5 mmol/L — AB (ref 0.0–2.0)
Acid-base deficit: 5.6 mmol/L — ABNORMAL HIGH (ref 0.0–2.0)
Bicarbonate: 17.1 mEq/L — ABNORMAL LOW (ref 20.0–24.0)
Bicarbonate: 20.7 mEq/L (ref 20.0–24.0)
DRAWN BY: 235321
DRAWN BY: 103701
FIO2: 0.3 %
FIO2: 1 %
LHR: 30 {breaths}/min
O2 SAT: 98.3 %
O2 Saturation: 96.3 %
PATIENT TEMPERATURE: 36.8
PCO2 ART: 41.9 mmHg (ref 35.0–45.0)
PEEP/CPAP: 5 cmH2O
PEEP/CPAP: 5 cmH2O
PO2 ART: 101 mmHg — AB (ref 80.0–100.0)
PRESSURE CONTROL: 20 cmH2O
Patient temperature: 99
Pressure control: 20 cmH2O
RATE: 24 resp/min
TCO2: 16.1 mmol/L (ref 0–100)
TCO2: 19.8 mmol/L (ref 0–100)
pCO2 arterial: 24.4 mmHg — ABNORMAL LOW (ref 35.0–45.0)
pH, Arterial: 7.457 — ABNORMAL HIGH (ref 7.350–7.450)
pH, Arterial: 7.316 — ABNORMAL LOW (ref 7.350–7.450)
pO2, Arterial: 162 mmHg — ABNORMAL HIGH (ref 80.0–100.0)

## 2013-09-05 LAB — PROCALCITONIN: Procalcitonin: 39.9 ng/mL

## 2013-09-05 LAB — LACTIC ACID, PLASMA: Lactic Acid, Venous: 2.5 mmol/L — ABNORMAL HIGH (ref 0.5–2.2)

## 2013-09-05 MED ORDER — POTASSIUM CHLORIDE 10 MEQ/50ML IV SOLN
10.0000 meq | INTRAVENOUS | Status: AC
  Administered 2013-09-05 (×6): 10 meq via INTRAVENOUS
  Filled 2013-09-05: qty 50

## 2013-09-05 MED ORDER — SODIUM CHLORIDE 0.9 % IV SOLN
INTRAVENOUS | Status: DC
Start: 2013-09-05 — End: 2013-09-11
  Administered 2013-09-05 – 2013-09-09 (×2): 500 mL via INTRAVENOUS
  Administered 2013-09-10: 23:00:00 via INTRAVENOUS

## 2013-09-05 NOTE — Progress Notes (Signed)
  Echocardiogram 2D Echocardiogram has been performed.  Arvil ChacoFoster, Layci Stenglein 09/05/2013, 10:10 AM

## 2013-09-05 NOTE — Progress Notes (Signed)
Name: Guerry MinorsJacqueline Bourcier MRN: [id number] DOB: 05/29/1986    ADMISSION DATE:  09/03/2013 CONSULTATION DATE:  3/19   REFERRING MD :  Jodi Mourningzavitz  PRIMARY SERVICE: pccm   CHIEF COMPLAINT:  dka and shock   BRIEF PATIENT DESCRIPTION:  28 year old diabetic female. Found unresponsive on 3/19. Initial glucose 1594, WBC 21, PH un-measurable, +serum Ketones, lactic acid >11 w/  early rll infiltrate. PCCM asked to admit for DKA and possible septic shock.   SIGNIFICANT EVENTS / STUDIES:  UDS 3/19>>neg  LINES / TUBES: oett 3/19>>> Right fem CVL (ER) 3/19 >>>  CULTURES: BCX2 3/19>>> UC 3/19>>> Sputum 3/19>>>  ANTIBIOTICS: levaquin 3/19>>> 3/20 Rocephin 3/19>>> 3/20 vanco 3/20 >>  Pip/tazo 3/20 >>    SUBJECTIVE:  Has waked some and followed commands Remains on norepi but weaing  VITAL SIGNS: Temp:  [98.2 F (36.8 C)-100.2 F (37.9 C)] 99 F (37.2 C) (03/21 1000) Pulse Rate:  [97-123] 101 (03/21 1000) Resp:  [27-32] 30 (03/21 1000) BP: (100-123)/(42-62) 102/62 mmHg (03/21 0800) SpO2:  [100 %] 100 % (03/21 1000) Arterial Line BP: (91-146)/(46-68) 109/64 mmHg (03/21 1000) FiO2 (%):  [30 %] 30 % (03/21 0933) HEMODYNAMICS: CVP:  [14 mmHg-16 mmHg] 16 mmHg VENTILATOR SETTINGS: Vent Mode:  [-] PCV FiO2 (%):  [30 %] 30 % Set Rate:  [30 bmp] 30 bmp PEEP:  [5 cmH20] 5 cmH20 Plateau Pressure:  [19 cmH20-22 cmH20] 20 cmH20 INTAKE / OUTPUT: Intake/Output     03/20 0701 - 03/21 0700 03/21 0701 - 03/22 0700   I.V. (mL/kg) 6144.2 (106.5) 622.9 (10.8)   Other 50 160   NG/GT 140 60   IV Piggyback 1085 150   Total Intake(mL/kg) 7419.2 (128.6) 992.9 (17.2)   Urine (mL/kg/hr) 2015 (1.5) 515 (2.2)   Emesis/NG output 175 (0.1)    Total Output 2190 515   Net +5229.2 +477.9          PHYSICAL EXAMINATION: General:  Sedated on vent HEENT: NCAT, ETT in place, large tongue without evidence bite trauma PULM: Crackles bilaterally CV: Tachy, regular AB: distended, tender, BS+ Ext: warm,  trace edema noted Neuro: sedated on vent  LABS:  CBC  Recent Labs Lab 09/03/13 1820 09/03/13 2003 09/05/13 0442  WBC 21.0* 9.8 10.1  HGB 10.1* 8.2* 8.3*  HCT 37.1 27.3* 22.8*  PLT 378 222 161   Coag's No results found for this basename: APTT, INR,  in the last 168 hours BMET  Recent Labs Lab 09/05/13 0442 09/05/13 0644 09/05/13 0832  NA 138 138 137  K 3.3* 3.8 3.5*  CL 99 100 100  CO2 18* 18* 16*  BUN 60* 58* 59*  CREATININE 3.88* 3.74* 3.78*  GLUCOSE 105* 207* 183*   Electrolytes  Recent Labs Lab 09/03/13 2003  09/05/13 0442 09/05/13 0644 09/05/13 0832  CALCIUM 6.8*  < > 6.6* 6.5* 6.4*  MG 2.6*  --   --   --   --   < > = values in this interval not displayed. Sepsis Markers  Recent Labs Lab 09/03/13 2003 09/03/13 2359 09/04/13 0405 09/04/13 1440 09/05/13 0430 09/05/13 0646  LATICACIDVEN  --  7.7*  --  8.6*  --  2.5*  PROCALCITON 5.60  --  29.92  --  39.90  --    ABG  Recent Labs Lab 09/04/13 0740 09/04/13 1500 09/05/13 0345  PHART 7.155* 7.421 7.457*  PCO2ART 33.7* 26.4* 24.4*  PO2ART 85.4 124.0* 162.0*   Liver Enzymes  Recent Labs Lab 09/03/13 1820  AST  193*  ALT 93*  ALKPHOS 118*  BILITOT 0.4  ALBUMIN 3.4*   Cardiac Enzymes  Recent Labs Lab 09/03/13 2359  TROPONINI <0.30   Glucose  Recent Labs Lab 09/04/13 2312 09/05/13 0002 09/05/13 0101 09/05/13 0203 09/05/13 0306 09/05/13 0406  GLUCAP 139* 214* 259* 202* 140* 101*    Imaging    CXR:  ett good position.   ASSESSMENT / PLAN:  PULMONARY A: Acute respiratory failure in setting of ams and profound acidosis  On PS ventilation P:   Continue PCV for now as she is comfortable and ABG is improving, will decrease RR with normalization pH Close obs of abg Daily SBT/WUA/CXR/ABG   CARDIOVASCULAR A:  Circulatory shock: sepsis vs cardiogenic dysfxn in setting of profound acidosis       Currently euvolemic to volume up      Bradycardia in setting of acidosis  and hyperkalemia  P:  Continue levophed, wean for MAP > 65 F/U cortisol  Echo pending Repeat lactic acid is improved 3/21  RENAL A:  AKI, Oliguric due to hypovolemia and shock, S Cr seems to be at plateau DKA with metabolic acidosis improving Hypokalemia P:   Monitor CVP Follow BMET/ABG   GASTROINTESTINAL A:  Diabetic gastroparesis  Distended abdomen with tenderness P:   ppi  Follow exam   HEMATOLOGIC A:  Anemia  P:  [place] heparin  F/u cbc   INFECTIOUS A: Septic shock, source uncertain P:   Pan culture  Changed ceftriaxone/levaquin to zosyn > abdominal source? continue vanc for now  ENDOCRINE A:  DKA      DM w/ Hyperglycemia  P:   dka protocol   NEUROLOGIC A: acute encephalopathy in setting of DKA   P:   Continue fentanyl/propofol for now  Family : no family here 3/21 CODE: full  I have personally obtained a history, examined the patient, evaluated laboratory and imaging results, formulated the assessment and plan and placed orders. CRITICAL CARE: The patient is critically ill with multiple organ systems failure and requires high complexity decision making for assessment and support, frequent evaluation and titration of therapies, application of advanced monitoring technologies and extensive interpretation of multiple databases. Critical Care Time devoted to patient care services described in this note is 45  minutes.   Levy Pupa, MD, PhD 09/05/2013, 11:13 AM [facility] [phone] or if no answer [phone]

## 2013-09-05 NOTE — Procedures (Signed)
CPR Note  Called urgently to pt's room for acute onset wide complex tachycardia. Consistant with VT, less likely SVT. Norepi was turned off due to profound hypertension. She spontaneously converted back to sinus tachy. Amiodarone 150mg  IV given. She then had steady drop in BP to PEA (still sinus tachy). ACLS was initiated > received epi, bicarb, calcium. CPR performed immediately, lasted for 2-3 minutes. Norepi was turned back on. Pulse achieved and then norepi weaning initiated.   Plans:  - check ABG, CXR now  Levy Pupaobert Katria Botts, MD, PhD 09/05/2013, 11:43 AM [facility] [phone] or if no answer [phone]

## 2013-09-05 NOTE — Progress Notes (Addendum)
Nalleli Bodin 1239 1115AM PATIENT WENT INTO VTACH WITH A PULSE. Flavia ShipperS. DILLON, MGwinda Passe. Shiara Mcgough, AND DIANE NT WERE IN THE ROOM. DR Delton CoombesBYRUM WAS HERE AND CAME IN ROOM. AMINO. BOLUS GIVEN. CODE CALLED. LOST PULSE. SHE WENT INTO ST-[facility] -NO PULSE. EPI. GIVEN, CALCIUM GIVEN, AND NA. BICAR. GIVEN. ST. B/P LOW. LEVOPHED RESTARTED. SEDATION HAD BEEN STOPPED. PROPOFOL RESTARTED 20MCG. FENTANYL 25MCG. COMPRESSIONS WERE DONE- PATIENT WAS TAKEN OFF VENT AND BAGGED DURING CODE.1130AM ST 150'S WITH PULSE.

## 2013-09-05 NOTE — Progress Notes (Signed)
CRITICAL VALUE ALERT  Critical value received:  K+ 2.7/ Ca 6.2  Date of notification:  09/05/2013  Time of notification:  0340am  Critical value read back: yes  Nurse who received alert:  Conley RollsGarland, Fayetta Sorenson Lavern, RN  MD notified (1st page):  E.Link,MD  Time of first page:  0341  MD notified (2nd page):  Time of second page:  Responding MD:  Glade NurseE.Link ,MD  Time MD responded:

## 2013-09-06 ENCOUNTER — Inpatient Hospital Stay (HOSPITAL_COMMUNITY): Payer: Medicare Other

## 2013-09-06 LAB — CULTURE, RESPIRATORY: GRAM STAIN: NONE SEEN

## 2013-09-06 LAB — BASIC METABOLIC PANEL
BUN: 53 mg/dL — AB (ref 6–23)
BUN: 52 mg/dL — ABNORMAL HIGH (ref 6–23)
CALCIUM: 7.7 mg/dL — AB (ref 8.4–10.5)
CO2: 20 mEq/L (ref 19–32)
CO2: 20 mEq/L (ref 19–32)
CREATININE: 3.99 mg/dL — AB (ref 0.50–1.10)
Calcium: 7.5 mg/dL — ABNORMAL LOW (ref 8.4–10.5)
Chloride: 103 mEq/L (ref 96–112)
Chloride: 108 mEq/L (ref 96–112)
Creatinine, Ser: 4.16 mg/dL — ABNORMAL HIGH (ref 0.50–1.10)
GFR calc Af Amer: 17 mL/min — ABNORMAL LOW (ref >90)
GFR calc non Af Amer: 14 mL/min — ABNORMAL LOW (ref >90)
GFR, EST AFRICAN AMERICAN: 16 mL/min — AB (ref >90)
GFR, EST NON AFRICAN AMERICAN: 14 mL/min — AB (ref >90)
Glucose, Bld: 137 mg/dL — ABNORMAL HIGH (ref 70–99)
Glucose, Bld: 72 mg/dL (ref 70–99)
Potassium: 4.3 mEq/L (ref 3.7–5.3)
Potassium: 4.3 mEq/L (ref 3.7–5.3)
SODIUM: 145 meq/L (ref 137–147)
Sodium: 140 mEq/L (ref 137–147)

## 2013-09-06 LAB — GLUCOSE, CAPILLARY
GLUCOSE-CAPILLARY: 100 mg/dL — AB (ref 70–99)
GLUCOSE-CAPILLARY: 130 mg/dL — AB (ref 70–99)
GLUCOSE-CAPILLARY: 146 mg/dL — AB (ref 70–99)
GLUCOSE-CAPILLARY: 147 mg/dL — AB (ref 70–99)
GLUCOSE-CAPILLARY: 157 mg/dL — AB (ref 70–99)
GLUCOSE-CAPILLARY: 215 mg/dL — AB (ref 70–99)
GLUCOSE-CAPILLARY: 218 mg/dL — AB (ref 70–99)
GLUCOSE-CAPILLARY: 81 mg/dL (ref 70–99)
GLUCOSE-CAPILLARY: 95 mg/dL (ref 70–99)
Glucose-Capillary: 102 mg/dL — ABNORMAL HIGH (ref 70–99)
Glucose-Capillary: 150 mg/dL — ABNORMAL HIGH (ref 70–99)
Glucose-Capillary: 247 mg/dL — ABNORMAL HIGH (ref 70–99)
Glucose-Capillary: 245 mg/dL — ABNORMAL HIGH (ref 70–99)
Glucose-Capillary: 171 mg/dL — ABNORMAL HIGH (ref 70–99)
Glucose-Capillary: 102 mg/dL — ABNORMAL HIGH (ref 70–99)
Glucose-Capillary: 224 mg/dL — ABNORMAL HIGH (ref 70–99)
Glucose-Capillary: 141 mg/dL — ABNORMAL HIGH (ref 70–99)
Glucose-Capillary: 101 mg/dL — ABNORMAL HIGH (ref 70–99)
Glucose-Capillary: 175 mg/dL — ABNORMAL HIGH (ref 70–99)
Glucose-Capillary: 109 mg/dL — ABNORMAL HIGH (ref 70–99)
Glucose-Capillary: 67 mg/dL — ABNORMAL LOW (ref 70–99)
Glucose-Capillary: 87 mg/dL (ref 70–99)
Glucose-Capillary: 53 mg/dL — ABNORMAL LOW (ref 70–99)
Glucose-Capillary: 85 mg/dL (ref 70–99)
Glucose-Capillary: 119 mg/dL — ABNORMAL HIGH (ref 70–99)
Glucose-Capillary: 210 mg/dL — ABNORMAL HIGH (ref 70–99)
Glucose-Capillary: 220 mg/dL — ABNORMAL HIGH (ref 70–99)
Glucose-Capillary: 143 mg/dL — ABNORMAL HIGH (ref 70–99)
Glucose-Capillary: 97 mg/dL (ref 70–99)

## 2013-09-06 LAB — DIC (DISSEMINATED INTRAVASCULAR COAGULATION) PANEL
D DIMER QUANT: 1.99 ug{FEU}/mL — AB (ref 0.00–0.48)
FIBRINOGEN: 472 mg/dL (ref 204–475)
PLATELETS: 93 10*3/uL — AB (ref 150–400)
SMEAR REVIEW: NONE SEEN

## 2013-09-06 LAB — BLOOD GAS, ARTERIAL
Acid-base deficit: 3.7 mmol/L — ABNORMAL HIGH (ref 0.0–2.0)
BICARBONATE: 20.3 meq/L (ref 20.0–24.0)
Drawn by: 103701
FIO2: 0.3 %
O2 Saturation: 97.1 %
PEEP: 5 cmH2O
PH ART: 7.379 (ref 7.350–7.450)
PO2 ART: 107 mmHg — AB (ref 80.0–100.0)
PRESSURE CONTROL: 20 cmH2O
Patient temperature: 98.6
RATE: 20 resp/min
TCO2: 18.8 mmol/L (ref 0–100)
pCO2 arterial: 35.3 mmHg (ref 35.0–45.0)

## 2013-09-06 LAB — DIC (DISSEMINATED INTRAVASCULAR COAGULATION)PANEL
INR: 1.16 (ref 0.00–1.49)
Prothrombin Time: 14.6 seconds (ref 11.6–15.2)
aPTT: 51 seconds — ABNORMAL HIGH (ref 24–37)

## 2013-09-06 LAB — CBC
HCT: 20 % — ABNORMAL LOW (ref 36.0–46.0)
Hemoglobin: 7.3 g/dL — ABNORMAL LOW (ref 12.0–15.0)
MCH: 30.9 pg (ref 26.0–34.0)
MCHC: 36.5 g/dL — ABNORMAL HIGH (ref 30.0–36.0)
MCV: 84.7 fL (ref 78.0–100.0)
Platelets: 118 10*3/uL — ABNORMAL LOW (ref 150–400)
RBC: 2.36 MIL/uL — AB (ref 3.87–5.11)
RDW: 15 % (ref 11.5–15.5)
WBC: 7.4 10*3/uL (ref 4.0–10.5)

## 2013-09-06 MED ORDER — DEXTROSE 50 % IV SOLN
INTRAVENOUS | Status: AC
  Filled 2013-09-06: qty 50

## 2013-09-06 NOTE — Progress Notes (Signed)
Name: [name] Ross MRN: [id number] DOB: 04-Jan-1986    ADMISSION DATE:  09/03/2013 CONSULTATION DATE:  3/19   REFERRING MD :  Jodi Mourning  PRIMARY SERVICE: pccm   CHIEF COMPLAINT:  dka and shock   BRIEF PATIENT DESCRIPTION:  28 year old diabetic female. Found unresponsive on 3/19. Initial glucose 1594, WBC 21, PH un-measurable, +serum Ketones, lactic acid >11 w/  early rll infiltrate. PCCM asked to admit for DKA and possible septic shock.   SIGNIFICANT EVENTS / STUDIES:  UDS 3/19>>neg TTE 3/21 >> intact LV fxn without evidence PAH  LINES / TUBES: oett 3/19>>> Right fem CVL (ER) 3/19 >>>  CULTURES: BCX2 3/19>>> UC 3/19>>> Sputum 3/19>>>  ANTIBIOTICS: levaquin 3/19>>> 3/20 Rocephin 3/19>>> 3/20 vanco 3/20 >>  Pip/tazo 3/20 >>    SUBJECTIVE:  Episode of VT 3/21, spontaneously back to sinus after about 3 minutes. Then PEA with brief CPR.  Opens eyes and nods to questions today but apneic when placed on PSV  VITAL SIGNS: Temp:  [97.7 F (36.5 C)-99.7 F (37.6 C)] 97.7 F (36.5 C) (03/22 0900) Pulse Rate:  [82-111] 95 (03/22 0900) Resp:  [14-31] 29 (03/22 0900) BP: (82-120)/(40-78) 107/78 mmHg (03/22 0800) SpO2:  [96 %-100 %] 100 % (03/22 0900) Arterial Line BP: (92-161)/(53-93) 108/68 mmHg (03/22 0900) FiO2 (%):  [30 %-100 %] 30 % (03/22 0910) HEMODYNAMICS: CVP:  [8 mmHg-16 mmHg] 8 mmHg VENTILATOR SETTINGS: Vent Mode:  [-] PCV FiO2 (%):  [30 %-100 %] 30 % Set Rate:  [24 bmp] 24 bmp PEEP:  [5 cmH20] 5 cmH20 Plateau Pressure:  [18 cmH20-21 cmH20] 18 cmH20 INTAKE / OUTPUT: Intake/Output     03/21 0701 - 03/22 0700 03/22 0701 - 03/23 0700   I.V. (mL/kg) 4388.7 (76.1) 375.6 (6.5)   Other 470    NG/GT 180    IV Piggyback 387.5 25   Total Intake(mL/kg) 5426.2 (94) 400.6 (6.9)   Urine (mL/kg/hr) 3850 (2.8) 500 (3.4)   Emesis/NG output 410 (0.3)    Total Output 4260 500   Net +1166.2 -99.4          PHYSICAL EXAMINATION: General:  Ill appearing,  comfortable on current sedation HEENT: NCAT, ETT in place, large tongue without evidence bite trauma PULM: Crackles bilaterally CV: Tachy, regular AB: distended, tender, BS+ Ext: warm, trace edema noted Neuro: sedated on vent  LABS:  CBC  Recent Labs Lab 09/03/13 2003 09/05/13 0442 09/06/13 0220  WBC 9.8 10.1 7.4  HGB 8.2* 8.3* 7.3*  HCT 27.3* 22.8* 20.0*  PLT 222 161 118*   Coag's No results found for this basename: APTT, INR,  in the last 168 hours BMET  Recent Labs Lab 09/05/13 1020 09/05/13 1740 09/06/13 0220  NA 137 140 140  K 4.1 4.4 4.3  CL 100 104 103  CO2 16* 20 20  BUN 58* 56* 53*  CREATININE 3.93* 3.96* 3.99*  GLUCOSE 113* 92 137*   Electrolytes  Recent Labs Lab 09/03/13 2003  09/05/13 1020 09/05/13 1740 09/06/13 0220  CALCIUM 6.8*  < > 6.6* 6.9* 7.5*  MG 2.6*  --   --   --   --   < > = values in this interval not displayed. Sepsis Markers  Recent Labs Lab 09/03/13 2003 09/03/13 2359 09/04/13 0405 09/04/13 1440 09/05/13 0430 09/05/13 0646  LATICACIDVEN  --  7.7*  --  8.6*  --  2.5*  PROCALCITON 5.60  --  29.92  --  39.90  --    ABG  Recent  Labs Lab 09/04/13 1500 09/05/13 0345 09/05/13 1153  PHART 7.421 7.457* 7.316*  PCO2ART 26.4* 24.4* 41.9  PO2ART 124.0* 162.0* 101.0*   Liver Enzymes  Recent Labs Lab 09/03/13 1820  AST 193*  ALT 93*  ALKPHOS 118*  BILITOT 0.4  ALBUMIN 3.4*   Cardiac Enzymes  Recent Labs Lab 09/03/13 2359  TROPONINI <0.30   Glucose  Recent Labs Lab 09/06/13 0239 09/06/13 0344 09/06/13 0449 09/06/13 0554 09/06/13 0658 09/06/13 0805  GLUCAP 146* 224* 218* 141* 101* 100*    Imaging Dg Chest Port 1 View  09/06/2013   CLINICAL DATA:  Endotracheal tube.  EXAM: PORTABLE CHEST - 1 VIEW  COMPARISON:  September 05, 2013.  FINDINGS: Endotracheal tube is in grossly good position with distal tip 3 cm above the carina. Nasogastric tube is seen entering the stomach. No pneumothorax or pleural effusion  is noted. Mild right basilar opacity is noted consistent with subsegmental atelectasis or pneumonia. Bony thorax is intact.  IMPRESSION: Mild right basilar opacity consistent with subsegmental atelectasis or pneumonia. Endotracheal and nasogastric tubes are in grossly good position.   Electronically Signed   By: Roque Lias M.D.   On: 09/06/2013 08:45   Dg Chest Port 1 View  09/05/2013   CLINICAL DATA:  28 year old female - code blue.  EXAM: PORTABLE CHEST - 1 VIEW  COMPARISON:  09/05/2013 and prior chest radiographs dating back to 10/18/2011  FINDINGS: Upper limits normal heart size identified.  Mild interstitial pulmonary edema is present.  An endotracheal tube with tip 1.7 cm above the carina, NG tube with tip overlying the mid stomach, and defibrillator pads overlying the chest identified.  There is no evidence of large pleural effusion or pneumothorax.  IMPRESSION: Mild pulmonary edema.  Support apparatus as described.   Electronically Signed   By: Laveda Abbe M.D.   On: 09/05/2013 15:54   Dg Chest Port 1 View  09/05/2013   CLINICAL DATA:  Endotracheal tube position.  EXAM: PORTABLE CHEST - 1 VIEW  COMPARISON:  September 04, 2013.  FINDINGS: Cardiomediastinal silhouette appears normal. Endotracheal tube is in grossly good position with distal tip 2 cm above the carina. No acute pulmonary disease is noted. Nasogastric tube is seen entering the stomach. No pneumothorax or pleural effusion is noted.  IMPRESSION: Endotracheal tube in grossly good position. No acute cardiopulmonary abnormality seen.   Electronically Signed   By: Roque Lias M.D.   On: 09/05/2013 07:32   Dg Abd Portable 1v  09/04/2013   CLINICAL DATA:  Septic shock  EXAM: PORTABLE ABDOMEN - 1 VIEW  COMPARISON:  Oct 18, 2011  FINDINGS: Nasogastric tube tip and side port are in the stomach. There is a right femoral catheter with the tip at L3-4 level, presumably in the inferior vena cava. There is a rectal thermometer.  There is moderate stool  throughout colon. The bowel gas pattern is unremarkable. No obstruction or free air is seen on this supine examination. There are no abnormal calcifications.  IMPRESSION: Tube and catheter positions as described. Bowel gas pattern unremarkable.   Electronically Signed   By: Bretta Bang M.D.   On: 09/04/2013 11:22     ASSESSMENT / PLAN:  PULMONARY A: Acute respiratory failure in setting of ams and profound acidosis  P:   Continue PCV, will decrease RR with normalization pH Close obs of abg Daily SBT/WUA/CXR/ABG   CARDIOVASCULAR A:  Circulatory shock: sepsis vs cardiogenic dysfxn in setting of profound acidosis. TTE 3/21 shows intact LV  fxn without evidence PAH      Currently euvolemic to volume up      Bradycardia in setting of acidosis and hyperkalemia       VT on 3/21, received amio bolus x 1 P:  Continue levophed, weaning for MAP > 65 F/U cortisol   RENAL A:  AKI, Oliguric due to hypovolemia and shock, S Cr seems to be at plateau DKA with metabolic acidosis improving Hypokalemia P:   Monitor CVP Follow BMET/ABG   GASTROINTESTINAL A:  Diabetic gastroparesis  Distended abdomen with tenderness P:   ppi  Follow exam   HEMATOLOGIC A:  Anemia  Evolving thrombocytopenia P:  Stop heparin 3/22 Send HITT and DIC panels 3/22 F/u cbc   INFECTIOUS A: Septic shock, source uncertain P:   Pan culture  Changed ceftriaxone/levaquin to zosyn > abdominal source? continue vanc for now  ENDOCRINE A:  DKA      DM w/ Hyperglycemia  P:   dka protocol   NEUROLOGIC A: acute encephalopathy in setting of DKA   P:   Continue fentanyl/propofol for now  Family : no family here 3/21 or 3/22 CODE: full  I have personally obtained a history, examined the patient, evaluated laboratory and imaging results, formulated the assessment and plan and placed orders. CRITICAL CARE: The patient is critically ill with multiple organ systems failure and requires high complexity  decision making for assessment and support, frequent evaluation and titration of therapies, application of advanced monitoring technologies and extensive interpretation of multiple databases. Critical Care Time devoted to patient care services described in this note is 45  minutes.   Levy Pupaobert Affie Gasner, MD, PhD 09/06/2013, 9:35 AM [facility] [phone] or if no answer [phone]

## 2013-09-07 LAB — BASIC METABOLIC PANEL
BUN: 48 mg/dL — ABNORMAL HIGH (ref 6–23)
BUN: 46 mg/dL — AB (ref 6–23)
BUN: 46 mg/dL — ABNORMAL HIGH (ref 6–23)
CALCIUM: 8.1 mg/dL — AB (ref 8.4–10.5)
CHLORIDE: 106 meq/L (ref 96–112)
CO2: 19 mEq/L (ref 19–32)
CO2: 20 mEq/L (ref 19–32)
CO2: 19 meq/L (ref 19–32)
CREATININE: 4.11 mg/dL — AB (ref 0.50–1.10)
Calcium: 8 mg/dL — ABNORMAL LOW (ref 8.4–10.5)
Calcium: 8.1 mg/dL — ABNORMAL LOW (ref 8.4–10.5)
Chloride: 106 mEq/L (ref 96–112)
Chloride: 104 mEq/L (ref 96–112)
Creatinine, Ser: 4.14 mg/dL — ABNORMAL HIGH (ref 0.50–1.10)
Creatinine, Ser: 4.07 mg/dL — ABNORMAL HIGH (ref 0.50–1.10)
GFR calc Af Amer: 16 mL/min — ABNORMAL LOW (ref >90)
GFR calc Af Amer: 16 mL/min — ABNORMAL LOW (ref >90)
GFR calc non Af Amer: 14 mL/min — ABNORMAL LOW (ref >90)
GFR, EST AFRICAN AMERICAN: 16 mL/min — AB (ref >90)
GFR, EST NON AFRICAN AMERICAN: 14 mL/min — AB (ref >90)
GFR, EST NON AFRICAN AMERICAN: 14 mL/min — AB (ref >90)
Glucose, Bld: 190 mg/dL — ABNORMAL HIGH (ref 70–99)
Glucose, Bld: 151 mg/dL — ABNORMAL HIGH (ref 70–99)
Glucose, Bld: 281 mg/dL — ABNORMAL HIGH (ref 70–99)
POTASSIUM: 4.2 meq/L (ref 3.7–5.3)
POTASSIUM: 4.2 meq/L (ref 3.7–5.3)
Potassium: 4 mEq/L (ref 3.7–5.3)
SODIUM: 143 meq/L (ref 137–147)
Sodium: 142 mEq/L (ref 137–147)
Sodium: 143 mEq/L (ref 137–147)

## 2013-09-07 LAB — CBC
HCT: 21.7 % — ABNORMAL LOW (ref 36.0–46.0)
HEMOGLOBIN: 7.6 g/dL — AB (ref 12.0–15.0)
MCH: 31 pg (ref 26.0–34.0)
MCHC: 35 g/dL (ref 30.0–36.0)
MCV: 88.6 fL (ref 78.0–100.0)
Platelets: 88 10*3/uL — ABNORMAL LOW (ref 150–400)
RBC: 2.45 MIL/uL — ABNORMAL LOW (ref 3.87–5.11)
RDW: 15.5 % (ref 11.5–15.5)
WBC: 6 10*3/uL (ref 4.0–10.5)

## 2013-09-07 LAB — GLUCOSE, CAPILLARY
GLUCOSE-CAPILLARY: 88 mg/dL (ref 70–99)
Glucose-Capillary: 66 mg/dL — ABNORMAL LOW (ref 70–99)
Glucose-Capillary: 70 mg/dL (ref 70–99)
Glucose-Capillary: 78 mg/dL (ref 70–99)
Glucose-Capillary: 84 mg/dL (ref 70–99)

## 2013-09-07 LAB — PARATHYROID HORMONE, INTACT (NO CA): PTH: 153.1 pg/mL — ABNORMAL HIGH (ref 14.0–72.0)

## 2013-09-07 LAB — PHOSPHORUS: Phosphorus: 4.1 mg/dL (ref 2.3–4.6)

## 2013-09-07 LAB — MAGNESIUM: MAGNESIUM: 1.4 mg/dL — AB (ref 1.5–2.5)

## 2013-09-07 LAB — VANCOMYCIN, TROUGH: VANCOMYCIN TR: 24.1 ug/mL — AB (ref 10.0–20.0)

## 2013-09-07 MED ORDER — PIPERACILLIN-TAZOBACTAM IN DEX 2-0.25 GM/50ML IV SOLN
2.2500 g | Freq: Four times a day (QID) | INTRAVENOUS | Status: DC
  Administered 2013-09-07 – 2013-09-10 (×12): 2.25 g via INTRAVENOUS
  Filled 2013-09-07 (×13): qty 50

## 2013-09-07 MED ORDER — INSULIN ASPART 100 UNIT/ML ~~LOC~~ SOLN
1.0000 [IU] | SUBCUTANEOUS | Status: DC
  Administered 2013-09-07: 3 [IU] via SUBCUTANEOUS
  Administered 2013-09-07 – 2013-09-08 (×2): 2 [IU] via SUBCUTANEOUS
  Administered 2013-09-08 – 2013-09-09 (×3): 1 [IU] via SUBCUTANEOUS

## 2013-09-07 MED ORDER — MAGNESIUM SULFATE 40 MG/ML IJ SOLN
2.0000 g | Freq: Once | INTRAMUSCULAR | Status: AC
  Administered 2013-09-07: 2 g via INTRAVENOUS
  Filled 2013-09-07: qty 50

## 2013-09-07 MED ORDER — INSULIN GLARGINE 100 UNIT/ML ~~LOC~~ SOLN
10.0000 [IU] | SUBCUTANEOUS | Status: DC
  Administered 2013-09-07: 10 [IU] via SUBCUTANEOUS
  Filled 2013-09-07 (×2): qty 0.1

## 2013-09-07 NOTE — Progress Notes (Signed)
eLink Physician-Brief Progress Note Patient Name: [name] MinorsJacqueline Esquivel DOB: 11/16/1985 MRN: [id number]  Date of Service  09/07/2013   HPI/Events of Note   CBG low  eICU Interventions  D/c lantus   Intervention Category Intermediate Interventions:  (hypoglycemia)  Shan LevansPatrick Wright 09/07/2013, 8:12 PM

## 2013-09-07 NOTE — Progress Notes (Signed)
ANTIBIOTIC CONSULT NOTE - FOLLOW UP  Pharmacy Consult for vancomycin and zosyn Indication: r/o sepsis secondary to suspected intra-abdominal infection vs pneumonia  No Known Allergies  Patient Measurements: Height: 5\' 5"  (165.1 cm) Weight: 150 lb 9.2 oz (68.3 kg) IBW/kg (Calculated) : 57 Adjusted Body Weight:   Vital Signs: Temp: 97.5 F (36.4 C) (03/23 6962) Temp src: Core (Comment) (03/23 0800) Pulse Rate: 81 (03/23 0833) Intake/Output from previous day: 03/22 0701 - 03/23 0700 In: 4621 [I.V.:4346; IV Piggyback:275] Out: 5225 [Urine:3725; Emesis/NG output:1500] Intake/Output from this shift: Total I/O In: 271.3 [I.V.:191.3; NG/GT:30; IV Piggyback:50] Out: -   Labs:  Recent Labs  09/05/13 0442  09/06/13 0220 09/06/13 1111 09/06/13 1415 09/07/13 0230  WBC 10.1  --  7.4  --   --  6.0  HGB 8.3*  --  7.3*  --   --  7.6*  PLT 161  --  118* 93*  --  88*  CREATININE 3.88*  < > 3.99*  --  4.16* 4.14*  < > = values in this interval not displayed. Estimated Creatinine Clearance: 18.5 ml/min (by C-G formula based on Cr of 4.14). No results found for this basename: VANCOTROUGH, VANCOPEAK, VANCORANDOM, GENTTROUGH, GENTPEAK, GENTRANDOM, TOBRATROUGH, TOBRAPEAK, TOBRARND, AMIKACINPEAK, AMIKACINTROU, AMIKACIN,  in the last 72 hours   Microbiology: Recent Results (from the past 720 hour(s))  CULTURE, BLOOD (ROUTINE X 2)     Status: None   Collection Time    09/03/13  7:00 PM      Result Value Ref Range Status   Specimen Description BLOOD FEMORAL   Final   Special Requests BOTTLES DRAWN AEROBIC AND ANAEROBIC   Final   Culture  Setup Time     Final   Value: 09/04/2013 02:11     Performed at [facility]   Culture     Final   Value:        BLOOD CULTURE RECEIVED NO GROWTH TO DATE CULTURE WILL BE HELD FOR 5 DAYS BEFORE ISSUING A FINAL NEGATIVE REPORT     Performed at [facility]   Report Status PENDING   Incomplete  CULTURE, BLOOD (ROUTINE X 2)     Status:  None   Collection Time    09/03/13  8:03 PM      Result Value Ref Range Status   Specimen Description BLOOD RIGHT ARM   Final   Special Requests BOTTLES DRAWN AEROBIC AND ANAEROBIC 4CC   Final   Culture  Setup Time     Final   Value: 09/04/2013 02:09     Performed at [facility]   Culture     Final   Value:        BLOOD CULTURE RECEIVED NO GROWTH TO DATE CULTURE WILL BE HELD FOR 5 DAYS BEFORE ISSUING A FINAL NEGATIVE REPORT     Performed at [facility]   Report Status PENDING   Incomplete  MRSA PCR SCREENING     Status: None   Collection Time    09/03/13 10:27 PM      Result Value Ref Range Status   MRSA by PCR NEGATIVE  NEGATIVE Final   Comment:            The GeneXpert MRSA Assay (FDA     approved for NASAL specimens     only), is one component of a     comprehensive MRSA colonization     surveillance program. It is not     intended  to diagnose MRSA     infection nor to guide or     monitor treatment for     MRSA infections.  CULTURE, RESPIRATORY (NON-EXPECTORATED)     Status: None   Collection Time    09/04/13  8:11 AM      Result Value Ref Range Status   Specimen Description TRACHEAL ASPIRATE   Final   Special Requests Immunocompromised   Final   Gram Stain     Final   Value: NO WBC SEEN     NO SQUAMOUS EPITHELIAL CELLS SEEN     NO ORGANISMS SEEN     [facility]   Culture     Final   Value: FEW CANDIDA ALBICANS     [facility]formed at [facility]   Report Status 09/06/2013 FINAL   Final    Anti-infectives   Start     Dose/Rate Route Frequency Ordered Stop   09/05/13 1200  vancomycin (VANCOCIN) 500 mg in sodium chloride 0.9 % 100 mL IVPB     500 mg 100 mL/hr over 60 Minutes Intravenous Every 24 hours 09/04/13 1148     09/04/13 1800  cefTRIAXone (ROCEPHIN) 1 g in dextrose 5 % 50 mL IVPB  Status:  Discontinued     1 g 100 mL/hr over 30 Minutes Intravenous Every 24 hours 09/04/13 0610 09/04/13 1117   09/04/13 1300   vancomycin (VANCOCIN) 1,500 mg in sodium chloride 0.9 % 500 mL IVPB     1,500 mg 250 mL/hr over 120 Minutes Intravenous  Once 09/04/13 1146 09/04/13 1415   09/04/13 1200  piperacillin-tazobactam (ZOSYN) IVPB 3.375 g     3.375 g 12.5 mL/hr over 240 Minutes Intravenous Every 8 hours 09/04/13 1135     09/04/13 0615  levofloxacin (LEVAQUIN) IVPB 750 mg  Status:  Discontinued     750 mg 100 mL/hr over 90 Minutes Intravenous Daily 09/04/13 0610 09/04/13 1117   09/03/13 1900  cefTRIAXone (ROCEPHIN) 1 g in dextrose 5 % 50 mL IVPB     1 g 100 mL/hr over 30 Minutes Intravenous  Once 09/03/13 1858 09/03/13 2005      Assessment: 28 year old diabetic female found unresponsive on 3/19 and admitted. Initial glucose 1594, WBC 21, PH un-measurable, +serum Ketones, lactic acid >11 w/ early RLL infiltrate. PCCM asked to admit for DKA and possible septic shock. Hx IDDM, CKD. Patient originally started on ceftriaxone and then levofloxacin for suspected pneumonia. Pharmacy has now been consulted to dose vancomycin and Zosyn for r/o sepsis secondary to questionable intra-abdominal infection  3/19 >> ceftriaxone x1 3/20 >> levofloxacin x1 3/20 >> Zosyn >> 3/20 >> vancomycin >>   Tmax: now afebrile WBCs: improved since admit = 6 Renal: AoC CKD, SCr incr 4.14 (baseline ~1.7), CrCl 18.5 ml/min CG, 24 ml/min normalized, UOP  improved Lactic acid: 2.5 (3/21), 8.6 (3/20), 11.2 (3/19) PCT: 39.9 (3/21), 29.9 (3/20), 5.6 (3/19)  3/19 blood x2: ngtd 3/19 MRSA PCR (-) 3/19 S.pneumo Ur Ag: negative 3/19 Legionella Ur Ag: negative 3/20 sputum: few c. albicans  Goal of Therapy:  Vancomycin trough level 15-20 mcg/ml  Plan:   On vancomycin 500mg  IV q24h  Check VT today prior to 4th vanco dose  Change zosyn to 2.25gm IV q6h as SCr not showing significant signs of improving  Juliette Alcideustin Kizzi Overbey, PharmD, BCPS.   Pager: [phone]  09/07/2013,9:29 AM

## 2013-09-07 NOTE — Progress Notes (Addendum)
Hypoglycemic Event  CBG: 66  Treatment: D50 IV 25 mL  Symptoms: None  Follow-up CBG: Time:20:03 CBG Result:99  Possible Reasons for Event: Unknown  Comments/MD notified:[facility], Dr Delford FieldWright, DC'ed Lantus     Allison Quarryobb, Christeen DouglaseLisa Ann  Remember to initiate Hypoglycemia Order Set & complete

## 2013-09-07 NOTE — Progress Notes (Signed)
eLink Physician-Brief Progress Note Patient Name: [name] Ross DOB: 04/19/1986 MRN: [id number]  Date of Service  09/07/2013   HPI/Events of Note     eICU Interventions  Replaced magnesium   Intervention Category Intermediate Interventions: Electrolyte abnormality - evaluation and management  BYRUM,ROBERT S. 09/07/2013, 6:09 AM

## 2013-09-07 NOTE — Progress Notes (Signed)
Name: [name] MinorsJacqueline Ross MRN: [id number] DOB: 12/26/1985    ADMISSION DATE:  09/03/2013 CONSULTATION DATE:  3/19   REFERRING MD :  Jodi Mourningzavitz  PRIMARY SERVICE: pccm   CHIEF COMPLAINT:  dka and shock   BRIEF PATIENT DESCRIPTION:  28 year old diabetic female. Found unresponsive on 3/19. Initial glucose 1594, WBC 21, PH un-measurable, +serum Ketones, lactic acid >11 w/  early rll infiltrate. PCCM asked to admit for DKA and possible septic shock.   SIGNIFICANT EVENTS / STUDIES:  UDS 3/19>>neg TTE 3/21 >> intact LV fxn without evidence PAH 3/23  Insulin drip off. Gap closing  LINES / TUBES: oett 3/19>>> Right fem CVL (ER) 3/19 >>> Aline>>  CULTURES: BCX2 3/19>>> UC 3/19>>> Sputum 3/19>>>few candida   ANTIBIOTICS: levaquin 3/19>>> 3/20 Rocephin 3/19>>> 3/20 vanco 3/20 >>  Pip/tazo 3/20 >>    SUBJECTIVE:  Failed weaning due to tachypnea  And agitation  VITAL SIGNS: Temp:  [97.3 F (36.3 C)-99.9 F (37.7 C)] 97.3 F (36.3 C) (03/23 1030) Pulse Rate:  [77-109] 85 (03/23 1030) Resp:  [20-29] 22 (03/23 1030) BP: (115)/(82) 115/82 mmHg (03/22 2000) SpO2:  [100 %] 100 % (03/23 1030) Arterial Line BP: (90-138)/(57-85) 120/73 mmHg (03/23 1030) FiO2 (%):  [30 %] 30 % (03/23 0833) Weight:  [68.3 kg (150 lb 9.2 oz)] 68.3 kg (150 lb 9.2 oz) (03/23 0400) HEMODYNAMICS: CVP:  [9 mmHg-14 mmHg] 11 mmHg VENTILATOR SETTINGS: Vent Mode:  [-] PCV FiO2 (%):  [30 %] 30 % Set Rate:  [20 bmp] 20 bmp PEEP:  [5 cmH20] 5 cmH20 Plateau Pressure:  [15 cmH20-19 cmH20] 15 cmH20 INTAKE / OUTPUT: Intake/Output     03/22 0701 - 03/23 0700 03/23 0701 - 03/24 0700   I.V. (mL/kg) 4346 (63.6) 545.1 (8)   Other     NG/GT  30   IV Piggyback 275 50   Total Intake(mL/kg) 4621 (67.7) 625.1 (9.2)   Urine (mL/kg/hr) 3725 (2.3)    Emesis/NG output 1500 (0.9)    Total Output 5225     Net -604 +625.1          PHYSICAL EXAMINATION: General:  Ill appearing, comfortable on current sedation HEENT:  NCAT, ETT in place, large tongue without evidence bite trauma PULM: Crackles bilaterally CV: Tachy, regular AB: distended, tender, BS+ Ext: warm, trace edema noted Neuro: sedated on vent, but aroused and followed commands when sedation decreased  LABS:  PULMONARY  Recent Labs Lab 09/04/13 0740 09/04/13 1500 09/05/13 0345 09/05/13 1153 09/06/13 0940  PHART 7.155* 7.421 7.457* 7.316* 7.379  PCO2ART 33.7* 26.4* 24.4* 41.9 35.3  PO2ART 85.4 124.0* 162.0* 101.0* 107.0*  HCO3 11.4* 16.8* 17.1* 20.7 20.3  TCO2 11.4 16.0 16.1 19.8 18.8  O2SAT 96.3 98.1 98.3 96.3 97.1    CBC  Recent Labs Lab 09/05/13 0442 09/06/13 0220 09/06/13 1111 09/07/13 0230  HGB 8.3* 7.3*  --  7.6*  HCT 22.8* 20.0*  --  21.7*  WBC 10.1 7.4  --  6.0  PLT 161 118* 93* 88*    COAGULATION  Recent Labs Lab 09/06/13 1111  INR 1.16    CARDIAC   Recent Labs Lab 09/03/13 2359  TROPONINI <0.30   No results found for this basename: PROBNP,  in the last 168 hours   CHEMISTRY  Recent Labs Lab 09/03/13 1820 09/03/13 2003  09/05/13 1020 09/05/13 1740 09/06/13 0220 09/06/13 1415 09/07/13 0230  NA 120* 133*  < > 137 140 140 145 143  K 7.0* 5.5*  < > 4.1  4.4 4.3 4.3 4.2  CL 74* 91*  < > 100 104 103 108 106  CO2 <7* <7*  < > 16* 20 20 20 19   GLUCOSE 1594* 1226*  < > 113* 92 137* 72 190*  BUN 97* 84*  < > 58* 56* 53* 52* 48*  CREATININE 4.62* 3.88*  < > 3.93* 3.96* 3.99* 4.16* 4.14*  CALCIUM 9.1 6.8*  < > 6.6* 6.9* 7.5* 7.7* 8.0*  MG  --  2.6*  --   --   --   --   --  1.4*  PHOS  --   --   --   --   --   --   --  4.1  < > = values in this interval not displayed. Estimated Creatinine Clearance: 18.5 ml/min (by C-G formula based on Cr of 4.14).   LIVER  Recent Labs Lab 09/03/13 1820 09/06/13 1111  AST 193*  --   ALT 93*  --   ALKPHOS 118*  --   BILITOT 0.4  --   PROT 6.8  --   ALBUMIN 3.4*  --   INR  --  1.16     INFECTIOUS  Recent Labs Lab 09/03/13 2003 09/03/13 2359  09/04/13 0405 09/04/13 1440 09/05/13 0430 09/05/13 0646  LATICACIDVEN  --  7.7*  --  8.6*  --  2.5*  PROCALCITON 5.60  --  29.92  --  39.90  --      ENDOCRINE CBG (last 3)   Recent Labs  09/06/13 2340 09/07/13 0759 09/07/13 0900  GLUCAP 84 78 70         IMAGING x48h  Dg Chest Port 1 View  09/06/2013   CLINICAL DATA:  Endotracheal tube.  EXAM: PORTABLE CHEST - 1 VIEW  COMPARISON:  September 05, 2013.  FINDINGS: Endotracheal tube is in grossly good position with distal tip 3 cm above the carina. Nasogastric tube is seen entering the stomach. No pneumothorax or pleural effusion is noted. Mild right basilar opacity is noted consistent with subsegmental atelectasis or pneumonia. Bony thorax is intact.  IMPRESSION: Mild right basilar opacity consistent with subsegmental atelectasis or pneumonia. Endotracheal and nasogastric tubes are in grossly good position.   Electronically Signed   By: Roque Lias M.D.   On: 09/06/2013 08:45   Dg Chest Port 1 View  09/05/2013   CLINICAL DATA:  28 year old female - code blue.  EXAM: PORTABLE CHEST - 1 VIEW  COMPARISON:  09/05/2013 and prior chest radiographs dating back to 10/18/2011  FINDINGS: Upper limits normal heart size identified.  Mild interstitial pulmonary edema is present.  An endotracheal tube with tip 1.7 cm above the carina, NG tube with tip overlying the mid stomach, and defibrillator pads overlying the chest identified.  There is no evidence of large pleural effusion or pneumothorax.  IMPRESSION: Mild pulmonary edema.  Support apparatus as described.   Electronically Signed   By: Laveda Abbe M.D.   On: 09/05/2013 15:54      ASSESSMENT / PLAN:  PULMONARY A: Acute respiratory failure in setting of ams and profound acidosis  P:   Continue PCV, will decrease RR with normalization pH Close obs of abg Daily SBT/WUA/CXR/ABG   CARDIOVASCULAR A:  Circulatory shock: sepsis vs cardiogenic dysfxn in setting of profound acidosis. TTE 3/21  shows intact LV fxn without evidence PAH      Currently euvolemic to volume up      Bradycardia in setting of acidosis and hyperkalemia  VT on 3/21, received amio bolus x 1 P:  Continue levophed, weaning for MAP > 65 F/U cortisol 8.8 but off pressors  RENAL Lab Results  Component Value Date   CREATININE 4.14* 09/07/2013   CREATININE 4.16* 09/06/2013   CREATININE 3.99* 09/06/2013    A:  AKI, Oliguric due to hypovolemia and shock, S Cr seems to be at plateau DKA with metabolic acidosis improving Hypokalemia P:   Monitor CVP (11) Follow BMET/ABG  May need HD in future and renal consult  GASTROINTESTINAL A:  Diabetic gastroparesis  Distended abdomen with tenderness P:   ppi  Follow exam   HEMATOLOGIC A:  Anemia  Evolving thrombocytopenia P:  Stop heparin 3/22 Send HITT and DIC panels 3/22(d dimer 1.99, fibrogen 472) F/u cbc   INFECTIOUS A: Septic shock, source uncertain P:   Pan culture  Changed ceftriaxone/levaquin to zosyn > abdominal source?  vanc  dc 3/23)  ENDOCRINE A:  DKA      DM w/ Hyperglycemia  P:   dka protocol   NEUROLOGIC A: acute encephalopathy in setting of DKA   P:   Continue fentanyl/propofol for now  Family : no family here 3/21 or 3/22 CODE: full  Brett Canales Minor ACNP [facility] PCCM Pager [phone] till 3 pm If no answer page [phone] 09/07/2013, 11:09 AM    STAFF NOTE: I, Dr Lavinia Sharps have personally reviewed patient's available data, including medical history, events of note, physical examination and test results as part of my evaluation. I have discussed with resident/NP and other care providers such as pharmacist, RN and RRT.  In addition,  I personally evaluated patient and elicited key findings of acute resp failure in settnig of dka and renal failure. Creat appears stabilizng and no sign of acidosis, volume overload, hyperkalemia to warrant cone transfer. No family at bedside. She failed SBT due to agitation.  Rest per  NP/medical resident whose note is outlined above and that I agree with  The patient is critically ill with multiple organ systems failure and requires high complexity decision making for assessment and support, frequent evaluation and titration of therapies, application of advanced monitoring technologies and extensive interpretation of multiple databases.   Critical Care Time devoted to patient care services described in this note is  35  Minutes.  Dr. Kalman Shan, M.D., [facility].C.P Pulmonary and Critical Care Medicine Staff Physician [facility]  Pulmonary and Critical Care Pager: [phone], If no answer or between  15:00h - 7:00h: call [phone]  09/07/2013 11:44 AM

## 2013-09-07 NOTE — Progress Notes (Signed)
PHARMACY BRIEF NOTE:  VANCOMYCIN  Vancomycin trough 24.1 on D#4 (1500mg  x 1, then 500mg  q24h)  SCr 4.1, not improving.   Blood cultures from 3/19 are negative to date. Tracheal aspirate from 3/20 growing only a few Candida albicans.  Assessment:  Vancomycin trough supratherapeutic  Acute kidney injury in setting of shock   Plan: 1.  Agree with discontinuation of vancomycin and continue empiric Zosyn for now at reduced dosage for renal insufficiency. 2.  Follow cultures.  Elie Goodyandy Regis Wiland, PharmD, BCPS Pager: [phone] 09/07/2013  12:05 PM

## 2013-09-08 ENCOUNTER — Inpatient Hospital Stay (HOSPITAL_COMMUNITY): Payer: Medicare Other

## 2013-09-08 LAB — GLUCOSE, CAPILLARY
GLUCOSE-CAPILLARY: 137 mg/dL — AB (ref 70–99)
GLUCOSE-CAPILLARY: 111 mg/dL — AB (ref 70–99)
GLUCOSE-CAPILLARY: 126 mg/dL — AB (ref 70–99)
GLUCOSE-CAPILLARY: 200 mg/dL — AB (ref 70–99)
GLUCOSE-CAPILLARY: 257 mg/dL — AB (ref 70–99)
GLUCOSE-CAPILLARY: 180 mg/dL — AB (ref 70–99)
Glucose-Capillary: 106 mg/dL — ABNORMAL HIGH (ref 70–99)
Glucose-Capillary: 126 mg/dL — ABNORMAL HIGH (ref 70–99)
Glucose-Capillary: 155 mg/dL — ABNORMAL HIGH (ref 70–99)
Glucose-Capillary: 171 mg/dL — ABNORMAL HIGH (ref 70–99)
Glucose-Capillary: 184 mg/dL — ABNORMAL HIGH (ref 70–99)
Glucose-Capillary: 222 mg/dL — ABNORMAL HIGH (ref 70–99)
Glucose-Capillary: 229 mg/dL — ABNORMAL HIGH (ref 70–99)
Glucose-Capillary: 99 mg/dL (ref 70–99)
Glucose-Capillary: 103 mg/dL — ABNORMAL HIGH (ref 70–99)
Glucose-Capillary: 156 mg/dL — ABNORMAL HIGH (ref 70–99)
Glucose-Capillary: 122 mg/dL — ABNORMAL HIGH (ref 70–99)
Glucose-Capillary: 123 mg/dL — ABNORMAL HIGH (ref 70–99)
Glucose-Capillary: 117 mg/dL — ABNORMAL HIGH (ref 70–99)

## 2013-09-08 LAB — BASIC METABOLIC PANEL
BUN: 40 mg/dL — AB (ref 6–23)
BUN: 35 mg/dL — ABNORMAL HIGH (ref 6–23)
CALCIUM: 8.3 mg/dL — AB (ref 8.4–10.5)
CHLORIDE: 109 meq/L (ref 96–112)
CO2: 21 mEq/L (ref 19–32)
CO2: 20 mEq/L (ref 19–32)
CREATININE: 3.89 mg/dL — AB (ref 0.50–1.10)
CREATININE: 3.82 mg/dL — AB (ref 0.50–1.10)
Calcium: 8.3 mg/dL — ABNORMAL LOW (ref 8.4–10.5)
Chloride: 109 mEq/L (ref 96–112)
GFR calc non Af Amer: 15 mL/min — ABNORMAL LOW (ref >90)
GFR calc non Af Amer: 15 mL/min — ABNORMAL LOW (ref >90)
GFR, EST AFRICAN AMERICAN: 17 mL/min — AB (ref >90)
GFR, EST AFRICAN AMERICAN: 18 mL/min — AB (ref >90)
Glucose, Bld: 118 mg/dL — ABNORMAL HIGH (ref 70–99)
Glucose, Bld: 142 mg/dL — ABNORMAL HIGH (ref 70–99)
POTASSIUM: 3.5 meq/L — AB (ref 3.7–5.3)
Potassium: 3.6 mEq/L — ABNORMAL LOW (ref 3.7–5.3)
SODIUM: 144 meq/L (ref 137–147)
Sodium: 144 mEq/L (ref 137–147)

## 2013-09-08 LAB — PHOSPHORUS: Phosphorus: 4.5 mg/dL (ref 2.3–4.6)

## 2013-09-08 LAB — MAGNESIUM: MAGNESIUM: 1.7 mg/dL (ref 1.5–2.5)

## 2013-09-08 LAB — TRIGLYCERIDES: Triglycerides: 147 mg/dL (ref <150)

## 2013-09-08 MED ORDER — PANTOPRAZOLE SODIUM 40 MG PO TBEC
40.0000 mg | DELAYED_RELEASE_TABLET | Freq: Every day | ORAL | Status: DC
  Administered 2013-09-08 – 2013-09-12 (×5): 40 mg via ORAL
  Filled 2013-09-08 (×4): qty 1

## 2013-09-08 MED ORDER — INSULIN GLARGINE 100 UNIT/ML ~~LOC~~ SOLN
8.0000 [IU] | Freq: Every day | SUBCUTANEOUS | Status: DC
  Administered 2013-09-08 – 2013-09-09 (×2): 8 [IU] via SUBCUTANEOUS
  Filled 2013-09-08 (×2): qty 0.08

## 2013-09-08 MED ORDER — ACETAMINOPHEN 325 MG PO TABS
650.0000 mg | ORAL_TABLET | Freq: Four times a day (QID) | ORAL | Status: DC | PRN
  Administered 2013-09-08: 650 mg via ORAL
  Filled 2013-09-08: qty 2

## 2013-09-08 MED ORDER — ALUM & MAG HYDROXIDE-SIMETH 200-200-20 MG/5ML PO SUSP
30.0000 mL | Freq: Once | ORAL | Status: AC
  Administered 2013-09-08: 30 mL via ORAL
  Filled 2013-09-08: qty 30

## 2013-09-08 NOTE — Progress Notes (Signed)
Name: [name] Ross MRN: [id number] DOB: January 11, 1986    ADMISSION DATE:  09/03/2013 CONSULTATION DATE:  3/19   REFERRING MD :  Jodi Mourning  PRIMARY SERVICE: pccm   CHIEF COMPLAINT:  dka and shock   BRIEF PATIENT DESCRIPTION:  28 year old diabetic female. Found unresponsive on 3/19. Initial glucose 1594, WBC 21, PH un-measurable, +serum Ketones, lactic acid >11 w/  early rll infiltrate. PCCM asked to admit for DKA and possible septic shock.   SIGNIFICANT EVENTS / STUDIES:  UDS 3/19>>neg TTE 3/21 >> intact LV fxn without evidence PAH 3/23  Insulin drip off. Gap closing 3/24 off insulin drip. Wide awake, gap 16  LINES / TUBES: oett 3/19>>>3/34 Right fem CVL (ER) 3/19 >>> (NEEDS TO COME OUT 3/25, tILL THEN NO AMBULATION, OK TO SIT) Aline>>3/24  CULTURES: BCX2 3/19>>> UC 3/19>>>not done Sputum 3/19>>>few candida   ANTIBIOTICS: levaquin 3/19>>> 3/20 Rocephin 3/19>>> 3/20 vanco 3/20 >> 3/23 Pip/tazo 3/20 >>    SUBJECTIVE:  Extubated without problem  VITAL SIGNS: Temp:  [97 F (36.1 C)-98.1 F (36.7 C)] 97.7 F (36.5 C) (03/24 0730) Pulse Rate:  [74-92] 90 (03/24 0730) Resp:  [0-24] 15 (03/24 0730) BP: (116-130)/(69-82) 130/79 mmHg (03/24 0429) SpO2:  [98 %-100 %] 100 % (03/24 0730) Arterial Line BP: (89-131)/(51-83) 131/83 mmHg (03/24 0730) FiO2 (%):  [30 %] 30 % (03/24 0600) HEMODYNAMICS: CVP:  [10 mmHg] 10 mmHg VENTILATOR SETTINGS: Vent Mode:  [-] PSV;CPAP FiO2 (%):  [30 %] 30 % Set Rate:  [16 bmp-20 bmp] 16 bmp PEEP:  [5 cmH20] 5 cmH20 Pressure Support:  [5 cmH20] 5 cmH20 Plateau Pressure:  [10 cmH20-20 cmH20] 20 cmH20 INTAKE / OUTPUT: Intake/Output     03/23 0701 - 03/24 0700 03/24 0701 - 03/25 0700   I.V. (mL/kg) 4230.5 (61.9)    NG/GT 60    IV Piggyback 250    Total Intake(mL/kg) 4540.5 (66.5)    Urine (mL/kg/hr) 3875 (2.4)    Emesis/NG output 150 (0.1)    Total Output 4025     Net +515.5            PHYSICAL EXAMINATION: General:  Awake ane  alert HEENT: No JVD/LAN PULM: Decreased bs bases CV:  Regular rr AB: +bs Ext: warm, trace edema noted Neuro: intact  LABS:  PULMONARY  Recent Labs Lab 09/04/13 1500 09/05/13 0345 09/05/13 1153 09/06/13 0940 09/07/13 1135  PHART 7.421 7.457* 7.316* 7.379 7.413  PCO2ART 26.4* 24.4* 41.9 35.3 30.2*  PO2ART 124.0* 162.0* 101.0* 107.0* 103.0*  HCO3 16.8* 17.1* 20.7 20.3 18.9*  TCO2 16.0 16.1 19.8 18.8 18.0  O2SAT 98.1 98.3 96.3 97.1 97.4    CBC  Recent Labs Lab 09/05/13 0442 09/06/13 0220 09/06/13 1111 09/07/13 0230  HGB 8.3* 7.3*  --  7.6*  HCT 22.8* 20.0*  --  21.7*  WBC 10.1 7.4  --  6.0  PLT 161 118* 93* 88*    COAGULATION  Recent Labs Lab 09/06/13 1111  INR 1.16    CARDIAC    Recent Labs Lab 09/03/13 2359  TROPONINI <0.30   No results found for this basename: PROBNP,  in the last 168 hours   CHEMISTRY  Recent Labs Lab 09/03/13 1820 09/03/13 2003  09/06/13 1415 09/07/13 0230 09/07/13 1030 09/07/13 1415 09/08/13 0502  NA 120* 133*  < > 145 143 142 143 144  K 7.0* 5.5*  < > 4.3 4.2 4.0 4.2 3.5*  CL 74* 91*  < > 108 106 104 106 109  CO2 <  7* <7*  < > 20 19 20 19 21   GLUCOSE 1594* 1226*  < > 72 190* 151* 281* 118*  BUN 97* 84*  < > 52* 48* 46* 46* 40*  CREATININE 4.62* 3.88*  < > 4.16* 4.14* 4.11* 4.07* 3.89*  CALCIUM 9.1 6.8*  < > 7.7* 8.0* 8.1* 8.1* 8.3*  MG  --  2.6*  --   --  1.4*  --   --  1.7  PHOS  --   --   --   --  4.1  --   --  4.5  < > = values in this interval not displayed. Estimated Creatinine Clearance: 19.7 ml/min (by C-G formula based on Cr of 3.89).   LIVER  Recent Labs Lab 09/03/13 1820 09/06/13 1111  AST 193*  --   ALT 93*  --   ALKPHOS 118*  --   BILITOT 0.4  --   PROT 6.8  --   ALBUMIN 3.4*  --   INR  --  1.16     INFECTIOUS  Recent Labs Lab 09/03/13 2003 09/03/13 2359 09/04/13 0405 09/04/13 1440 09/05/13 0430 09/05/13 0646  LATICACIDVEN  --  7.7*  --  8.6*  --  2.5*  PROCALCITON 5.60  --   29.92  --  39.90  --      ENDOCRINE CBG (last 3)   Recent Labs  09/07/13 2000 09/07/13 2333 09/08/13 0315  GLUCAP 99 88 103*         IMAGING x48h  Dg Chest Port 1 View  09/08/2013   CLINICAL DATA:  Diabetic ketoacidosis.  Shock.  EXAM: PORTABLE CHEST - 1 VIEW  COMPARISON:  09/06/2013 and 09/04/2013  FINDINGS: Endotracheal tube and NG tube appear in good position. There is increasing density at the lung bases consistent with atelectasis. Heart size and pulmonary vascularity are normal.  IMPRESSION: Increasing slight atelectasis at the lung bases.   Electronically Signed   By: Geanie Cooley M.D.   On: 09/08/2013 07:40      ASSESSMENT / PLAN:  PULMONARY A: Acute respiratory failure in setting of ams and profound acidosis  Resolved 3/24 with extubation P:   Extubate and mobilize 3/24  CARDIOVASCULAR A:  Circulatory shock: sepsis vs cardiogenic dysfxn in setting of profound acidosis. TTE 3/21 shows intact LV fxn without evidence PAH      Currently euvolemic to volume up      Bradycardia in setting of acidosis and hyperkalemia       VT on 3/21, received amio bolus x 1      3/24 resolved P:  Replete K+  RENAL Lab Results  Component Value Date   CREATININE 3.89* 09/08/2013   CREATININE 4.07* 09/07/2013   CREATININE 4.11* 09/07/2013    A:  AKI, Oliguric due to hypovolemia and shock, S Cr seems to be at plateau DKA with metabolic acidosis improving Hypokalemia P:   Follow BMET/ABG  Doubt she will need HD. Continue IVF till taking PO's well  GASTROINTESTINAL A:  Diabetic gastroparesis  Distended abdomen with tenderness Resolving 3/24 P:   ppi  Follow exam 3/24 start diet   HEMATOLOGIC A:  Anemia  Evolving thrombocytopenia P:  Stop heparin 3/22 Send HITT and DIC panels 3/22(d dimer 1.99, fibrogen 472) F/u cbc   INFECTIOUS A: Septic shock, source uncertain P:   Pan culture  Changed ceftriaxone/levaquin to zosyn > abdominal source?  vanc  dc  3/23)  ENDOCRINE A:  DKA      DM  w/ Hyperglycemia  P:   dka protocol   NEUROLOGIC A: acute encephalopathy in setting of DKA     Resolved 3/24 P:   DC sedation   Family : no family here 3/21 or 3/22 or 3/24 CODE: full  Change to SDU status and have [facility] assume her care 3/25.  Brett CanalesSteve Minor ACNP [facility] Pager [phone] till 3 pm If no answer page [phone] 09/08/2013, 8:07 AM    Staff note  Improved but very deconditioned. Move to sDU status. TRH to pick up 3/25. She will be in bed rest of chair rest 3/24 due to femoral line (unable to get PIV) whioch should come out in 24-48h. Avoid PICC for now due to ATN   Dr. Kalman ShanMurali Sharnell Knight, M.D., [facility] CenterF.C.C.P Pulmonary and Critical Care Medicine Staff Physician [facility] Pager: [phone], If no answer or between  15:00h - 7:00h: call [phone]  09/08/2013 10:15 AM

## 2013-09-08 NOTE — Progress Notes (Addendum)
Inpatient Diabetes Program Recommendations  AACE/ADA: New Consensus Statement on Inpatient Glycemic Control (2013)  Target Ranges:  Prepandial:   less than 140 mg/dL      Peak postprandial:   less than 180 mg/dL (1-2 hours)      Critically ill patients:  140 - 180 mg/dL    Results for [name] MinorsHENDERSON, Allayna (MRN [id number]) as of 09/08/2013 08:09  Ref. Range 09/07/2013 12:07 09/07/2013 14:05 09/07/2013 16:04 09/07/2013 19:36 09/07/2013 20:00  Glucose-Capillary Latest Range: 70-99 mg/dL 914200 (H) 782257 (H) 956180 (H) 66 (L) 99    Results for [name] MinorsHENDERSON, [name] Ross (MRN [id number]) as of 09/08/2013 08:09  Ref. Range 09/07/2013 23:33 09/08/2013 03:15  Glucose-Capillary Latest Range: 70-99 mg/dL 88 469103 (H)     **Note pt transitioned off IV insulin drip yesterday.  Was given 10 units Lantus at 2pm and insulin drip was stopped.  Normal home dose of basal insulin is Lantus 11 units daily.  **Pt had hypoglycemia last night at 8pm.  Lantus insulin was d/c'd as a result.  Pt currently only getting Novolog SSI Q4 hours per ICU Glycemic Control Protocol scale.  **Pt for extubation this morning.  **Note RN stating that pt is interested in using an insulin pump at home.  DM Coordinator will speak with pt about this.   **Concern that pt will be back in DKA if we don't add back at least a portion of her home basal insulin.  Pt has Type 1 DM and needs some basal insulin.   **MD- Please consider the following:  1. Add Lantus insulin- 8 units daily  2. Change SSI to Novolog Sensitive Q4 hours per Glycemic Control Order set  3. D/C ICU Glycemic Control Protocol   Will follow. Ambrose FinlandJeannine Johnston Cecily Lawhorne RN, MSN, CDE Diabetes Coordinator Inpatient Diabetes Program Team Pager: [phone] (8a-10p)

## 2013-09-08 NOTE — Plan of Care (Signed)
Problem: Consults Goal: Diabetes Guidelines if Diabetic/Glucose > 140 If diabetic or lab glucose is > 140 mg/dl - Initiate Diabetes/Hyperglycemia Guidelines & Document Interventions  Outcome: Progressing Pt would like a consult with diabetes coordinator to discuss insulin pump

## 2013-09-08 NOTE — Progress Notes (Signed)
NUTRITION FOLLOW UP  Intervention:   - Agree with recommendation for diabetic coordinator - Recommend MD consider psych consult as pt appears depressed and does not seem motivated to care about herself. Tried to explain the importance of being compliant with insulin however pt did not appear interested - Will continue to monitor   Nutrition Dx:   Inadequate oral intake related to inability to eat as evidenced by NPO, mechanical ventilation - ongoing but now related to pt not yet ordering food per pt report.    Goal:   Initiation of TF with goal to meet >90% of estimated nutritional needs - not met, not on TF  New goal: Pt to consume >90% of meals   Monitor:   Weights, labs, intake  Assessment:   Pt discussed during multidisciplinary rounds. Found unresponsive, initial glucose 1594 mg/dL. Hx of type 1 IDDM, CKD, hypothyroidism, anemia, and vitamin D deficiency.   3/20 - Intubated yesterday for progressive respiratory failure. Pt oliguric and has received over 11L of normal saline. Pt with distended abdomen with tenderness and diabetic gastroparesis.   3/24 - Code blue called 3/21, pt lost pulse, compressions were done that lasted 2-3 minutes, pulse achieved. Extubated today. Met with pt who reports eating 3 meals/day at home and has had stable weight. Reports her blood sugars were elevated at home because she stopped taking her insulin. When asked why pt, pt just shrugged her shoulders.   Height: Ht Readings from Last 1 Encounters:  09/03/13 _0  (1.651 m)    Weight Status:   Wt Readings from Last 1 Encounters:  09/07/13 150 lb 9.2 oz (68.3 kg)  Admit wt:        127 lb 3.3 oz (57.5 kg)  Re-estimated needs:  Kcal: 1500-1700 Protein: 70-80g Fluid: 1.5-1.7L/day   Skin: +2 RUE, LUE edema, +1 RLE, LLE edema, +2 facial edema  Diet Order: Carb Control   Intake/Output Summary (Last 24 hours) at 09/08/13 1245 Last data filed at 09/08/13 1200  Gross per 24 hour  Intake  4371.13 ml  Output   2525 ml  Net 1846.13 ml    Last BM: PTA   Labs:   Recent Labs Lab 09/03/13 2003  09/07/13 0230 09/07/13 1030 09/07/13 1415 09/08/13 0502  NA 133*  < > 143 142 143 144  K 5.5*  < > 4.2 4.0 4.2 3.5*  CL 91*  < > 106 104 106 109  CO2 <7*  < > _1 BUN 84*  < > 48* 46* 46* 40*  CREATININE 3.88*  < > 4.14* 4.11* 4.07* 3.89*  CALCIUM 6.8*  < > 8.0* 8.1* 8.1* 8.3*  MG 2.6*  --  1.4*  --   --  1.7  PHOS  --   --  4.1  --   --  4.5  GLUCOSE 1226*  < > 190* 151* 281* 118*  < > = values in this interval not displayed.  CBG (last 3)   Recent Labs  09/08/13 0315 09/08/13 0803 09/08/13 1155  GLUCAP 103* 156* 122*    Scheduled Meds: . antiseptic oral rinse  15 mL Mouth Rinse QID  . insulin aspart  1-3 Units Subcutaneous 6 times per day  . insulin glargine  8 Units Subcutaneous Daily  . pantoprazole  40 mg Oral Daily  . piperacillin-tazobactam (ZOSYN)  IV  2.25 g Intravenous 4 times per day    Continuous Infusions: . sodium chloride 20 mL/hr at 09/06/13 1800  .  dextrose 5 % and 0.45% NaCl 150 mL/hr at 09/07/13 [place], [place], [place] Pager [phone] After Hours Pager

## 2013-09-08 NOTE — Progress Notes (Signed)
Pt would like consult with diabetes coordinator regarding steps for obtaining an insulin pump.

## 2013-09-08 NOTE — Procedures (Signed)
Extubation Procedure Note  Patient Details:   Name: [name] Ross DOB: 01/17/1986 MRN: [id number]   Airway Documentation:  Extubated per MD order. Cuff leak present.    Evaluation  O2 sats: stable throughout Complications: No apparent complications Patient did tolerate procedure well. Bilateral Breath Sounds: Clear;Diminished Suctioning: Oral;Airway Yes  Kendall FlackJackson, Hopelynn Gartland Royal 09/08/2013, 8:10 AM

## 2013-09-08 NOTE — Progress Notes (Signed)
eLink Physician-Brief Progress Note Patient Name: [name] Ross DOB: 12/16/1985 MRN: [id number]  Date of Service  09/08/2013   HPI/Events of Note   C/o abd pain.  C/o chest pain from CPR.  eICU Interventions  Will order maalox 30 ml x one.  Will order prn acetaminophen.    Intervention Category Intermediate Interventions: Abdominal pain - evaluation and management  [name] 09/08/2013, 4:10 PM

## 2013-09-09 ENCOUNTER — Inpatient Hospital Stay (HOSPITAL_COMMUNITY): Payer: Medicare Other

## 2013-09-09 DIAGNOSIS — E039 Hypothyroidism, unspecified: Secondary | ICD-10-CM

## 2013-09-09 DIAGNOSIS — E1010 Type 1 diabetes mellitus with ketoacidosis without coma: Secondary | ICD-10-CM

## 2013-09-09 DIAGNOSIS — E872 Acidosis, unspecified: Secondary | ICD-10-CM

## 2013-09-09 DIAGNOSIS — E875 Hyperkalemia: Secondary | ICD-10-CM

## 2013-09-09 LAB — CBC
HEMATOCRIT: 22.6 % — AB (ref 36.0–46.0)
HEMOGLOBIN: 7.7 g/dL — AB (ref 12.0–15.0)
MCH: 30.8 pg (ref 26.0–34.0)
MCHC: 34.1 g/dL (ref 30.0–36.0)
MCV: 90.4 fL (ref 78.0–100.0)
Platelets: 87 10*3/uL — ABNORMAL LOW (ref 150–400)
RBC: 2.5 MIL/uL — ABNORMAL LOW (ref 3.87–5.11)
RDW: 15.4 % (ref 11.5–15.5)
WBC: 3.7 10*3/uL — ABNORMAL LOW (ref 4.0–10.5)

## 2013-09-09 LAB — BASIC METABOLIC PANEL
BUN: 32 mg/dL — AB (ref 6–23)
BUN: 32 mg/dL — AB (ref 6–23)
BUN: 30 mg/dL — ABNORMAL HIGH (ref 6–23)
CALCIUM: 8.1 mg/dL — AB (ref 8.4–10.5)
CHLORIDE: 112 meq/L (ref 96–112)
CO2: 21 mEq/L (ref 19–32)
CO2: 21 mEq/L (ref 19–32)
CO2: 22 meq/L (ref 19–32)
CREATININE: 3.8 mg/dL — AB (ref 0.50–1.10)
Calcium: 8.2 mg/dL — ABNORMAL LOW (ref 8.4–10.5)
Calcium: 8.1 mg/dL — ABNORMAL LOW (ref 8.4–10.5)
Chloride: 108 mEq/L (ref 96–112)
Chloride: 110 mEq/L (ref 96–112)
Creatinine, Ser: 3.73 mg/dL — ABNORMAL HIGH (ref 0.50–1.10)
Creatinine, Ser: 3.59 mg/dL — ABNORMAL HIGH (ref 0.50–1.10)
GFR calc Af Amer: 18 mL/min — ABNORMAL LOW (ref >90)
GFR calc Af Amer: 18 mL/min — ABNORMAL LOW (ref >90)
GFR calc Af Amer: 19 mL/min — ABNORMAL LOW (ref >90)
GFR calc non Af Amer: 16 mL/min — ABNORMAL LOW (ref >90)
GFR, EST NON AFRICAN AMERICAN: 15 mL/min — AB (ref >90)
GFR, EST NON AFRICAN AMERICAN: 16 mL/min — AB (ref >90)
GLUCOSE: 91 mg/dL (ref 70–99)
GLUCOSE: 70 mg/dL (ref 70–99)
Glucose, Bld: 90 mg/dL (ref 70–99)
POTASSIUM: 3.4 meq/L — AB (ref 3.7–5.3)
POTASSIUM: 3.9 meq/L (ref 3.7–5.3)
Potassium: 3.4 mEq/L — ABNORMAL LOW (ref 3.7–5.3)
SODIUM: 145 meq/L (ref 137–147)
Sodium: 144 mEq/L (ref 137–147)
Sodium: 144 mEq/L (ref 137–147)

## 2013-09-09 LAB — GLUCOSE, CAPILLARY
GLUCOSE-CAPILLARY: 82 mg/dL (ref 70–99)
GLUCOSE-CAPILLARY: 63 mg/dL — AB (ref 70–99)
GLUCOSE-CAPILLARY: 111 mg/dL — AB (ref 70–99)
Glucose-Capillary: 101 mg/dL — ABNORMAL HIGH (ref 70–99)
Glucose-Capillary: 98 mg/dL (ref 70–99)
Glucose-Capillary: 126 mg/dL — ABNORMAL HIGH (ref 70–99)
Glucose-Capillary: 147 mg/dL — ABNORMAL HIGH (ref 70–99)

## 2013-09-09 LAB — HEMOGLOBIN A1C
Hgb A1c MFr Bld: 9.1 % — ABNORMAL HIGH (ref <5.7)
Mean Plasma Glucose: 214 mg/dL — ABNORMAL HIGH (ref <117)

## 2013-09-09 MED ORDER — PNEUMOCOCCAL VAC POLYVALENT 25 MCG/0.5ML IJ INJ
0.5000 mL | INJECTION | INTRAMUSCULAR | Status: AC
  Administered 2013-09-10: 0.5 mL via INTRAMUSCULAR
  Filled 2013-09-09 (×2): qty 0.5

## 2013-09-09 MED ORDER — POTASSIUM CHLORIDE CRYS ER 20 MEQ PO TBCR
40.0000 meq | EXTENDED_RELEASE_TABLET | Freq: Once | ORAL | Status: DC
  Filled 2013-09-09: qty 2

## 2013-09-09 MED ORDER — INSULIN GLARGINE 100 UNIT/ML ~~LOC~~ SOLN
10.0000 [IU] | Freq: Every day | SUBCUTANEOUS | Status: DC
  Administered 2013-09-10: 10 [IU] via SUBCUTANEOUS
  Filled 2013-09-09 (×2): qty 0.1

## 2013-09-09 MED ORDER — INSULIN ASPART 100 UNIT/ML ~~LOC~~ SOLN
0.0000 [IU] | Freq: Three times a day (TID) | SUBCUTANEOUS | Status: DC
  Administered 2013-09-11: 1 [IU] via SUBCUTANEOUS
  Administered 2013-09-11: 2 [IU] via SUBCUTANEOUS
  Administered 2013-09-12: 3 [IU] via SUBCUTANEOUS

## 2013-09-09 MED ORDER — POTASSIUM CHLORIDE 10 MEQ/50ML IV SOLN
10.0000 meq | INTRAVENOUS | Status: AC
  Administered 2013-09-09 (×4): 10 meq via INTRAVENOUS
  Filled 2013-09-09 (×4): qty 50

## 2013-09-09 MED ORDER — SODIUM BICARBONATE 650 MG PO TABS
650.0000 mg | ORAL_TABLET | Freq: Two times a day (BID) | ORAL | Status: DC
  Administered 2013-09-09 – 2013-09-12 (×6): 650 mg via ORAL
  Filled 2013-09-09 (×7): qty 1

## 2013-09-09 NOTE — Progress Notes (Addendum)
Inpatient Diabetes Program Recommendations  AACE/ADA: New Consensus Statement on Inpatient Glycemic Control (2013)  Target Ranges:  Prepandial:   less than 140 mg/dL      Peak postprandial:   less than 180 mg/dL (1-2 hours)      Critically ill patients:  140 - 180 mg/dL     Results for [name] Ross, [name] Ross (MRN [id number]) as of 09/09/2013 07:55  Ref. Range 09/07/2013 23:33 09/08/2013 03:15 09/08/2013 08:03 09/08/2013 11:55 09/08/2013 15:47 09/08/2013 19:21  Glucose-Capillary Latest Range: 70-99 mg/dL 88 409 (H) 811 (H) 914 (H) 123 (H) 117 (H)    Results for [name] Ross, [name] Ross (MRN [id number]) as of 09/09/2013 07:55  Ref. Range 09/08/2013 23:01 09/09/2013 03:50  Glucose-Capillary Latest Range: 70-99 mg/dL 086 (H) 82    **Pt extubated yesterday.  Now on Carbohydrate Modified diet.  **CBGs stable on current regimen of Lantus 8 units daily + Novolog SSI Q4 hours per ICU Glycemic Control Protocol  **Note pt would like to speak with DM Coordinator about the necessary steps involved in obtaining an insulin pump for blood sugar control.  Will speak with pt today about this.  Based on multiple factors (patient has had multiple hospitalizations for DKA [2 admits in 2013, 2 admits in 2014, 2 admits in 2015 so far], lack of PCP, lack of Endocrinologist, lack of regular visits with a physician, lack of desire to care for self at home [pt stated to RD that she doesn't check her CBGs at home on a regular basis]), pt is NOT a good candidate for an insulin pump.   MD- Please consider the following:  1. D/C ICU Glycemic Control Protocol and current SSI regimen 2. Start Novolog Sensitive SSI tid ac + HS per Glycemic Control order set 3. Continue current Lantus dose   Addendum 1228pm: Spoke with pt this morning about the events that led to this admission for pt.  Pt was very quiet during our conversation and did not have much to say.  Pt did tell me that she thinks she missed her Lantus dose the night before  her admission.  Would not tell me specifically how often she checks CBGs at home but did tell me she does have a meter and uses both insulin pens and insulin vials.  According to pt, she lives alone and does not work.  Boyfriend found her unconscious and called 911.  Has mother and grandmother in the area.  I [facility] pt [facility].  Pt did not tell me much about that admission, but she did tell me that she followed up at the [facility] in [place] back in February.  Per pt, she is now established with the [facility] and will get her follow up care with the physicians there from now on.  Spoke with pt about insulin pumps b/c the RN caring for pt on 03/24 stated pt wants an insulin pump.  I was very frank during our conversation and told pt that insulin pumps require a lot of training and that most physicians want to see anywhere from 3 months to 6 months worth of blood sugar data and insulin dosing before they will consider starting a pt on an insulin pump.  Suggested to pt that she begin to keep a detailed record of her CBGs and insulin dosing and that she inquire with the [facility] about the necessary steps involved in obtaining and insulin pump.    Will follow. Cincere Zorn Tawanna Sat  RN, MSN, CDE Diabetes Coordinator Inpatient Diabetes Program Team Pager: [phone] (8a-10p)

## 2013-09-09 NOTE — Progress Notes (Signed)
eLink Physician-Brief Progress Note Patient Name: [name] Ross DOB: 08/30/1985 MRN: [id number]  Date of Service  09/09/2013   HPI/Events of Note  Hypokalemia   eICU Interventions  Potassium replaced   Intervention Category Minor Interventions: Electrolytes abnormality - evaluation and management  DETERDING,ELIZABETH 09/09/2013, 3:14 AM

## 2013-09-09 NOTE — Progress Notes (Signed)
[name] Ross 1228 TRANSFERRED TO 1412- NO APPARENT CHANGE. FEM. LINE D/C'D- DDI. REPORT GIVEN TO JENNIFER RN.

## 2013-09-09 NOTE — Progress Notes (Signed)
[facility] PROGRESS NOTE  [name] Ross ZOX:[id number] DOB: [birth year] DOA: 09/03/2013 PCP: Nicki Reaper, NP  Assessment/Plan: 1-acute resp failure requiring intubation: -due to profound acidosis and AMS/encephalopahty from DKA -status post extubation on 3/24; now doing ok and w/o resp complaints.  2-cardiogenic shock vs septic shock: now resolved. -continue current antibiotics X1 more day and narrow base on cx's results -BP stable; will hold amlodipine for now  3-Hypokalemia: will check Mg and continue repletion  4-diabetic gastroparesis: no N/V at this moment. -monitor closely and restart reglan if needed -tolerating diet currently  5-Anemia and thrombocytopenia: -follow DIC and HITT Panel -follow CBC for platelets and Hgb trend -component of anemia of chronic disease and iron deficiency due to menstruating state  6-DM type 1 and DKA: now essentially resolved -will continue lantus and SSI -patient on modified carb diet  7-acute on chronic renal failure: stage 3 at baseline -improving and almost back to baseline -will monitor -restart sodium bicarbonate  8-GERD: continue PPI  DVT: SCD's   Code Status: Full Family Communication: no family at bedside  Disposition Plan: will transfer to telemetry bed   Consultants:  PCCM   Procedures and significant events:  See below for x-ray reports  UDS 3/19>>neg   TTE 3/21 >> intact LV fxn without evidence PAH   3/23 Insulin drip off. Gap closing   3/24 off insulin drip. Wide awake, gap 16   Antibiotics:  Zosyn   HPI/Subjective: Afebrile; feeling better; denies CP, nausea or vomiting  Objective: Filed Vitals:   09/09/13 0400  BP: 107/63  Pulse: 76  Temp: 97.2 F (36.2 C)  Resp: 13    Intake/Output Summary (Last 24 hours) at 09/09/13 1007 Last data filed at 09/09/13 0530  Gross per 24 hour  Intake   1340 ml  Output   2200 ml  Net   -860 ml   Filed Weights   09/03/13 2200 09/07/13  0400  Weight: 57.7 kg (127 lb 3.3 oz) 68.3 kg (150 lb 9.2 oz)    Exam:   General:  NAD, afebrile, feeling better;   Cardiovascular: S1 and S2, no rubs or gallops  Respiratory: CTA bilaterally  Abdomen: soft, NT, ND, positive BS  Musculoskeletal: 1-2 ++ edema, no cyanosis or clubbing    Data Reviewed: Basic Metabolic Panel:  Recent Labs Lab 09/03/13 1820 09/03/13 2003  09/07/13 0230  09/07/13 1415 09/08/13 0502 09/08/13 1520 09/09/13 0200 09/09/13 0500  NA 120* 133*  < > 143  < > 143 144 144 144 145  K 7.0* 5.5*  < > 4.2  < > 4.2 3.5* 3.6* 3.4* 3.4*  CL 74* 91*  < > 106  < > 106 109 109 108 110  CO2 <7* <7*  < > 19  < > 19 21 20 21 21   GLUCOSE 1594* 1226*  < > 190*  < > 281* 118* 142* 91 90  BUN 97* 84*  < > 48*  < > 46* 40* 35* 32* 32*  CREATININE 4.62* 3.88*  < > 4.14*  < > 4.07* 3.89* 3.82* 3.80* 3.73*  CALCIUM 9.1 6.8*  < > 8.0*  < > 8.1* 8.3* 8.3* 8.2* 8.1*  MG  --  2.6*  --  1.4*  --   --  1.7  --   --   --   PHOS  --   --   --  4.1  --   --  4.5  --   --   --   < > =  values in this interval not displayed. Liver Function Tests:  Recent Labs Lab 09/03/13 1820  AST 193*  ALT 93*  ALKPHOS 118*  BILITOT 0.4  PROT 6.8  ALBUMIN 3.4*   CBC:  Recent Labs Lab 09/03/13 1820 09/03/13 2003 09/05/13 0442 09/06/13 0220 09/06/13 1111 09/07/13 0230 09/09/13 0500  WBC 21.0* 9.8 10.1 7.4  --  6.0 3.7*  NEUTROABS 13.0*  --  7.1  --   --   --   --   HGB 10.1* 8.2* 8.3* 7.3*  --  7.6* 7.7*  HCT 37.1 27.3* 22.8* 20.0*  --  21.7* 22.6*  MCV 115.2* 106.2* 85.1 84.7  --  88.6 90.4  PLT 378 222 161 118* 93* 88* 87*   Cardiac Enzymes:  Recent Labs Lab 09/03/13 2230 09/03/13 2359  CKTOTAL 71  --   CKMB 4.6*  --   TROPONINI  --  <0.30   CBG:  Recent Labs Lab 09/08/13 1547 09/08/13 1921 09/08/13 2301 09/09/13 0350 09/09/13 0805  GLUCAP 123* 117* 101* 82 98    Recent Results (from the past 240 hour(s))  CULTURE, BLOOD (ROUTINE X 2)     Status: None    Collection Time    09/03/13  7:00 PM      Result Value Ref Range Status   Specimen Description BLOOD FEMORAL   Final   Special Requests BOTTLES DRAWN AEROBIC AND ANAEROBIC   Final   Culture  Setup Time     Final   Value: 09/04/2013 02:11     Performed at [facility]   Culture     Final   Value:        BLOOD CULTURE RECEIVED NO GROWTH TO DATE CULTURE WILL BE HELD FOR 5 DAYS BEFORE ISSUING A FINAL NEGATIVE REPORT     Performed at [facility]   Report Status PENDING   Incomplete  CULTURE, BLOOD (ROUTINE X 2)     Status: None   Collection Time    09/03/13  8:03 PM      Result Value Ref Range Status   Specimen Description BLOOD RIGHT ARM   Final   Special Requests BOTTLES DRAWN AEROBIC AND ANAEROBIC 4CC   Final   Culture  Setup Time     Final   Value: 09/04/2013 02:09     Performed at [facility]   Culture     Final   Value:        BLOOD CULTURE RECEIVED NO GROWTH TO DATE CULTURE WILL BE HELD FOR 5 DAYS BEFORE ISSUING A FINAL NEGATIVE REPORT     Performed at [facility]   Report Status PENDING   Incomplete  MRSA PCR SCREENING     Status: None   Collection Time    09/03/13 10:27 PM      Result Value Ref Range Status   MRSA by PCR NEGATIVE  NEGATIVE Final   Comment:            The GeneXpert MRSA Assay (FDA     approved for NASAL specimens     only), is one component of a     comprehensive MRSA colonization     surveillance program. It is not     intended to diagnose MRSA     infection nor to guide or     monitor treatment for     MRSA infections.  CULTURE, RESPIRATORY (NON-EXPECTORATED)     Status: None   Collection Time  09/04/13  8:11 AM      Result Value Ref Range Status   Specimen Description TRACHEAL ASPIRATE   Final   Special Requests Immunocompromised   Final   Gram Stain     Final   Value: NO WBC SEEN     NO SQUAMOUS EPITHELIAL CELLS SEEN     NO ORGANISMS SEEN     [facility]   Culture     Final    Value: FEW CANDIDA ALBICANS     [facility]formed at [facility]   Report Status 09/06/2013 FINAL   Final     Studies: Dg Chest Port 1 View  09/09/2013   CLINICAL DATA:  Extubated.  Evaluate for atelectasis.  EXAM: PORTABLE CHEST - 1 VIEW  COMPARISON:  DG CHEST 1V PORT dated 09/08/2013  FINDINGS: Extubation and removal of nasogastric tube. Patient rotated to the right. Normal heart size. Small right pleural effusion is new. No pneumothorax. Patchy interstitial and airspace disease is relatively diffuse. Slightly greater on the right. Right hemidiaphragm elevation is moderate and chronic.  IMPRESSION: Extubation with worsened aeration. Interstitial and airspace disease. Suspect mild pulmonary edema. Airspace disease could represent atelectasis or developing infection.  New small right pleural effusion.   Electronically Signed   By: Jeronimo GreavesKyle  Talbot M.D.   On: 09/09/2013 08:10   Dg Chest Port 1 View  09/08/2013   CLINICAL DATA:  Diabetic ketoacidosis.  Shock.  EXAM: PORTABLE CHEST - 1 VIEW  COMPARISON:  09/06/2013 and 09/04/2013  FINDINGS: Endotracheal tube and NG tube appear in good position. There is increasing density at the lung bases consistent with atelectasis. Heart size and pulmonary vascularity are normal.  IMPRESSION: Increasing slight atelectasis at the lung bases.   Electronically Signed   By: Geanie CooleyJim  Maxwell M.D.   On: 09/08/2013 07:40    Scheduled Meds: . antiseptic oral rinse  15 mL Mouth Rinse QID  . insulin aspart  1-3 Units Subcutaneous 6 times per day  . insulin glargine  8 Units Subcutaneous Daily  . pantoprazole  40 mg Oral Daily  . piperacillin-tazobactam (ZOSYN)  IV  2.25 g Intravenous 4 times per day  . potassium chloride  10 mEq Intravenous Q1 Hr x 4   Continuous Infusions: . sodium chloride 500 mL (09/09/13 0930)    Time spent: >30 minutes   Vassie LollMadera, Teonia Yager  [facility] Pager [phone]. If 7PM-7AM, please contact night-coverage at [email or web address], password  [facility] 09/09/2013, 10:07 AM  LOS: 6 days

## 2013-09-10 DIAGNOSIS — R112 Nausea with vomiting, unspecified: Secondary | ICD-10-CM

## 2013-09-10 LAB — GLUCOSE, CAPILLARY
GLUCOSE-CAPILLARY: 101 mg/dL — AB (ref 70–99)
GLUCOSE-CAPILLARY: 121 mg/dL — AB (ref 70–99)
GLUCOSE-CAPILLARY: 51 mg/dL — AB (ref 70–99)
GLUCOSE-CAPILLARY: 56 mg/dL — AB (ref 70–99)
Glucose-Capillary: 118 mg/dL — ABNORMAL HIGH (ref 70–99)
Glucose-Capillary: 157 mg/dL — ABNORMAL HIGH (ref 70–99)
Glucose-Capillary: 53 mg/dL — ABNORMAL LOW (ref 70–99)
Glucose-Capillary: 69 mg/dL — ABNORMAL LOW (ref 70–99)
Glucose-Capillary: 87 mg/dL (ref 70–99)
Glucose-Capillary: 93 mg/dL (ref 70–99)

## 2013-09-10 LAB — CULTURE, BLOOD (ROUTINE X 2)
CULTURE: NO GROWTH
CULTURE: NO GROWTH

## 2013-09-10 LAB — BASIC METABOLIC PANEL
BUN: 27 mg/dL — ABNORMAL HIGH (ref 6–23)
CALCIUM: 7.9 mg/dL — AB (ref 8.4–10.5)
CO2: 21 meq/L (ref 19–32)
Chloride: 108 mEq/L (ref 96–112)
Creatinine, Ser: 3.29 mg/dL — ABNORMAL HIGH (ref 0.50–1.10)
GFR calc Af Amer: 21 mL/min — ABNORMAL LOW (ref >90)
GFR, EST NON AFRICAN AMERICAN: 18 mL/min — AB (ref >90)
Glucose, Bld: 109 mg/dL — ABNORMAL HIGH (ref 70–99)
Potassium: 3.9 mEq/L (ref 3.7–5.3)
SODIUM: 141 meq/L (ref 137–147)

## 2013-09-10 LAB — HEPARIN INDUCED THROMBOCYTOPENIA PNL
HEPARIN INDUCED PLT AB: POSITIVE
Patient O.D.: 1.209
UFH High Dose UFH H: 96 % Release
UFH Low Dose 0.1 IU/mL: 93 % Release
UFH Low Dose 0.5 IU/mL: 91 % Release

## 2013-09-10 MED ORDER — PIPERACILLIN-TAZOBACTAM 3.375 G IVPB
3.3750 g | Freq: Three times a day (TID) | INTRAVENOUS | Status: DC
  Filled 2013-09-10: qty 50

## 2013-09-10 MED ORDER — FERROUS SULFATE 325 (65 FE) MG PO TABS
325.0000 mg | ORAL_TABLET | Freq: Two times a day (BID) | ORAL | Status: DC
  Administered 2013-09-11 – 2013-09-12 (×3): 325 mg via ORAL
  Filled 2013-09-10 (×6): qty 1

## 2013-09-10 MED ORDER — LEVOFLOXACIN 500 MG PO TABS
500.0000 mg | ORAL_TABLET | ORAL | Status: DC
  Administered 2013-09-10 – 2013-09-12 (×2): 500 mg via ORAL
  Filled 2013-09-10 (×2): qty 1

## 2013-09-10 MED ORDER — GLUCOSE 40 % PO GEL
1.0000 | ORAL | Status: DC | PRN
  Administered 2013-09-10: 37.5 g via ORAL
  Filled 2013-09-10: qty 1

## 2013-09-10 NOTE — Progress Notes (Signed)
Hypoglycemic Event  CBG: 69  Treatment: 15 GM carbohydrate snack  Symptoms: None  Follow-up CBG: Time: 2308 CBG Result: 56  Possible Reasons for Event: Inadequate meal intake  Comments/MD notified; Craige CottaKirby, NP    Styles Fambro Y  Remember to initiate Hypoglycemia Order Set & complete

## 2013-09-10 NOTE — Progress Notes (Signed)
Hypoglycemic Event  CBG: 53  Treatment: 15 GM carbohydrate snack  Symptoms: None  Follow-up CBG: Time1650 CBG Result51  Possible Reasons for Event: Inadequate meal intake  Comments/MD notified:Dalbert BatmanMadera    Shelia Kingsberry, Rinaldo CloudPamela D  Remember to initiate Hypoglycemia Order Set & complete

## 2013-09-10 NOTE — Progress Notes (Signed)
ANTIBIOTIC CONSULT NOTE - FOLLOW UP  Pharmacy Consult for zosyn Indication: r/o sepsis secondary to suspected intra-abdominal infection vs pneumonia  No Known Allergies  Patient Measurements: Height: 5\' 5"  (165.1 cm) Weight: 150 lb 9.2 oz (68.3 kg) IBW/kg (Calculated) : 57 Adjusted Body Weight:   Vital Signs: Temp: 97.7 F (36.5 C) (03/26 0300) Temp src: Oral (03/26 0300) BP: 118/81 mmHg (03/26 0300) Pulse Rate: 79 (03/26 0300) Intake/Output from previous day: 03/25 0701 - 03/26 0700 In: 1450 [P.O.:600; I.V.:500; IV Piggyback:350] Out: 950 [Urine:950] Intake/Output from this shift:    Labs:  Recent Labs  09/09/13 0500 09/09/13 1400 09/10/13 0505  WBC 3.7*  --   --   HGB 7.7*  --   --   PLT 87*  --   --   CREATININE 3.73* 3.59* 3.29*   Estimated Creatinine Clearance: 23.3 ml/min (by C-G formula based on Cr of 3.29).  Recent Labs  09/07/13 1030  VANCOTROUGH 24.1*     Microbiology: Recent Results (from the past 720 hour(s))  CULTURE, BLOOD (ROUTINE X 2)     Status: None   Collection Time    09/03/13  7:00 PM      Result Value Ref Range Status   Specimen Description BLOOD FEMORAL   Final   Special Requests BOTTLES DRAWN AEROBIC AND ANAEROBIC   Final   Culture  Setup Time     Final   Value: 09/04/2013 02:11     Performed at [facility]   Culture     Final   Value:        BLOOD CULTURE RECEIVED NO GROWTH TO DATE CULTURE WILL BE HELD FOR 5 DAYS BEFORE ISSUING A FINAL NEGATIVE REPORT     Performed at [facility]   Report Status PENDING   Incomplete  CULTURE, BLOOD (ROUTINE X 2)     Status: None   Collection Time    09/03/13  8:03 PM      Result Value Ref Range Status   Specimen Description BLOOD RIGHT ARM   Final   Special Requests BOTTLES DRAWN AEROBIC AND ANAEROBIC 4CC   Final   Culture  Setup Time     Final   Value: 09/04/2013 02:09     Performed at [facility]   Culture     Final   Value:        BLOOD CULTURE RECEIVED  NO GROWTH TO DATE CULTURE WILL BE HELD FOR 5 DAYS BEFORE ISSUING A FINAL NEGATIVE REPORT     Performed at [facility]   Report Status PENDING   Incomplete  MRSA PCR SCREENING     Status: None   Collection Time    09/03/13 10:27 PM      Result Value Ref Range Status   MRSA by PCR NEGATIVE  NEGATIVE Final   Comment:            The GeneXpert MRSA Assay (FDA     approved for NASAL specimens     only), is one component of a     comprehensive MRSA colonization     surveillance program. It is not     intended to diagnose MRSA     infection nor to guide or     monitor treatment for     MRSA infections.  CULTURE, RESPIRATORY (NON-EXPECTORATED)     Status: None   Collection Time    09/04/13  8:11 AM      Result Value Ref  Range Status   Specimen Description TRACHEAL ASPIRATE   Final   Special Requests Immunocompromised   Final   Gram Stain     Final   Value: NO WBC SEEN     NO SQUAMOUS EPITHELIAL CELLS SEEN     NO ORGANISMS SEEN     [facility]   Culture     Final   Value: FEW CANDIDA ALBICANS     [facility]   Report Status 09/06/2013 FINAL   Final    Anti-infectives   Start     Dose/Rate Route Frequency Ordered Stop   09/10/13 1400  piperacillin-tazobactam (ZOSYN) IVPB 3.375 g     3.375 g 12.5 mL/hr over 240 Minutes Intravenous 3 times per day 09/10/13 0748     09/07/13 1200  piperacillin-tazobactam (ZOSYN) IVPB 2.25 g  Status:  Discontinued     2.25 g 100 mL/hr over 30 Minutes Intravenous 4 times per day 09/07/13 0935 09/10/13 0747   09/05/13 1200  vancomycin (VANCOCIN) 500 mg in sodium chloride 0.9 % 100 mL IVPB  Status:  Discontinued     500 mg 100 mL/hr over 60 Minutes Intravenous Every 24 hours 09/04/13 1148 09/07/13 1001   09/04/13 1800  cefTRIAXone (ROCEPHIN) 1 g in dextrose 5 % 50 mL IVPB  Status:  Discontinued     1 g 100 mL/hr over 30 Minutes Intravenous Every 24 hours 09/04/13 0610 09/04/13 1117   09/04/13 1300   vancomycin (VANCOCIN) 1,500 mg in sodium chloride 0.9 % 500 mL IVPB     1,500 mg 250 mL/hr over 120 Minutes Intravenous  Once 09/04/13 1146 09/04/13 1415   09/04/13 1200  piperacillin-tazobactam (ZOSYN) IVPB 3.375 g  Status:  Discontinued     3.375 g 12.5 mL/hr over 240 Minutes Intravenous Every 8 hours 09/04/13 1135 09/07/13 0936   09/04/13 0615  levofloxacin (LEVAQUIN) IVPB 750 mg  Status:  Discontinued     750 mg 100 mL/hr over 90 Minutes Intravenous Daily 09/04/13 0610 09/04/13 1117   09/03/13 1900  cefTRIAXone (ROCEPHIN) 1 g in dextrose 5 % 50 mL IVPB     1 g 100 mL/hr over 30 Minutes Intravenous  Once 09/03/13 1858 09/03/13 2005      Assessment: 28 year old diabetic female found unresponsive on 3/19 and admitted. Initial glucose 1594, WBC 21, PH un-measurable, +serum Ketones, lactic acid >11 w/ early RLL infiltrate. PCCM asked to admit for DKA and possible septic shock. Hx IDDM, CKD. Patient originally started on ceftriaxone and then levofloxacin for suspected pneumonia. Pharmacy has now been consulted to dose vancomycin and Zosyn for r/o sepsis secondary to questionable intra-abdominal infection  3/19 >> ceftriaxone x1 3/20 >> levofloxacin x1 3/20 >> Zosyn >> 3/20 >> vancomycin >>   Tmax: now afebrile WBCs: 3.7 Renal: AoC CKD, SCr trending down, today 3.29 (baseline ~1.7), CrCl 23 ml/min CG, UOP improved Lactic acid: 2.5 (3/21), 8.6 (3/20), 11.2 (3/19) PCT: 39.9 (3/21), 29.9 (3/20), 5.6 (3/19)  3/19 blood x2: ngtd 3/19 MRSA PCR (-) 3/19 S.pneumo Ur Ag: negative 3/19 Legionella Ur Ag: negative 3/20 sputum: few c. Albicans   Goal of Therapy:  Vancomycin trough level 15-20 mcg/ml  Plan:   Change to Zosyn 3.375g IV q8h (infuse over 4 hours) for improved renal function  F/u plans to narrow abx and duration of therapy   Haynes Hoehnolleen Jeremaine Maraj, PharmD, BCPS 09/10/2013, 7:51 AM  Pager: [phone]

## 2013-09-10 NOTE — Care Management Note (Addendum)
    Page 1 of 2   09/11/2013     3:07:55 PM   CARE MANAGEMENT NOTE 09/11/2013  Patient:  [name] Ross,[name] Ross   Account Number:  [id number]  Date Initiated:  09/04/2013  Documentation initiated by:  DAVIS,RHONDA  Subjective/Objective Assessment:   89 yo found down in her home by a friend who called ems and then left.  hyperglycemic event, resp failure, pt on iv meds, art.line inserted, and intubated     Action/Plan:   tbd=gransparents came to hospital when late afternoon on 082015/[place] lives in [place] [facility]   Anticipated DC Date:  09/11/2013   Anticipated DC Plan:  HOME W HOME HEALTH SERVICES  In-house referral  NA  NA      DC Planning Services  CM consult      PAC Choice  NA   Choice offered to / List presented to:  C-1 Patient   DME arranged  NA      DME agency  NA     HH arranged  NA      HH agency  NA   Status of service:  In process, will continue to follow Medicare Important Message given?  NA - LOS <3 / Initial given by admissions (If response is "NO", the following Medicare IM given date fields will be blank) Date Medicare IM given:   Date Additional Medicare IM given:    Discharge Disposition:    Per UR Regulation:  Reviewed for med. necessity/level of care/duration of stay  If discussed at Long Length of Stay Meetings, dates discussed:   09/10/2013    Comments:  09/11/13 [name] Sciortino RN,BSN NCM [phone] FAXED W/CONFIRMATION HHRN ORDER TO FAX#[phone]-[name] Ross AWARE OF D/C TOMORROW IF MED STABLE.PLEASE CALL #[phone],&  FAX D/C SUMMARY @ D/C.  09/10/13 [name] Landgrebe RN,BSN NCM [phone] TRANSFER FROM SDU.DKA-INSULIN ADJUSTMENTS.[facility] CHOSEN FOR HHC-HHRN,FAXED FACE SHHET,H&P W/CONFIRMATION TO #[phone]-[name] Ross AWARE OF REFERRAL-AWAIT FINAL HHRN ORDER.  [id number]/Rhonda Earlene Plateravis, RN, BSN, [place] [phone] Chart Reviewed for discharge and hospital needs. Discharge needs at time of review:  None present will follow for  needs. Review of patient progress due on [id number].

## 2013-09-10 NOTE — Progress Notes (Signed)
Came back to see patient to discuss and explain [facility] Care Management services at bedside. Confirmed her [place] as [place]. [facility] Nicki Reaperegina Baity, NP. Patient's current PCP is not a [facility] provider. Therefore, [facility] at this [facility]. Will make inpatient RN case manager aware.  Raiford NobleAtika Hall, MSN- RN,BSN- [facility] Care [facility] Hospital Liaison515 [phone]

## 2013-09-10 NOTE — Progress Notes (Signed)
Hypoglycemic Event  CBG:51  Treatment: 15 GM carbohydrate snack  Symptoms: None  Follow-up CBG: Time 1710 CBG Result:84  Possible Reasons for Event: Inadequate meal intake  Comments/MD notified:*yes**    Kathie DikeGarman, Lilibeth Opie D  Remember to initiate Hypoglycemia Order Set & complete

## 2013-09-10 NOTE — Progress Notes (Signed)
[facility] PROGRESS NOTE  [name] Ross QMV:[id number] DOB: 1986-02-23 DOA: 09/03/2013 PCP: Nicki Reaper, NP  Assessment/Plan: 1-acute resp failure requiring intubation: -due to profound acidosis and AMS/encephalopahty from DKA -status post extubation on 3/24; now doing ok and w/o resp complaints. -good air movement on RA  2-cardiogenic shock vs septic shock: now resolved. -no specific organism isolated; patient afebrile and non-toxic -will change antibiotics to levaquin Q48 hours for 3 more doses and complete a total of 10 days of antibiotics -BP remains stable; will hold amlodipine for now  3-Hypokalemia: repleted and Mg WNL -will monitor  4-diabetic gastroparesis: no N/V at this moment. -monitor closely and restart reglan if needed -tolerating diet currently  5-Anemia and thrombocytopenia: -follow DIC and HITT Panel -follow CBC for platelets and Hgb trend -component of anemia of chronic disease and iron deficiency due to menstruating state also contributing -no need for transfusion at this moment.  6-DM type 1 and DKA: now essentially resolved -will continue lantus and SSI -patient on modified carb diet -A1C 9.1  7-acute on chronic renal failure: stage 3 at baseline -continue improving -patient encourage to drink plenty of water -will monitor -continue sodium bicarbonate  8-GERD: continue PPI  DVT: SCD's   Code Status: Full Family Communication: no family at bedside  Disposition Plan: will d/c telemetry; home on 3/27 if remains stable   Consultants:  PCCM   Procedures and significant events:  See below for x-ray reports  UDS 3/19>>neg   TTE 3/21 >> intact LV fxn without evidence PAH   3/23 Insulin drip off. Gap closing   3/24 off insulin drip. Wide awake, gap 16   Antibiotics:  Zosyn   levaquin  HPI/Subjective: Afebrile; feeling better; denies CP, nausea or vomiting. Patient feeling great and tolerating  diet  Objective: Filed Vitals:   09/10/13 0300  BP: 118/81  Pulse: 79  Temp: 97.7 F (36.5 C)  Resp: 14    Intake/Output Summary (Last 24 hours) at 09/10/13 0906 Last data filed at 09/10/13 0600  Gross per 24 hour  Intake   1360 ml  Output    950 ml  Net    410 ml   Filed Weights   09/03/13 2200 09/07/13 0400  Weight: 57.7 kg (127 lb 3.3 oz) 68.3 kg (150 lb 9.2 oz)    Exam:   General:  NAD, afebrile, feeling better;   Cardiovascular: S1 and S2, no rubs or gallops  Respiratory: CTA bilaterally  Abdomen: soft, NT, ND, positive BS  Musculoskeletal: 1-2 ++ edema, no cyanosis or clubbing    Data Reviewed: Basic Metabolic Panel:  Recent Labs Lab 09/03/13 1820 09/03/13 2003  09/07/13 0230  09/08/13 0502 09/08/13 1520 09/09/13 0200 09/09/13 0500 09/09/13 1400 09/10/13 0505  NA 120* 133*  < > 143  < > 144 144 144 145 144 141  K 7.0* 5.5*  < > 4.2  < > 3.5* 3.6* 3.4* 3.4* 3.9 3.9  CL 74* 91*  < > 106  < > 109 109 108 110 112 108  CO2 <7* <7*  < > 19  < > 21 20 21 21 22 21   GLUCOSE 1594* 1226*  < > 190*  < > 118* 142* 91 90 70 109*  BUN 97* 84*  < > 48*  < > 40* 35* 32* 32* 30* 27*  CREATININE 4.62* 3.88*  < > 4.14*  < > 3.89* 3.82* 3.80* 3.73* 3.59* 3.29*  CALCIUM 9.1 6.8*  < > 8.0*  < > 8.3* 8.3*  8.2* 8.1* 8.1* 7.9*  MG  --  2.6*  --  1.4*  --  1.7  --   --   --   --   --   PHOS  --   --   --  4.1  --  4.5  --   --   --   --   --   < > = values in this interval not displayed. Liver Function Tests:  Recent Labs Lab 09/03/13 1820  AST 193*  ALT 93*  ALKPHOS 118*  BILITOT 0.4  PROT 6.8  ALBUMIN 3.4*   CBC:  Recent Labs Lab 09/03/13 1820 09/03/13 2003 09/05/13 0442 09/06/13 0220 09/06/13 1111 09/07/13 0230 09/09/13 0500  WBC 21.0* 9.8 10.1 7.4  --  6.0 3.7*  NEUTROABS 13.0*  --  7.1  --   --   --   --   HGB 10.1* 8.2* 8.3* 7.3*  --  7.6* 7.7*  HCT 37.1 27.3* 22.8* 20.0*  --  21.7* 22.6*  MCV 115.2* 106.2* 85.1 84.7  --  88.6 90.4  PLT 378  222 161 118* 93* 88* 87*   Cardiac Enzymes:  Recent Labs Lab 09/03/13 2230 09/03/13 2359  CKTOTAL 71  --   CKMB 4.6*  --   TROPONINI  --  <0.30   CBG:  Recent Labs Lab 09/09/13 1611 09/09/13 1641 09/09/13 1958 09/10/13 0010 09/10/13 0422  GLUCAP 63* 111* 147* 121* 118*    Recent Results (from the past 240 hour(s))  CULTURE, BLOOD (ROUTINE X 2)     Status: None   Collection Time    09/03/13  7:00 PM      Result Value Ref Range Status   Specimen Description BLOOD FEMORAL   Final   Special Requests BOTTLES DRAWN AEROBIC AND ANAEROBIC 5ML   Final   Culture  Setup Time     Final   Value: 09/04/2013 02:11     [facility]formed [facility]   Culture     Final   Value: NO GROWTH 5 DAYS     [facility]   Report Status 09/10/2013 FINAL   Final  CULTURE, BLOOD (ROUTINE X 2)     Status: None   Collection Time    09/03/13  8:03 PM      Result Value Ref Range Status   Specimen Description BLOOD RIGHT ARM   Final   Special Requests BOTTLES DRAWN AEROBIC AND ANAEROBIC 4CC   Final   Culture  Setup Time     Final   Value: 09/04/2013 02:09     [facility]formed [facility]   Culture     Final   Value: NO GROWTH 5 DAYS     [facility]formed at [facility]   Report Status 09/10/2013 FINAL   Final  MRSA PCR SCREENING     Status: None   Collection Time    09/03/13 10:27 PM      Result Value Ref Range Status   MRSA by PCR NEGATIVE  NEGATIVE Final   Comment:            The GeneXpert MRSA Assay (FDA     approved for NASAL specimens     only), is one component of a     comprehensive MRSA colonization     surveillance program. It is not     intended to diagnose MRSA     infection nor to guide or     monitor  treatment for     MRSA infections.  CULTURE, RESPIRATORY (NON-EXPECTORATED)     Status: None   Collection Time    09/04/13  8:11 AM      Result Value Ref Range Status   Specimen Description TRACHEAL ASPIRATE   Final   Special Requests  Immunocompromised   Final   Gram Stain     Final   Value: NO WBC SEEN     NO SQUAMOUS EPITHELIAL CELLS SEEN     NO ORGANISMS SEEN     Performed at [facility]   Culture     Final   Value: FEW CANDIDA ALBICANS     Performed at [facility]   Report Status 09/06/2013 FINAL   Final     Studies: Dg Chest Port 1 View  09/09/2013   CLINICAL DATA:  Extubated.  Evaluate for atelectasis.  EXAM: PORTABLE CHEST - 1 VIEW  COMPARISON:  DG CHEST 1V PORT dated 09/08/2013  FINDINGS: Extubation and removal of nasogastric tube. Patient rotated to the right. Normal heart size. Small right pleural effusion is new. No pneumothorax. Patchy interstitial and airspace disease is relatively diffuse. Slightly greater on the right. Right hemidiaphragm elevation is moderate and chronic.  IMPRESSION: Extubation with worsened aeration. Interstitial and airspace disease. Suspect mild pulmonary edema. Airspace disease could represent atelectasis or developing infection.  New small right pleural effusion.   Electronically Signed   By: Jeronimo Greaves M.D.   On: 09/09/2013 08:10    Scheduled Meds: . antiseptic oral rinse  15 mL Mouth Rinse QID  . insulin aspart  0-9 Units Subcutaneous TID WC  . insulin glargine  10 Units Subcutaneous Daily  . levofloxacin  500 mg Oral Q48H  . pantoprazole  40 mg Oral Daily  . pneumococcal 23 valent vaccine  0.5 mL Intramuscular Tomorrow-1000  . sodium bicarbonate  650 mg Oral BID   Continuous Infusions: . sodium chloride 500 mL (09/09/13 0930)    Time spent: >30 minutes   Vassie Loll  [facility] Pager [phone]. If 7PM-7AM, please contact night-coverage at [email or web address], password [facility] 09/10/2013, 9:06 AM  LOS: 7 days

## 2013-09-10 NOTE — Progress Notes (Signed)
Came to bedside to speak with [facility] about [facility]. However, she was sleeping soundly. Will come back at later time.  Raiford NobleAtika Hall, MSN- RN,BSN- [facility] Hospital [phone]

## 2013-09-10 NOTE — Evaluation (Signed)
Physical Therapy One Time Evaluation Patient Details Name: [name] Ross MRN: [id number] DOB: 11/02/1985 Today's Date: 09/10/2013   History of Present Illness  Pt is a 28 year old female admitted with profound acidosis and AMS/encephalopahty from DKA.  Pt with acute respiratory failure requiring intubation however extubated 3/24.  Clinical Impression  Patient evaluated by Physical Therapy with no further acute PT needs identified. All education has been completed and the patient has no further questions. Pt ambulated in hallway with slight unsteadiness however improved upon returning to room.  Pt c/o "heavy legs" and feeling swollen so encouraged to perform ankle pumps, LAQ, and heel slides as tolerated.  See below for any follow-up Physial Therapy or equipment needs. PT is signing off. Thank you for this referral.     Follow Up Recommendations No PT follow up    Equipment Recommendations  None recommended by PT    Recommendations for Other Services       Precautions / Restrictions Precautions Precautions: None Restrictions Weight Bearing Restrictions: No      Mobility  Bed Mobility Overal bed mobility: Modified Independent                Transfers Overall transfer level: Modified independent                  Ambulation/Gait Ambulation/Gait assistance: Supervision Ambulation Distance (Feet): 250 Feet Assistive device: None Gait Pattern/deviations: Drifts right/left     General Gait Details: pt with unsteady gait initially however able to self correct and improved with distance, [facility]FL upon returning to room  Stairs            Wheelchair Mobility    Modified Rankin (Stroke Patients Only)       Balance                                     Pertinent Vitals/Pain SpO2 97% room air at rest SpO2 94% room air during ambulation SpO2 95% room air upon sitting in recliner    Home Living Family/patient expects to be discharged  to:: Private residence Living Arrangements: Spouse/significant other   Type of Home: Apartment       Home Layout: One level Home Equipment: None      Prior Function Level of Independence: Independent               Hand Dominance        Extremity/Trunk Assessment   Upper Extremity Assessment: Overall WFL for tasks assessed           Lower Extremity Assessment: Overall WFL for tasks assessed         Communication   Communication: No difficulties  Cognition Arousal/Alertness: Awake/alert Behavior During Therapy: Flat affect Overall Cognitive Status: Within Functional Limits for tasks assessed                      General Comments      Exercises        Assessment/Plan    PT Assessment Patent does not need any further PT services  PT Diagnosis     PT Problem List    PT Treatment Interventions     PT Goals (Current goals can be found in the Care Plan section) Acute Rehab PT Goals PT Goal Formulation: No goals set, d/c therapy    Frequency     Barriers to discharge  End of Session Equipment Utilized During Treatment: Gait belt Activity Tolerance: Patient tolerated treatment well Patient left: in chair         Time: 1610-9604 PT Time Calculation (min): 10 min   Charges:   PT Evaluation $Initial PT Evaluation Tier I: 1 Procedure PT Treatments $Gait Training: 8-22 mins   PT G Codes:          Belton Peplinski,KATHrine E 09/10/2013, 12:18 PM Zenovia Jarred, PT, DPT 09/10/2013 Pager: [phone]

## 2013-09-11 DIAGNOSIS — E860 Dehydration: Secondary | ICD-10-CM

## 2013-09-11 LAB — BASIC METABOLIC PANEL
BUN: 22 mg/dL (ref 6–23)
CHLORIDE: 109 meq/L (ref 96–112)
CO2: 22 mEq/L (ref 19–32)
Calcium: 8.3 mg/dL — ABNORMAL LOW (ref 8.4–10.5)
Creatinine, Ser: 2.93 mg/dL — ABNORMAL HIGH (ref 0.50–1.10)
GFR calc Af Amer: 24 mL/min — ABNORMAL LOW (ref >90)
GFR calc non Af Amer: 21 mL/min — ABNORMAL LOW (ref >90)
GLUCOSE: 77 mg/dL (ref 70–99)
Potassium: 3.6 mEq/L — ABNORMAL LOW (ref 3.7–5.3)
Sodium: 143 mEq/L (ref 137–147)

## 2013-09-11 LAB — BLOOD GAS, ARTERIAL
Acid-base deficit: 4.6 mmol/L — ABNORMAL HIGH (ref 0.0–2.0)
Bicarbonate: 18.9 mEq/L — ABNORMAL LOW (ref 20.0–24.0)
Drawn by: 365291
FIO2: 0.3 %
LHR: 20 {breaths}/min
O2 Saturation: 97.4 %
PATIENT TEMPERATURE: 98.6
PEEP: 5 cmH2O
PH ART: 7.413 (ref 7.350–7.450)
Pressure control: 20 cmH2O
TCO2: 18 mmol/L (ref 0–100)
pCO2 arterial: 30.2 mmHg — ABNORMAL LOW (ref 35.0–45.0)
pO2, Arterial: 103 mmHg — ABNORMAL HIGH (ref 80.0–100.0)

## 2013-09-11 LAB — CBC
HCT: 23 % — ABNORMAL LOW (ref 36.0–46.0)
HEMOGLOBIN: 8.1 g/dL — AB (ref 12.0–15.0)
MCH: 31.2 pg (ref 26.0–34.0)
MCHC: 35.2 g/dL (ref 30.0–36.0)
MCV: 88.5 fL (ref 78.0–100.0)
Platelets: 180 10*3/uL (ref 150–400)
RBC: 2.6 MIL/uL — ABNORMAL LOW (ref 3.87–5.11)
RDW: 14.6 % (ref 11.5–15.5)
WBC: 3.2 10*3/uL — ABNORMAL LOW (ref 4.0–10.5)

## 2013-09-11 LAB — GLUCOSE, CAPILLARY
GLUCOSE-CAPILLARY: 55 mg/dL — AB (ref 70–99)
GLUCOSE-CAPILLARY: 59 mg/dL — AB (ref 70–99)
GLUCOSE-CAPILLARY: 128 mg/dL — AB (ref 70–99)
Glucose-Capillary: 180 mg/dL — ABNORMAL HIGH (ref 70–99)
Glucose-Capillary: 121 mg/dL — ABNORMAL HIGH (ref 70–99)

## 2013-09-11 LAB — CLOSTRIDIUM DIFFICILE BY PCR: Toxigenic C. Difficile by PCR: NEGATIVE

## 2013-09-11 MED ORDER — INSULIN GLARGINE 100 UNIT/ML ~~LOC~~ SOLN
8.0000 [IU] | Freq: Every day | SUBCUTANEOUS | Status: DC
  Administered 2013-09-11 – 2013-09-12 (×2): 8 [IU] via SUBCUTANEOUS
  Filled 2013-09-11 (×2): qty 0.08

## 2013-09-11 MED ORDER — GLUCOSE-VITAMIN C 4-6 GM-MG PO CHEW: 4.0000 | CHEWABLE_TABLET | ORAL | Status: DC | PRN

## 2013-09-11 MED ORDER — GLUCOSE-VITAMIN C 4-6 GM-MG PO CHEW
CHEWABLE_TABLET | ORAL | Status: AC
  Administered 2013-09-11: 09:00:00
  Filled 2013-09-11: qty 2

## 2013-09-11 NOTE — Progress Notes (Signed)
Hypoglycemic Event  CBG: 56  Treatment: 1 tube instant glucose  Symptoms: None  Follow-up CBG: Time:2345 CBG Result:157  Possible Reasons for Event: Inadequate meal intake  Comments/MD notified: Craige CottaKirby, NP    Tavari Loadholt Y  Remember to initiate Hypoglycemia Order Set & complete

## 2013-09-11 NOTE — Progress Notes (Signed)
Hypoglycemic Event  CBG: 55  Treatment: 15 GM carbohydrate snack  Symptoms: None  Follow-up CBG: Time:0830 CBG Result:59  Possible Reasons for Event: Inadequate meal intake  Comments/MD notified:Dr. Abigail ButtsMadera    Yumi Insalaco, Cathlean CowerJennifer Ellis  Remember to initiate Hypoglycemia Order Set & complete

## 2013-09-11 NOTE — Progress Notes (Signed)
Inpatient Diabetes Program Recommendations  AACE/ADA: New Consensus Statement on Inpatient Glycemic Control (2013)  Target Ranges:  Prepandial:   less than 140 mg/dL      Peak postprandial:   less than 180 mg/dL (1-2 hours)      Critically ill patients:  140 - 180 mg/dL     Results for [name], Marieme (MRN [id number]) as of 09/11/2013 08:36  Ref. Range 09/10/2013 07:28 09/10/2013 12:22 09/10/2013 16:35 09/10/2013 16:52 09/10/2013 17:16 09/10/2013 22:19 09/10/2013 23:08 09/10/2013 23:55  Glucose-Capillary Latest Range: 70-99 mg/dL 409101 (H) 93 53 (L) 51 (L) 87 69 (L) 56 (L) 157 (H)    Results for [name], Nobie (MRN [id number]) as of 09/11/2013 08:36  Ref. Range 09/11/2013 07:47  Glucose-Capillary Latest Range: 70-99 mg/dL 55 (L)     **Hypoglycemic late yesterday afternoon and also this morning.  **Pt received 10 units Lantus yesterday  **Pt did fairly well on Lantus 8 units daily on 03/24 and 03/25   MD- Please decrease Lantus to 8 units daily   Will follow. Ambrose FinlandJeannine Johnston Amilliana Hayworth RN, MSN, CDE Diabetes Coordinator Inpatient Diabetes Program Team Pager: [phone] (8a-10p)

## 2013-09-11 NOTE — Progress Notes (Signed)
Dr. Gwenlyn PerkingMadera making rounds given update on Pt. Pt's CBG at 7:47am 55 Pt asymptomatic and Carbohydrate snack and breakfast ordered. Recheck CBG at 8:30am 59 after snack and breakfast Pt remainsGlucose without symptoms or complaints. Glucose Tablets given per MD request

## 2013-09-11 NOTE — Progress Notes (Signed)
[facility] PROGRESS NOTE  [name] Ross ZOX:[id number] DOB: August 01, 1985 DOA: 09/03/2013 PCP: Nicki Reaper, NP  Assessment/Plan: 1-acute resp failure requiring intubation: -due to profound acidosis and AMS/encephalopahty from DKA -status post extubation on 3/24; now doing ok and w/o resp complaints. -good air movement on RA  2-cardiogenic shock vs septic shock: now resolved. -no specific organism isolated; patient afebrile and non-toxic -will change antibiotics to levaquin Q48 hours for 3 more doses and complete a total of 10 days of antibiotics -BP remains stable; will continue holding amlodipine for now  3-Hypokalemia: repleted and Mg WNL -will monitor  4-diabetic gastroparesis: no N/V at this moment. -monitor closely and restart reglan if needed -tolerating diet currently  5-Anemia and thrombocytopenia: -follow DIC and HITT Panel -follow CBC for platelets and Hgb trend -component of anemia of chronic disease and iron deficiency due to menstruating state also contributing -no need for transfusion at this moment. -platelets is now 180 today; will repeat in am  6-DM type 1 and DKA: now essentially resolved -will continue lantus (but given hypoglycemia, will reduce to 8 units instead of 10) and SSI -patient on modified carb diet -A1C 9.1  7-acute on chronic renal failure: stage 3 at baseline -continue improving -patient encourage to drink plenty of water -will monitor renal function in am -continue sodium bicarbonate PO  8-GERD: continue PPI  9-loose stools: doubt c. Diff; but since has been on antibiotics will check C. Diff by PCR -if negative will start probiotics  DVT: SCD's   Code Status: Full Family Communication: no family at bedside  Disposition Plan: will d/c telemetry; home on 3/27 if remains stable   Consultants:  PCCM   Procedures and significant events:  See below for x-ray reports  UDS 3/19>>neg   TTE 3/21 >> intact LV fxn without  evidence PAH   3/23 Insulin drip off. Gap closing   3/24 off insulin drip. Wide awake, gap 16   Antibiotics:  Zosyn   levaquin  HPI/Subjective: Afebrile; feeling better; denies CP, nausea or vomiting. Overnight with 2 episodes of loose stools and hypoglycemic in am.   Objective: Filed Vitals:   09/11/13 0540  BP: 121/76  Pulse:   Temp:   Resp:     Intake/Output Summary (Last 24 hours) at 09/11/13 0906 Last data filed at 09/11/13 0700  Gross per 24 hour  Intake    650 ml  Output    600 ml  Net     50 ml   Filed Weights   09/03/13 2200 09/07/13 0400  Weight: 57.7 kg (127 lb 3.3 oz) 68.3 kg (150 lb 9.2 oz)    Exam:   General:  NAD, afebrile, feeling better; denies nausea or vomiting  Cardiovascular: S1 and S2, no rubs or gallops  Respiratory: CTA bilaterally  Abdomen: soft, NT, ND, positive BS  Musculoskeletal: 1++ edema, no cyanosis or clubbing    Data Reviewed: Basic Metabolic Panel:  Recent Labs Lab 09/07/13 0230  09/08/13 0502  09/09/13 0200 09/09/13 0500 09/09/13 1400 09/10/13 0505 09/11/13 0335  NA 143  < > 144  < > 144 145 144 141 143  K 4.2  < > 3.5*  < > 3.4* 3.4* 3.9 3.9 3.6*  CL 106  < > 109  < > 108 110 112 108 109  CO2 19  < > 21  < > 21 21 22 21 22   GLUCOSE 190*  < > 118*  < > 91 90 70 109* 77  BUN 48*  < >  40*  < > 32* 32* 30* 27* 22  CREATININE 4.14*  < > 3.89*  < > 3.80* 3.73* 3.59* 3.29* 2.93*  CALCIUM 8.0*  < > 8.3*  < > 8.2* 8.1* 8.1* 7.9* 8.3*  MG 1.4*  --  1.7  --   --   --   --   --   --   PHOS 4.1  --  4.5  --   --   --   --   --   --   < > = values in this interval not displayed. Liver Function Tests: No results found for this basename: AST, ALT, ALKPHOS, BILITOT, PROT, ALBUMIN,  in the last 168 hours CBC:  Recent Labs Lab 09/05/13 0442 09/06/13 0220 09/06/13 1111 09/07/13 0230 09/09/13 0500 09/11/13 0335  WBC 10.1 7.4  --  6.0 3.7* 3.2*  NEUTROABS 7.1  --   --   --   --   --   HGB 8.3* 7.3*  --  7.6* 7.7*  8.1*  HCT 22.8* 20.0*  --  21.7* 22.6* 23.0*  MCV 85.1 84.7  --  88.6 90.4 88.5  PLT 161 118* 93* 88* 87* 180   Cardiac Enzymes: No results found for this basename: CKTOTAL, CKMB, CKMBINDEX, TROPONINI,  in the last 168 hours CBG:  Recent Labs Lab 09/10/13 2308 09/10/13 2355 09/11/13 0747 09/11/13 0822 09/11/13 0848  GLUCAP 56* 157* 55* 59* 128*    Recent Results (from the past 240 hour(s))  CULTURE, BLOOD (ROUTINE X 2)     Status: None   Collection Time    09/03/13  7:00 PM      Result Value Ref Range Status   Specimen Description BLOOD FEMORAL   Final   Special Requests BOTTLES DRAWN AEROBIC AND ANAEROBIC   Final   Culture  Setup Time     Final   Value: 09/04/2013 02:11     Performed at [facility]   Culture     Final   Value: NO GROWTH 5 DAYS     Performed at [facility]   Report Status 09/10/2013 FINAL   Final  CULTURE, BLOOD (ROUTINE X 2)     Status: None   Collection Time    09/03/13  8:03 PM      Result Value Ref Range Status   Specimen Description BLOOD RIGHT ARM   Final   Special Requests BOTTLES DRAWN AEROBIC AND ANAEROBIC 4CC   Final   Culture  Setup Time     Final   Value: 09/04/2013 02:09     Performed at [facility]   Culture     Final   Value: NO GROWTH 5 DAYS     Performed at [facility]   Report Status 09/10/2013 FINAL   Final  MRSA PCR SCREENING     Status: None   Collection Time    09/03/13 10:27 PM      Result Value Ref Range Status   MRSA by PCR NEGATIVE  NEGATIVE Final   Comment:            The GeneXpert MRSA Assay (FDA     approved for NASAL specimens     only), is one component of a     comprehensive MRSA colonization     surveillance program. It is not     intended to diagnose MRSA     infection nor to guide or     monitor treatment for  MRSA infections.  CULTURE, RESPIRATORY (NON-EXPECTORATED)     Status: None   Collection Time    09/04/13  8:11 AM      Result Value Ref Range Status    Specimen Description TRACHEAL ASPIRATE   Final   Special Requests Immunocompromised   Final   Gram Stain     Final   Value: NO WBC SEEN     NO SQUAMOUS EPITHELIAL CELLS SEEN     NO ORGANISMS SEEN     [facility]formed at [facility]   Culture     Final   Value: FEW CANDIDA ALBICANS     Performed at [facility]   Report Status 09/06/2013 FINAL   Final     Studies: No results found.  Scheduled Meds: . antiseptic oral rinse  15 mL Mouth Rinse QID  . ferrous sulfate  325 mg Oral BID WC  . insulin aspart  0-9 Units Subcutaneous TID WC  . insulin glargine  8 Units Subcutaneous Daily  . levofloxacin  500 mg Oral Q48H  . pantoprazole  40 mg Oral Daily  . sodium bicarbonate  650 mg Oral BID   Continuous Infusions:    Time spent: >30 minutes   Vassie LollMadera, Troi Bechtold  [facility] Pager [phone]. If 7PM-7AM, please contact night-coverage at [email or web address], password [email or web address] 09/11/2013, 9:06 AM  LOS: 8 days

## 2013-09-11 NOTE — Progress Notes (Signed)
CBG now 128. Will monitor Pt closely and maintain current plan of care

## 2013-09-12 DIAGNOSIS — I10 Essential (primary) hypertension: Secondary | ICD-10-CM

## 2013-09-12 DIAGNOSIS — N390 Urinary tract infection, site not specified: Secondary | ICD-10-CM

## 2013-09-12 DIAGNOSIS — R6510 Systemic inflammatory response syndrome (SIRS) of non-infectious origin without acute organ dysfunction: Secondary | ICD-10-CM

## 2013-09-12 LAB — CBC
HCT: 23 % — ABNORMAL LOW (ref 36.0–46.0)
Hemoglobin: 7.8 g/dL — ABNORMAL LOW (ref 12.0–15.0)
MCH: 31 pg (ref 26.0–34.0)
MCHC: 33.9 g/dL (ref 30.0–36.0)
MCV: 91.3 fL (ref 78.0–100.0)
PLATELETS: 214 10*3/uL (ref 150–400)
RBC: 2.52 MIL/uL — ABNORMAL LOW (ref 3.87–5.11)
RDW: 14.7 % (ref 11.5–15.5)
WBC: 4.4 10*3/uL (ref 4.0–10.5)

## 2013-09-12 LAB — BASIC METABOLIC PANEL
BUN: 16 mg/dL (ref 6–23)
CALCIUM: 8.6 mg/dL (ref 8.4–10.5)
CO2: 23 mEq/L (ref 19–32)
Chloride: 110 mEq/L (ref 96–112)
Creatinine, Ser: 2.63 mg/dL — ABNORMAL HIGH (ref 0.50–1.10)
GFR, EST AFRICAN AMERICAN: 28 mL/min — AB (ref >90)
GFR, EST NON AFRICAN AMERICAN: 24 mL/min — AB (ref >90)
GLUCOSE: 131 mg/dL — AB (ref 70–99)
POTASSIUM: 3.4 meq/L — AB (ref 3.7–5.3)
SODIUM: 145 meq/L (ref 137–147)

## 2013-09-12 LAB — GLUCOSE, CAPILLARY
GLUCOSE-CAPILLARY: 71 mg/dL (ref 70–99)
GLUCOSE-CAPILLARY: 155 mg/dL — AB (ref 70–99)
GLUCOSE-CAPILLARY: 125 mg/dL — AB (ref 70–99)
GLUCOSE-CAPILLARY: 211 mg/dL — AB (ref 70–99)
Glucose-Capillary: 46 mg/dL — ABNORMAL LOW (ref 70–99)
Glucose-Capillary: 113 mg/dL — ABNORMAL HIGH (ref 70–99)

## 2013-09-12 MED ORDER — PROBIOTIC ACIDOPHILUS PO TABS: 1.0000 | ORAL_TABLET | Freq: Two times a day (BID) | ORAL | Status: AC

## 2013-09-12 MED ORDER — POTASSIUM CHLORIDE CRYS ER 20 MEQ PO TBCR
40.0000 meq | EXTENDED_RELEASE_TABLET | ORAL | Status: AC
  Administered 2013-09-12 (×2): 40 meq via ORAL
  Filled 2013-09-12 (×2): qty 2

## 2013-09-12 MED ORDER — METOCLOPRAMIDE HCL 5 MG/5ML PO SOLN: 5.0000 mg | Freq: Three times a day (TID) | ORAL | Status: AC

## 2013-09-12 MED ORDER — INSULIN DETEMIR 100 UNIT/ML FLEXPEN: 8.0000 [IU] | Freq: Every day | SUBCUTANEOUS | Status: AC

## 2013-09-12 MED ORDER — MIRTAZAPINE 15 MG PO TABS: 15.0000 mg | ORAL_TABLET | Freq: Every day | ORAL | Status: AC

## 2013-09-12 MED ORDER — LEVOFLOXACIN 500 MG PO TABS: 500.0000 mg | ORAL_TABLET | ORAL | Status: AC

## 2013-09-12 MED ORDER — PANTOPRAZOLE SODIUM 40 MG PO TBEC: 40.0000 mg | DELAYED_RELEASE_TABLET | Freq: Every day | ORAL | Status: AC

## 2013-09-12 MED ORDER — FERROUS SULFATE 325 (65 FE) MG PO TABS
325.0000 mg | ORAL_TABLET | Freq: Two times a day (BID) | ORAL | Status: AC
Start: 2013-09-12 — End: ?

## 2013-09-12 MED ORDER — SODIUM BICARBONATE 650 MG PO TABS: 650.0000 mg | ORAL_TABLET | Freq: Two times a day (BID) | ORAL | Status: AC

## 2013-09-12 NOTE — Discharge Summary (Signed)
Physician Discharge Summary  Judieth Mckown WUJ:[id number] DOB: 06-Feb-1986 DOA: 09/03/2013  PCP: Nicki Reaper, NP  Admit date: 09/03/2013 Discharge date: 09/12/2013  Time spent: >30 minutes  Recommendations for Outpatient Follow-up:  BMET to follow electrolytes and renal function CBC to follow Hgb trend and platelets Close follow up of CBG's and diabetes; adjust hypoglycemic regimen  Discharge Diagnoses:  Active Problems:   DKA (diabetic ketoacidoses) Acute resp failure with hypoxia requiring intubation Presumed Aspiration PNA Acute metabolic/toxic encephalopathy Presumed septic shock vs cardiogenic shock (resolved) Gastroparesis Acute on chronic renal failure (stage 3 at baseline) Anemia (AOCD and iron deficiency) 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 Will discharge home with [facility] and close follow up with PCP  Diet recommendation: low sodium and low carb diet  Filed Weights   09/03/13 2200 09/07/13 0400  Weight: 57.7 kg (127 lb 3.3 oz) 68.3 kg (150 lb 9.2 oz)    History of present illness:  28 year old diabetic female. Found unresponsive on 3/19. Initial glucose 1594, WBC 21, PH un-measurable, +serum Ketones, lactic acid >11 w/ early rll infiltrate. PCCM asked to admit for DKA and possible septic shock.   Hospital Course:  1-acute resp failure requiring intubation with presumed aspiration PNA:  -due to profound acidosis, presumed asp PNA and AMS/encephalopahty from DKA  -status post extubation on 3/24; now doing ok and w/o resp complaints.  -good air movement on RA and no wheezing -continue antibiotic therapy to complete treatemnt -no fever -no swallowing abnormalities appreciated during SPL evaluation.  2-cardiogenic shock vs septic shock: now resolved.  -no specific organism isolated; patient afebrile and non-toxic after empirically antibiotic therapy -will change antibiotics to levaquin Q48 hours for  2 more doses at discharge and complete a total of 10 days of antibiotics  -BP remains hemodynamically stable and will discharge gome.; will continue holding amlodipine for now   3-Hypokalemia: repleted and Mg WNL  -will follow electrolytes at discharge  4-diabetic gastroparesis: no N/V at discharge.  -tolerating diet currently  -continue reglan  5-Anemia and thrombocytopenia:  -due to acidosis and HITT Panel  -at discharge platelets back normal range -Hgb stable and no transfusion required -patient discharge on ferrous sulfate due to component of anemia (iron deficiency) due to menstruation  -no signs of overt bleeding  6-DM type 1 and DKA: now resolved  -will continue lantus 8 units and SSI  -patient advise to follow a low carbohydrates diet and to be compliant with medications  -A1C 9.1   7-acute on chronic renal failure: stage 3 at baseline  -continue improving  -patient encourage to drink plenty of water and keep herself well hydrated -will monitor renal function outpatient setting (10 days during follow up) -continue sodium bicarbonate PO   8-GERD: continue PPI   9-loose stools: c. Diff by PCR negative;  -most likely secondary to abx's -will start probiotics for 15 days.  10-HTN: resume amlodipine and low sodium diet  Procedures and significant events:  See below for x-ray reports UDS 3/19>>neg  TTE 3/21 >> intact LV fxn without evidence PAH  3/23 Insulin drip off. Gap closing  3/24 off insulin drip. Wide awake, gap 16  Consultations:  PCCM  Discharge Exam: Filed Vitals:   09/12/13 0500  BP: 123/79  Pulse: 80  Temp: 98.6 F (37 C)  Resp: 16   General: NAD, afebrile, feeling better; denies nausea or vomiting; she is tolerating diet and asking to be discharged.  Cardiovascular: S1 and  S2, no rubs or gallops  Respiratory: CTA bilaterally  Abdomen: soft, NT, mild distension, positive BS  Musculoskeletal: trace edema, no cyanosis or clubbing      Discharge Instructions  Discharge Orders   Future Orders Complete By Expires   Diet - low sodium heart healthy  As directed    Discharge instructions  As directed    Comments:     Take medications as prescribed Follow a low sodium and low carbohydrates diet at discharge Keep yourself well hydrated Follow with PCP in 10 days       Medication List         amLODipine 5 MG tablet  Commonly known as:  NORVASC  Take 5 mg by mouth daily.     ferrous sulfate 325 (65 FE) MG tablet  Take 1 tablet (325 mg total) by mouth 2 (two) times daily with a meal.     insulin aspart 100 UNIT/ML injection  Commonly known as:  novoLOG  - Inject 2 Units into the skin See admin instructions. Inject 2 units twice daily( before breakfast and dinner) and also use according to sliding scale  - Inject 0-15 units into the skin 3 times daily before meals per sliding scale.  -   - CBG < 70: drink juice and or milk and call physician or 911   -   CBG 70 - 120: 0 units     -   CBG > 400 call MD and obtain STAT lab verification     -   CBG 121 - 150: 2 units     -   CBG 151 - 200: 3 units     -   CBG 201 - 250: 5 units     -   CBG 251 - 300: 8 units     -   CBG 301 - 350: 11 units     -   CBG 351 - 400: 15 units     insulin detemir 100 unit/ml Soln  Commonly known as:  LEVEMIR  Inject 0.08 mLs (8 Units total) into the skin daily.     levofloxacin 500 MG tablet  Commonly known as:  LEVAQUIN  Take 1 tablet (500 mg total) by mouth every other day.  Start taking on:  09/14/2013     metoCLOPramide 5 MG/5ML solution  Commonly known as:  REGLAN  Take 5 mLs (5 mg total) by mouth 3 (three) times daily before meals.     mirtazapine 15 MG tablet  Commonly known as:  REMERON  Take 1 tablet (15 mg total) by mouth at bedtime.     pantoprazole 40 MG tablet  Commonly known as:  PROTONIX  Take 1 tablet (40 mg total) by mouth daily at 12 noon.     PROBIOTIC ACIDOPHILUS Tabs  Take 1 tablet by  mouth 2 (two) times daily.     sodium bicarbonate 650 MG tablet  Take 1 tablet (650 mg total) by mouth 2 (two) times daily.       No Known Allergies     Follow-up Information   Follow up with Nicki ReaperBAITY, REGINA, NP. Schedule an appointment as soon as possible for a visit in 10 days.   Specialty:  Internal Medicine   Contact information:   [address] [place] [id number] [phone]        The results of significant diagnostics from this hospitalization (including imaging, microbiology, ancillary and laboratory) are listed below for reference.    Significant  Diagnostic Studies: Ct Head Wo Contrast  09/03/2013   CLINICAL DATA:  Altered mental status.  EXAM: CT HEAD WITHOUT CONTRAST  TECHNIQUE: Contiguous axial images were obtained from the base of the skull through the vertex without intravenous contrast.  COMPARISON:  None.  FINDINGS: The ventricles are normal in size and configuration. No extra-axial fluid collections are identified. The gray-white differentiation is normal. No CT findings for acute intracranial process such as hemorrhage or infarction. No mass lesions. The brainstem and cerebellum are grossly normal.The bony structures are intact. The paranasal sinuses and mastoid air cells are clear. The globes are intact.  IMPRESSION: No acute intracranial findings.   Electronically Signed   By: Loralie Champagne M.D.   On: 09/03/2013 19:14   Dg Chest Port 1 View  09/09/2013   CLINICAL DATA:  Extubated.  Evaluate for atelectasis.  EXAM: PORTABLE CHEST - 1 VIEW  COMPARISON:  DG CHEST 1V PORT dated 09/08/2013  FINDINGS: Extubation and removal of nasogastric tube. Patient rotated to the right. Normal heart size. Small right pleural effusion is new. No pneumothorax. Patchy interstitial and airspace disease is relatively diffuse. Slightly greater on the right. Right hemidiaphragm elevation is moderate and chronic.  IMPRESSION: Extubation with worsened aeration. Interstitial and airspace  disease. Suspect mild pulmonary edema. Airspace disease could represent atelectasis or developing infection.  New small right pleural effusion.   Electronically Signed   By: Jeronimo Greaves M.D.   On: 09/09/2013 08:10   Dg Chest Port 1 View  09/08/2013   CLINICAL DATA:  Diabetic ketoacidosis.  Shock.  EXAM: PORTABLE CHEST - 1 VIEW  COMPARISON:  09/06/2013 and 09/04/2013  FINDINGS: Endotracheal tube and NG tube appear in good position. There is increasing density at the lung bases consistent with atelectasis. Heart size and pulmonary vascularity are normal.  IMPRESSION: Increasing slight atelectasis at the lung bases.   Electronically Signed   By: Geanie Cooley M.D.   On: 09/08/2013 07:40   Dg Chest Port 1 View  09/06/2013   CLINICAL DATA:  Endotracheal tube.  EXAM: PORTABLE CHEST - 1 VIEW  COMPARISON:  September 05, 2013.  FINDINGS: Endotracheal tube is in grossly good position with distal tip 3 cm above the carina. Nasogastric tube is seen entering the stomach. No pneumothorax or pleural effusion is noted. Mild right basilar opacity is noted consistent with subsegmental atelectasis or pneumonia. Bony thorax is intact.  IMPRESSION: Mild right basilar opacity consistent with subsegmental atelectasis or pneumonia. Endotracheal and nasogastric tubes are in grossly good position.   Electronically Signed   By: Roque Lias M.D.   On: 09/06/2013 08:45   Dg Chest Port 1 View  09/05/2013   CLINICAL DATA:  28 year old female - code blue.  EXAM: PORTABLE CHEST - 1 VIEW  COMPARISON:  09/05/2013 and prior chest radiographs dating back to 10/18/2011  FINDINGS: Upper limits normal heart size identified.  Mild interstitial pulmonary edema is present.  An endotracheal tube with tip 1.7 cm above the carina, NG tube with tip overlying the mid stomach, and defibrillator pads overlying the chest identified.  There is no evidence of large pleural effusion or pneumothorax.  IMPRESSION: Mild pulmonary edema.  Support apparatus as  described.   Electronically Signed   By: Laveda Abbe M.D.   On: 09/05/2013 15:54   Dg Chest Port 1 View  09/05/2013   CLINICAL DATA:  Endotracheal tube position.  EXAM: PORTABLE CHEST - 1 VIEW  COMPARISON:  September 04, 2013.  FINDINGS: Cardiomediastinal silhouette  appears normal. Endotracheal tube is in grossly good position with distal tip 2 cm above the carina. No acute pulmonary disease is noted. Nasogastric tube is seen entering the stomach. No pneumothorax or pleural effusion is noted.  IMPRESSION: Endotracheal tube in grossly good position. No acute cardiopulmonary abnormality seen.   Electronically Signed   By: Roque Lias M.D.   On: 09/05/2013 07:32   Portable Chest Xray In Am  09/04/2013   CLINICAL DATA:  Hypoxia  EXAM: PORTABLE CHEST - 1 VIEW  COMPARISON:  September 03, 2013  FINDINGS: Endotracheal tube tip is 2.3 cm above the carina. Nasogastric tube tip and side port are in the stomach. No pneumothorax.  Lungs are clear. Heart size and pulmonary vascularity are normal. No adenopathy.  IMPRESSION: Tube positions as described. No pneumothorax. No edema or consolidation.   Electronically Signed   By: Bretta Bang M.D.   On: 09/04/2013 07:37   Dg Chest Portable 1 View  09/03/2013   CLINICAL DATA:  Endotracheal and NG tube placement.  EXAM: PORTABLE CHEST - 1 VIEW  COMPARISON:  DG CHEST 1V PORT dated 09/03/2013  FINDINGS: Endotracheal tube tip noted 1.1 cm above the tip of the carina, mild proximal repositioning should be considered. NG tube noted with tip below left hemidiaphragm. Poor inspiration. No focal pulmonary infiltrate. No pleural effusion or pneumothorax. Heart size normal. Left posterior lateral sixth rib fracture present.  IMPRESSION: 1. Endotracheal tube with tip approximately 1.1 cm above the carina. Mild proximal reposition should be considered. 2. NG tube noted with tip below left hemidiaphragm. 3. Nondisplaced left posterior lateral sixth rib fracture. 4. No acute cardiopulmonary  disease.   Electronically Signed   By: Maisie Fus  Register   On: 09/03/2013 19:57   Dg Chest Port 1 View  09/03/2013   CLINICAL DATA:  Altered mental status  EXAM: PORTABLE CHEST - 1 VIEW  COMPARISON:  10/18/2011  FINDINGS: No cardiomegaly for technique. No infiltrate or edema suspected. No effusion or pneumothorax. There is a lateral left 6 rib fracture. There is likely associated callus, compatible with remote injury. Chronic 4 mm metallic body associated with the upper right chest.  IMPRESSION: 1.  No active disease. 2. Non acute lateral left sixth rib fracture.   Electronically Signed   By: Tiburcio Pea M.D.   On: 09/03/2013 19:29   Dg Abd Portable 1v  09/04/2013   CLINICAL DATA:  Septic shock  EXAM: PORTABLE ABDOMEN - 1 VIEW  COMPARISON:  Oct 18, 2011  FINDINGS: Nasogastric tube tip and side port are in the stomach. There is a right femoral catheter with the tip at L3-4 level, presumably in the inferior vena cava. There is a rectal thermometer.  There is moderate stool throughout colon. The bowel gas pattern is unremarkable. No obstruction or free air is seen on this supine examination. There are no abnormal calcifications.  IMPRESSION: Tube and catheter positions as described. Bowel gas pattern unremarkable.   Electronically Signed   By: Bretta Bang M.D.   On: 09/04/2013 11:22    Microbiology: Recent Results (from the past 240 hour(s))  CULTURE, BLOOD (ROUTINE X 2)     Status: None   Collection Time    09/03/13  7:00 PM      Result Value Ref Range Status   Specimen Description BLOOD FEMORAL   Final   Special Requests BOTTLES DRAWN AEROBIC AND ANAEROBIC   Final   Culture  Setup Time     Final  Value: 09/04/2013 02:11     Performed at [facility]   Culture     Final   Value: NO GROWTH 5 DAYS     Performed at [facility]   Report Status 09/10/2013 FINAL   Final  CULTURE, BLOOD (ROUTINE X 2)     Status: None   Collection Time    09/03/13  8:03 PM      Result  Value Ref Range Status   Specimen Description BLOOD RIGHT ARM   Final   Special Requests BOTTLES DRAWN AEROBIC AND ANAEROBIC 4CC   Final   Culture  Setup Time     Final   Value: 09/04/2013 02:09     Performed at [facility]   Culture     Final   Value: NO GROWTH 5 DAYS     Performed at [facility]   Report Status 09/10/2013 FINAL   Final  MRSA PCR SCREENING     Status: None   Collection Time    09/03/13 10:27 PM      Result Value Ref Range Status   MRSA by PCR NEGATIVE  NEGATIVE Final   Comment:            The GeneXpert MRSA Assay (FDA     approved for NASAL specimens     only), is one component of a     comprehensive MRSA colonization     surveillance program. It is not     intended to diagnose MRSA     infection nor to guide or     monitor treatment for     MRSA infections.  CULTURE, RESPIRATORY (NON-EXPECTORATED)     Status: None   Collection Time    09/04/13  8:11 AM      Result Value Ref Range Status   Specimen Description TRACHEAL ASPIRATE   Final   Special Requests Immunocompromised   Final   Gram Stain     Final   Value: NO WBC SEEN     NO SQUAMOUS EPITHELIAL CELLS SEEN     NO ORGANISMS SEEN     Performed at [facility]   Culture     Final   Value: FEW CANDIDA ALBICANS     Performed at [facility]   Report Status 09/06/2013 FINAL   Final  CLOSTRIDIUM DIFFICILE BY PCR     Status: None   Collection Time    09/11/13  9:35 AM      Result Value Ref Range Status   C difficile by pcr NEGATIVE  NEGATIVE Final   Comment: Performed at [facility]    Labs: Basic Metabolic Panel:  Recent Labs Lab 09/07/13 0230  09/08/13 0502  09/09/13 0500 09/09/13 1400 09/10/13 0505 09/11/13 0335 09/12/13 0525  NA 143  < > 144  < > 145 144 141 143 145  K 4.2  < > 3.5*  < > 3.4* 3.9 3.9 3.6* 3.4*  CL 106  < > 109  < > 110 112 108 109 110  CO2 19  < > 21  < > 21 22 21 22 23   GLUCOSE 190*  < > 118*  < > 90 70 109* 77 131*  BUN 48*   < > 40*  < > 32* 30* 27* 22 16  CREATININE 4.14*  < > 3.89*  < > 3.73* 3.59* 3.29* 2.93* 2.63*  CALCIUM 8.0*  < > 8.3*  < > 8.1* 8.1* 7.9* 8.3* 8.6  MG 1.4*  --  1.7  --   --   --   --   --   --   PHOS 4.1  --  4.5  --   --   --   --   --   --   < > = values in this interval not displayed.  CBC:  Recent Labs Lab 09/06/13 0220 09/06/13 1111 09/07/13 0230 09/09/13 0500 09/11/13 0335 09/12/13 0525  WBC 7.4  --  6.0 3.7* 3.2* 4.4  HGB 7.3*  --  7.6* 7.7* 8.1* 7.8*  HCT 20.0*  --  21.7* 22.6* 23.0* 23.0*  MCV 84.7  --  88.6 90.4 88.5 91.3  PLT 118* 93* 88* 87* 180 214   CBG:  Recent Labs Lab 09/11/13 2236 09/11/13 2309 09/12/13 0158 09/12/13 0527 09/12/13 0744  GLUCAP 46* 71 155* 125* 113*     Signed:  Vassie Loll  [facility] 09/12/2013, 10:58 AM

## 2014-01-16 DEATH — deceased

## 2014-10-08 NOTE — Consult Note (Signed)
PATIENT NAME:  [name] Ross, [name] Ross MR#:  [id number] DATE OF BIRTH:  [birth year]  DATE OF CONSULTATION:  05/22/2013  REFERRING PHYSICIAN:  Hope PigeonVaibhavkumar G. Elisabeth PigeonVachhani, MD CONSULTING PHYSICIAN:  Hemang K. Sherryll BurgerShah, MD  REASON FOR CONSULTATION: Altered mental status.   HISTORY OF PRESENT ILLNESS: [name] is a 29 year old African American female with a previous history of type 1 diabetes mellitus, who was brought to the hospital due to unresponsiveness on 05/17/2013 and was found to have DKA, was aggressively treated. On May 18, 2013, the patient had an episode of cardiac arrest with V. tach with CPR of 16 to 18 minutes.   The patient followed by received hypothermia protocol and improved. The patient had developed multiorgan failure.   The patient was extubated on May 20, 2013 but since then, the patient has had significant mental slowness and fluctuating respiratory status.   The patient is able to follow commands and answers 1-word questions, but she is having a hard time maintaining a full conversation.   There was concern for some psych correlate, so psychiatrist has been consulted as well.   PAST MEDICAL HISTORY: Significant for type 1 diabetes mellitus, Addison's disease, diabetic neuropathy, anxiety and depression.   PAST SURGICAL HISTORY: Negative.   FAMILY HISTORY: Significant for diabetes, otherwise unremarkable.   SOCIAL HISTORY: Significant in that she does not use alcohol or tobacco.   ALLERGIES: She does not have any known drug allergies.   MEDICATIONS: I reviewed her current medications.   REVIEW OF SYSTEMS:  Difficult to obtain, but the patient denied being in any pain. Was having hard time breathing, felt like her throat was scratchy. Otherwise, 10-system review of system was asked and was found to be negative. She also felt to have occasional shaking of her arms.   PHYSICAL EXAMINATION: VITAL SIGNS: Her temperature was 97.2, pulse 94, respiratory rate 16, blood  pressure 107/70, pulse oximetry 90.  GENERAL: The patient is a young [place] female lying in bed getting respiratory therapy.  LUNGS: Clear to auscultation.  HEART: S1, S2 heart sounds.  NECK: Carotid exam did not reveal any bruit.  ABDOMEN: She had a soft belly.   MENTAL STATUS EXAMINATION: She was alert. She knew that this is 2014, and current president is Obama. She was able to follow 1-step commands, but she had a hard time following 2-step inverted commands (such as when I asked her to point to the ceiling after pointing to the floor, she did it reverse, and when I asked her if tiger was killed by lion tell me who is dead, and she said lion).   She could not count the months of the year backward.   She had amnesia of the events that happened after she came to the hospital.   The patient is able to follow simple commands such as nodding yes and no, showing 2 fingers on the right hand, wiggle toes on the left foot.   Her attention and concentration seem to be decreased. She closes her eyes intermittently.   I did not see any neurological neglect. The patient denied any active hallucinations or delusions.   She is quite hypophonic.   On her cranial nerves, her pupils are equal, round, and reactive. Extraocular movements are intact but slow. Her visual fields seem to be full. Her face was symmetric. Tongue was midline. Facial sensations were intact. Her hearing seems to be intact.   On her motor exam, she has generalized weakness. She had positive asterixis and positive myoclonic  jerks.   She also had generalized mild tenderness.   Her reflexes were symmetric and diminished. Her sensations were decreased to light touch in her feet. She was not able to stand up or sit up in her bed.   Her coordination was impaired. She seemed to have generalized weakness around 4+/5.   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 In a  person who has a previous history for diabetes type 1 with admission for diabetic ketoacidosis, likely metabolic encephalopathy.   I had talked to Dr. Elisabeth Pigeon in person about hypoxic ischemic injury during code affecting hippocampus the most, which is a metabolically highly active area, so patients typically have significant issue with registration, and that is why it looks like they have a hard time with higher cortical function.   The patient seems to have improved even since her extubation.   I have low suspicion that she has any other organic process going on simultaneously. She also had a multiorgan failure, which can impair her higher cortical function.   We should obtain MRI of the brain.   I will not order EEG, which I think would be a low yield.   I think we should add thiamin 100 mg IV, as thiamine deficiency is known to occur in patients with poor p.o. intake as well as brittle diabetics and malnutrition in general.   The patient should also get other multivitamins.   ____________________________ Durene Cal. Sherryll Burger, MD hks:jcm D: 05/22/2013 18:18:30 ET T: 05/22/2013 18:56:47 ET JOB#: [id number]  cc: Hemang K. Sherryll Burger, MD, <Dictator> Durene Cal [facility] MD ELECTRONICALLY SIGNED 05/29/2013 13:26

## 2014-10-08 NOTE — Consult Note (Signed)
PATIENT NAME:  [name] Ross, HOEFLING MR#:  [id number] DATE OF BIRTH:  July 29, 1985  DATE OF CONSULTATION:  05/22/2013  CONSULTING PHYSICIAN:  Audery Amel, MD  IDENTIFYING INFORMATION AND REASON FOR CONSULTATION: A 29 year old woman with type 1 diabetes, currently in the CCU recovering from DKA. Consultation for behavior change.   HISTORY OF PRESENT ILLNESS: Information obtained from the chart, from the nursing staff and from the patient. The patient apparently came into the hospital with blood sugars over 1000, in DKA. Has been in the CCU now. She was intubated for a while. She even had a cardiac arrest. Nursing staff reports that prior to this, when they were previously able to speak to her, she was able to talk and communicate normally. Since being extubated today, her mental status has changed. It seems like it waxes and wanes. Nursing reports that they have had a lot of trouble getting her to speak at all or communicate with them. Meanwhile, she was able to talk a little bit with the doctor. Earlier today, it sounds like she pulled out her arterial line. They are concerned about her behavior pattern. On interview, the patient is able to provide almost no history. She is awake and makes a little bit of eye contact with me, makes some attempts to answer, but she cannot even articulate a full word clearly. Not really able to say much of anything. She is able to make herself known yes or no and what her name is and to indicate that she is in a hospital, but not much more than that.   PAST PSYCHIATRIC HISTORY: There have been episodes in previous years of concern about her depression, and it looks like she had 1 previous psychiatric hospitalization for depression; however, it does not look like it was ever confirmed that she had tried to kill herself. A lot of the behavior problems in the depression are closely linked to her chronic severe medical problems. She had been on Zoloft in the past, with possibly  a little bit of benefit from it. No other psychiatric diagnosis. I think Dr. 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 I am not sure whether she is currently following up with a mental health provider outside the hospital.   MEDICAL HISTORY: Severe chronic medical problems dominated by her type 1 diabetes, which has been difficult to control and has resulted in DKA on several occasions. This time, she not only had DKA, but she had a cardiac arrest, from what I understand, was intubated. May have a pneumonia. It looks like she may have C. difficile at this point as well. Pretty severe medical problems.   SOCIAL HISTORY: The patient is not able to share much detail about that with me right now, so I do not have direct information from her.   SUBSTANCE ABUSE HISTORY: I do not see where substance abuse has been a major concern, and it is not a concern right now at this moment.   CURRENT MEDICATIONS: A large number of medications needed right now. Amiodarone 400 mg a day, metronidazole 500 mg IV q.8 hours, Zantac 150 mg oral daily, vancomycin 125 q.6 hours oral daily, aspirin daily, insulin coverage as needed, albuterol inhaler, Pulmicort inhaler, calcium gluconate. I am looking to see if she is still on an antidepressant, and I am not finding anything right now.   ALLERGIES: No known drug allergies.   MENTAL STATUS EXAMINATION: Very limited. The patient is in  the CCU. She has nasal oxygen being given at high flow. She is awake, makes some effort to cooperate. Eye contact intermittent. Psychomotor activity very lethargic. Speech is minimal in amount and slurred, barely understandable. She is able to shake her head yes or no, but even then, she is pretty lethargic about it. Affect flat. Thoughts hard to gauge, presumably very limited right now. She adamantly does shake her head no, that she does not want to die or want to hurt herself. Currently, does not seem to be  acting as though she is responding to internal stimuli.   ASSESSMENT: The patient appears to be delirious. Has waxing and waning mental state with limited and impaired cognition. Delirium is primarily a medical condition. She has multiple medical reasons to be delirious, not only her blood sugar, but her oxygen, her recent cardiac arrest, the fatigue and stress of the whole thing, being in the CCU. I am not surprised that she is still delirious. I have no way of really evaluating if she is depressed given all the other problems that she is having.   TREATMENT PLAN: I have put in an order for p.r.n. Haldol 1 mg that can be used if she gets agitated, especially at night, if there is concern about agitation as a danger to herself. Since it looks like she is able to take pills, we can go ahead and restart her back on Zoloft at least at 50 mg a day right now, and I will take care of that. No other specific treatment right now.   DIAGNOSIS, PRINCIPAL AND PRIMARY:  AXIS I: Delirium due to multiple medical problems.   SECONDARY DIAGNOSIS:  AXIS I: Major depression, recurrent, in partial remission.  AXIS II: Deferred.  AXIS III: Multiple medical problems as detailed above and elsewhere.  AXIS IV: Severe chronic stress from life-threatening illnesses and disability.  AXIS V: Functioning at time of evaluation 30.    ____________________________ Audery AmelJohn T. Shaiann Mcmanamon, MD jtc:lb D: 05/22/2013 13:41:28 ET T: 05/22/2013 14:03:04 ET JOB#: [id number]  cc: Audery AmelJohn T. Amaree Leeper, MD, <Dictator> Audery AmelJOHN T Ell Tiso MD ELECTRONICALLY SIGNED 05/22/2013 18:45

## 2014-10-08 NOTE — Consult Note (Signed)
PATIENT NAME:  [name] Ross, SPADER MR#:  [id number] DATE OF BIRTH:  1985-11-09  DATE OF CONSULTATION:  05/17/2013  CONSULTING PHYSICIAN:  A. Lavone Orn, MD  REFERRING PHYSICIAN: Fulton Reek, M.D.   CHIEF COMPLAINT: Diabetic ketoacidosis.   HISTORY OF PRESENT ILLNESS: This is a 29 year old female with type 1 diabetes complicated by diabetic nephropathy, peripheral neuropathy, and autonomic neuropathy with gastroparesis as well as hypothyroidism, who was brought in by family today after being found unresponsive. She has been staying with her mother over the Thanksgiving holiday. Mother states that she had been doing fairly well, although had a poor appetite for the last 2 days and had been complaining of a sore throat. Mother found her unresponsive around 8:00 a.m. No evidence of vomiting. When brought to the ED, her initial blood sugar was 1991, creatinine was elevated at 6.65, bicarb was only 5, EGFR 8, anion gap elevated at 34, with an arterial pH of less than 6.9, and urine showing elevated ketones and serum beta hydroxybutyrate also elevated, consistent with diabetic ketoacidosis. She has been started on IV fluids and IV insulin, in addition to pressor support for hypotension. The patient remains unresponsive. I was not  able to obtain a history. Mother was interviewed at the bedside. The patient is known to me from prior hospitalizations regarding a documented history of Addison's disease, I have questioned this diagnosis in the past. I have obtained morning cortisol levels after weaning her off steroids, and found them to be normal. I do see that a repeat serum cortisol was ordered early today, and that  result is currently pending. During the last 3 days that she has been staying with her mother, mother has not witnessed her check blood sugar or dispense insulin.   MEDICAL HISTORY: 1.  Type 1 diabetes.  2.  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 Follows with Dr.  Juleen China.  3.  Diabetic peripheral neuropathy.  4.  Diabetic autonomic neuropathy with gastroparesis.  5.  History of hypothyroidism.   OUTPATIENT MEDICATIONS: 1.  Lantus insulin 10 units in the morning and 8 units at night.  2.  NovoLog insulin at meals, dose not known.   ALLERGIES: No known drug allergies.   SOCIAL HISTORY: The patient typically lives alone, has been staying with her mother for the last few days. No known tobacco abuse.   FAMILY HISTORY: One grandfather with type 2 diabetes.   REVIEW OF SYSTEMS: Unable to obtain.   PHYSICAL EXAMINATION: VITAL SIGNS: Height 65 inches, weight 110 pounds, BMI 18, temperature 92.7, pulse 90, respirations 35, blood pressure 101/57.  GENERAL: A critically ill-appearing African-American female, agitated and distressed and nonresponsive.  HEENT: Difficult to assess. She is not able to cooperate at this time.  NECK: No appreciable thyromegaly.  CARDIAC: Tachycardic, without murmur.  PULMONARY: Clear to auscultation bilaterally. No wheeze.  ABDOMEN: Diffusely soft.  GENITOURINARY: Foley catheter in place.  EXTREMITIES: No edema is present.  NEUROLOGIC: Unresponsive, not able to follow commands, agitated.   LABORATORY: Glucose greater than 1600, BUN 93, creatinine 5.99, EGFR 9. Additional labs from 10:00 a.m., glucose 1991, BUN 107, creatinine 6.65, sodium 123, potassium 7.1, magnesium 3.1. Albumin 3.2, AST 38, ALT 29, alkaline phosphatase 175. WBC 9.6, hematocrit 36.4, platelets 398. Urinalysis with glucose greater than 500, 1+ urinary ketones, otherwise negative. Arterial blood gas with pH less than 6.9, bicarb less than 19.   RADIOLOGY: Portable chest x-ray notable for mild patchy right upper lobe opacity, suspicious for pneumonia.  ASSESSMENT: 1.  Diabetic ketoacidosis.  2.  Uncontrolled type 1 diabetes.  3.  Diabetic peripheral neuropathy.  4.  Diabetic nephropathy.  5.  Diabetic autonomic neuropathy with gastroparesis.  6.   Hyperkalemia.  7.  Reported history of Addison's disease, likely not true adrenal insufficiency.  8.  History of hypothyroidism.   PLAN: 1.  Agree with supportive IV fluids and IV insulin, as you are doing.  2.  Continue to follow electrolytes and met B closely. May need Kayexalate for hyperkalemia or consideration of potassium replacement, as her potassium level may fall precipitously.  3.  Will assist with conversion to subcu insulin, once appropriate.  4. Agree with Nephrology consultation, this will be helpful and dialysis may need to be considered.  5.  Agree with antibiotics for pneumonia, as had been ordered.  6.  Will hold her diet for now. Prefer not to give her a diet until DKA has resolved.  7.  Will follow up on the pending cortisol ordered from this morning, if greater than 20 then she certainly does not need steroids on a routine basis.  8.  Will gather further history about hypothyroidism once she is more responsive. If she has not been taking any thyroid hormone replacement yet her TSH is normal, then certainly no thyroid hormone will be needed.  9.  Discussed with mom her critical illness and severity of her disease. I am very concerned about potential further worsening of her condition.     ____________________________ A. Lavone Orn, MD ams:mr D: 05/17/2013 13:26:08 ET T: 05/17/2013 16:01:25 ET JOB#: [id number]  cc: A. Lavone Orn, MD, <Dictator> Sherlon Handing MD ELECTRONICALLY SIGNED 05/25/2013 13:10

## 2014-10-08 NOTE — Consult Note (Signed)
Chief Complaint:  Subjective/Chief Complaint C. Diff colitis   VITAL SIGNS/ANCILLARY NOTES: **Vital Signs.:   06-Dec-14 06:00  Vital Signs Type Routine  Pulse Pulse 94  Pulse source if not from Vital Sign Device per cardiac monitor  Respirations Respirations 19  Systolic BP Systolic BP 95  Diastolic BP (mmHg) Diastolic BP (mmHg) 58  Mean BP 70  Pulse Ox % Pulse Ox % 98  Pulse Ox Activity Level  At rest  Oxygen Delivery HFNC 44%  Pulse Ox Heart Rate 94  Heart Rate 94   Brief Assessment:  GEN critically ill appearing   Respiratory no use of accessory muscles   Assessment/Plan:  Assessment/Plan:  Assessment C. Diff.   Plan Patient without any further diarrhea. No change since yesterday. Continue current treatment.   Electronic Signatures: Midge MiniumWohl, Asianae Minkler (MD)  (Signed 06-Dec-14 08:12)  Authored: Chief Complaint, VITAL SIGNS/ANCILLARY NOTES, Brief Assessment, Assessment/Plan   Last Updated: 06-Dec-14 08:12 by Midge MiniumWohl, Shamarra Warda (MD)

## 2014-10-08 NOTE — Consult Note (Signed)
Brief Consult Note: Diagnosis: delirium due to medical illness.   Patient was seen by consultant.   Consult note dictated.   Orders entered.   Comments: Psychiatry: PAtient seen. PAtient is delirious due to multiple severe medical conditions. Order entered for prn haldol iv for agitation.  Electronic Signatures: Muscab Brenneman, Jackquline DenmarkJohn T (MD)  (Signed 05-Dec-14 12:14)  Authored: Brief Consult Note   Last Updated: 05-Dec-14 12:14 by Audery Amellapacs, Nhyla Nappi T (MD)

## 2014-10-08 NOTE — Op Note (Signed)
PATIENT NAME:  [name] MinorsHENDERSON, Marelyn MR#:  [id number] DATE OF BIRTH:  [birth year]  DATE OF PROCEDURE:  08/15/2012  PREOPERATIVE DIAGNOSES: 1.  Gastroenteritis.  2.  Acute renal failure.  3.  Diabetes mellitus.   POSTOPERATIVE DIAGNOSES: 1.  Gastroenteritis.  2.  Acute renal failure.  3.  Diabetes mellitus.   PROCEDURES:  1. Ultrasound guidance for vascular access to right basilic vein.  2. Fluoroscopic guidance for placement of catheter.  3. Insertion of 5-French double-lumen peripherally inserted central venous catheter line, right arm.  SURGEON: Levora DredgeGregory Schnier, MD  ANESTHESIA: Local.   ESTIMATED BLOOD LOSS: Minimal.   INDICATION FOR PROCEDURE:  1.  Lack of appropriate IV access. 2.  Requiring parenteral medications greater than 5 days.  DESCRIPTION OF PROCEDURE: The patient's right arm was sterilely prepped and draped, and a sterile surgical field was created. The right basilic vein was accessed under direct ultrasound guidance without difficulty with a micropuncture needle and permanent image was recorded. 0.018 wire was then placed into the superior vena cava. Peel-away sheath was placed over the wire. A single lumen peripherally inserted central venous catheter was then placed over the wire and the wire and peel-away sheath were removed. The catheter tip was placed into the superior vena cava and was secured at the skin at 33 cm with a sterile dressing. The catheter withdrew blood well and flushed easily with heparinized saline. The patient tolerated procedure well.  ____________________________ Renford DillsGregory G. Schnier, MD ggs:aw D: 08/22/2012 08:32:59 ET T: 08/22/2012 09:03:46 ET JOB#: [id number]  cc: Renford DillsGregory G. Schnier, MD, <Dictator> Renford DillsGREGORY G SCHNIER MD ELECTRONICALLY SIGNED 09/16/2012 17:18

## 2014-10-08 NOTE — Consult Note (Signed)
Chief Complaint:  Subjective/Chief Complaint no signs distress   VITAL SIGNS/ANCILLARY NOTES: **Vital Signs.:   05-Dec-14 10:00  Vital Signs Type Routine  Temperature Temperature (F) 97.2  Celsius 36.2  Temperature Source axillary  Pulse Pulse 92  Pulse source if not from Vital Sign Device per cardiac monitor  Systolic BP Systolic BP 93  Diastolic BP (mmHg) Diastolic BP (mmHg) 59  Mean BP 70  Pulse Ox % Pulse Ox % 94  Oxygen Delivery Venturi Mask; 45%  Pulse Ox Heart Rate 92  Heart Rate 94   Brief Assessment:  Cardiac Regular   Additional Physical Exam confused, no gross focal weakness,  no rash, no bleeding   Lab Results: Hepatic:  05-Dec-14 04:03   Bilirubin, Total  1.3  Alkaline Phosphatase  344 (45-117 NOTE: New Reference Range 05/08/13)  SGPT (ALT)  530  SGOT (AST)  941  Total Protein, Serum  5.2  Albumin, Serum  2.5  Routine Micro:  05-Dec-14 01:46   Micro Text Report CLOSTRIDIUM DIFFICILE   C.DIFFICILE ANTIGEN       C.DIFFICILE GDH ANTIGEN : POSITIVE   C.DIFFICILE TOXIN A/B     C.DIFFICILE TOXINS A AND B : NEGATIVE   PCR FOR TOXIGENIC C.DIFF  PCR FOR TOXIGENIC C.DIFFICILE : POSITIVE   INTERPRETATION Positive for toxigenic C. difficile, active toxin production NOT detected.  Patient has toxigenic C. difficile organisms present in the bowel, but toxin was not detected.  The patient may be a carrier or the level of toxin in the sample was below the limit of detection. This information should be used in conjunction with the patient's clinical history when deciding on possible therapy.   ANTIBIOTIC                       Routine Chem:  05-Dec-14 01:46   Result Comment CDIFF - NOTIFIED OF CRITICAL VALUE  - C/ MEGAN TOBEY '@[id number]'  05-22-13. AJO  - READ-BACK PROCESS PERFORMED.  Result(s) reported on 22 May 2013 at 04:04AM.    04:03   Glucose, Serum  57  BUN  68  Creatinine (comp)  6.08  Sodium, Serum 137  Potassium, Serum 3.6  Chloride, Serum 100  CO2, Serum 25   Calcium (Total), Serum  6.4  Osmolality (calc) 291  eGFR (African American)  10  eGFR (Non-African American)  9 (eGFR values <81m/min/1.73 m2 may be an indication of chronic kidney disease (CKD). Calculated eGFR is useful in patients with stable renal function. The eGFR calculation will not be reliable in acutely ill patients when serum creatinine is changing rapidly. It is not useful in  patients on dialysis. The eGFR calculation may not be applicable to patients at the low and high extremes of body sizes, pregnant women, and vegetarians.)  Result Comment labs - This specimen was collected through an   - indwelling catheter or arterial line.  - A minimum of 576m of blood was wasted prior    - to collecting the sample.  Interpret  - results with caution. CALCIUM - NOTIFIED OF CRITICAL VALUE  - RESULTS VERIFIED BY REPEAT TESTING.  - CALLED TO MEGAN TOBEY AT [id number] 05/22/13  - READ-BACK PROCESS PERFORMED.  - TPL  Result(s) reported on 22 May 2013 at 04:29AM.  Result Comment PLATELET - RESULTS VERIFIED BY REPEAT TESTING.  - CRITICAL VALUE PREVIOUSLY NOTIFIED.  - VERIFIED BY SMEAR ESTIMATE  Result(s) reported on 22 May 2013 at 04:46AM.  Anion Gap 12  Routine Hem:  05-Dec-14 04:03   WBC (CBC) 5.0  RBC (CBC)  2.92  Hemoglobin (CBC)  8.7  Hematocrit (CBC)  24.9  Platelet Count (CBC)  22  MCV 86  MCH 30.0  MCHC 35.1  RDW  17.1  Neutrophil % 65.6  Lymphocyte % 27.9  Monocyte % 4.6  Eosinophil % 1.4  Basophil % 0.5  Neutrophil # 3.3  Lymphocyte # 1.4  Monocyte # 0.2  Eosinophil # 0.1  Basophil # 0.0   Assessment/Plan:  Assessment/Plan:  Assessment MULTIPLE PROBLEMS SEE ALSO OTHER PROVIDERS NOTES, ACUTE ON CHRONIC RENAL FAILURE, CRE WORSE TODAY, SHOCK LIVER, SEPTIC SHOCK, HYPOXIC.Marland KitchenMarland KitchenFROME HEME POINT OF VIEW, HGB UP AFTER TRANSFUSION, PLTS HOLDING, LIKELY ETIOLOGY SHOCK, INFECTION, POSSIBLE CONTRIBUTION OF DRUG EFFECT, AMIODORONE, ZOSYN, VERY UNLIKELY WITH TIME COURSE,  POSSIBLE HEPARIN EFFECT. DIC. NO EVIDENCE GROSS BLEED   Plan AS DISCUSED WITH MEDICINE, WATCH FOR BLEED, CHECK FOR HEMOLYSIS, NO PRIIOR EVIDENCE, BILI WAS NORMAL UNTIL TODAY,  SMEAR HAS BEEN REVIEWED, HEPARIN ANTIBODIES PENDING, CHECK RETIC, COOMBS, IRON STUDIES. IF BLEEDING OR PLTS LESS THAN 20 K WOULD TRANSFUSE PLTS   Electronic Signatures: Dallas Schimke (MD)  (Signed 05-Dec-14 10:07)  Authored: Chief Complaint, VITAL SIGNS/ANCILLARY NOTES, Brief Assessment, Lab Results, Assessment/Plan   Last Updated: 05-Dec-14 10:07 by Dallas Schimke (MD)

## 2014-10-08 NOTE — Consult Note (Signed)
Brief Consult Note: Diagnosis: C. Diff positive with diarrhea started yesterday.   Patient was seen by consultant.   Discussed with Attending MD.   Comments: The patient had a positive c. diff and started on Flagyl and Vanco. She has had no further diarrhea today. Patient with multi organ failure with shock liver after cardio-pulm arrest. Will continue to follow with you.  Electronic Signatures: Midge MiniumWohl, Lauri Till (MD)  (Signed 05-Dec-14 15:42)  Authored: Brief Consult Note   Last Updated: 05-Dec-14 15:42 by Midge MiniumWohl, Takuma Cifelli (MD)

## 2014-10-08 NOTE — H&P (Signed)
PATIENT NAME:  [name] Ross, [name] Ross MR#:  [id number] DATE OF BIRTH:  [birth year]  DATE OF ADMISSION:  08/14/2012  PRIMARY CARE PHYSICIAN: [facility]  HISTORY OF PRESENT ILLNESS:  The patient is a 29 year old female with past medical history significant for history of diabetes mellitus type 1, history of recent admission in May 2013 for UTI, acute renal failure, as well as septic shock who presents back to the hospital with complaints of nausea, vomiting, as well as episodes of diarrhea which lasted a few days. Her nausea and vomiting started approximately 5 days ago. Since 5 days ago, she is not able to eat well. Her p.o. intake has decreased. She is not urinating. She also feels very poorly. She feels dehydrated. She presented to the hospital where she was noted to be tachycardic with heart rate in the 120s and very weak. She is also complaining of some abdominal pains all over her abdomen as well as chest pains. Hospitalist service was contacted for admission as the patient was noted to be in acute on chronic renal failure and severely hyperglycemic, however not acidotic.   PAST MEDICAL HISTORY: Significant for history of diabetes mellitus type 1. The patient had hemoglobin A1c of 11.5 in May 2013, history of septic shock in May 2013. At that time, she had urinary tract infection, acute on chronic renal failure, DKA, also episodes of hypoglycemia, history of anemia, hypertension, as well as apparently adrenal insufficiency. She reportedly had brief adrenal insufficiency in 2009, already assessed, and was  found to have normal cortisone levels. Not on cortisol.  Also significant for history of depression, peripheral neuropathy secondary to diabetes and diabetic gastroparesis.   MEDICATIONS: The patient is on Lantus 30 units in the morning and 8 units in p.m., sliding scale insulin and Synthroid 75 mcg p.o. daily. She is not on any blood pressure medications at the moment.  PAST SURGICAL HISTORY:  None.  ALLERGIES:  No known drug allergies.   FAMILY HISTORY: Positive for hypertension.   SOCIAL HISTORY: The patient is enrolled at [employer]. No smoking or alcohol abuse. Does not work. Lives with her boyfriend.   REVIEW OF SYSTEMS:  The patient admits of fatigue and weakness, some weight loss but she is unsure how much, feels that her sinuses are somewhat congested. She has been having hiccups as well as chest pains intermittently in upper chest area. Also nausea and vomiting intermittently. Not able to eat for the past 5 days. Had diarrhea episodes over the weekend which has stopped at this time. Also her vomitus was very dark and looked quite bloody.  CONSTITUTIONAL: Denies any high fevers, pains, except as mentioned above. No weight gain.  EYES:  Denies any blurry vision, double vision, glaucoma or cataracts.   ENT:  Denies any tinnitus, allergies, epistaxis, sinus pain, dentures or difficulty swallowing.  RESPIRATORY: Denies any cough, wheezes, asthma or COPD.  CARDIOVASCULAR: Denies orthopnea, edema, arrhythmias, palpitations or syncope.  GASTROENTEROLOGY: Denies significant abdominal pains, however, admits of having some discomfort in the abdomen, especially with nausea and vomiting. No rectal bleeding, change in bowel habits.  GENITOURINARY: Denies dysuria, hematuria, frequency or incontinence. Admits of having decreased urinary frequency. ENDOCRINOLOGY: Denies any polydipsia, nocturia. Admits of having thyroid problems, however, says she is not taking her Synthroid as she is supposed to.  SKIN: Denies any acne, rashes, lesions or change in moles.  MUSCULOSKELETAL: Denies arthritis, cramps or swelling. NEUROLOGIC:  Denies numbness, epilepsy or tremor.  PSYCHIATRY: Denies anxiety, insomnia or depression.  PHYSICAL EXAMINATION: VITAL SIGNS: On arrival to the hospital, temperature is 97.4, pulse 91, respiratory rat was 20, blood pressure 111/92 and saturation was 95%  on room air.  GENERAL: This is a well-developed, well-nourished female in no significant distress, somewhat weak and sick looking, lying on the stretcher.  HEENT: Her pupils are equal and reactive to light. Extraocular movements intact. No icterus or conjunctivitis. Has normal hearing. No pharyngeal erythema. Mucosa is dry. NECK:  No masses, supple and nontender. Thyroid is not enlarged. No adenopathy, JVD or carotid bruits bilaterally. Full range of motion.  LUNGS: Clear to auscultation in all fields. No rales, rhonchi or diminished breath sounds or wheezing. No labored inspirations, increased effort, dullness to percussion or overt respiratory distress. CARDIOVASCULAR: S1 and S2 appreciated. No murmurs, gallops or rubs. Rhythm was regular.  Chest is nontender to palpation. 1+ pedal pulses. No lower extremity edema, calf tenderness or cyanosis noted.  ABDOMEN: Soft and nontender. No hepatosplenomegaly or masses were noted. Rectal was deferred. Bowel sounds are present although diminished.  MUSCLE STRENGTH: Able to move all extremities. No cyanosis, degenerative joint disease or kyphosis. Gait was not tested.  SKIN: No rashes, lesions, erythema, nodularity or induration. It was warm and dry to palpation. No adenopathy in cervical region.  NEUROLOGIC: Cranial nerves grossly intact. Sensory is intact. No dysarthria or aphasia. The patient is alert and oriented to time, person and place. Cooperative. Memory is good. No significant confusion, agitation or depression noted.  LABS AND DIAGNOSTICS: BMP showed glucose of 424. BUN and creatinine were elevated at 42 and 2.72 as opposed to 34 and 1.93, at the beginning of February 2014. Otherwise BMP was unremarkable. Bicarbonate level was normal at 25. The patient's liver enzymes revealed elevated total protein of 9.0, otherwise unremarkable. White blood cell count elevated to 11.3, hemoglobin was 13.9 and platelet count 326. Absolute neutrophil count is not  checked.   ABGs were done on venous blood and showed pH of 7.39 and pCO2 was 42.  EKG: Sinus tachycardia at rate of 114 beats per minutes, normal axis. No acute ST-T changes were noted. Nonspecific T wave abnormalities were noted only.  RADIOLOGIC STUDIES:  None done.  ASSESSMENT AND PLAN: 1.  Acute gastroenteritis. Seems to be subsiding in regards to diarrhea, but if the patient's diarrhea recurs, we will get stool cultures for GI pathogens as well as Clostridium difficile.  We will also start the patient on clear liquid diet and will continue proton pump inhibitors IV. We will rehydrate her. 2.  Dehydration. As above, we will continue IV fluids.  3.  Acute on chronic renal failure. We will get urinalysis as soon as it is available. We will continue IV fluids and follow the patient's creatinine levels.  4.  Diabetes mellitus type 1. We will continue Lantus as well as sliding scale insulin.     5.  Hypothyroidism. Continue Synthroid. Check TSH.   TIME SPENT: 50 minutes.  ___________________________ Katharina Caper, MD rv:sb D: 08/14/2012 14:13:10 ET T: 08/14/2012 14:36:24 ET JOB#: [id number]  cc: Katharina Caper, MD, <Dictator> [facility], [facility] Ikram Riebe MD ELECTRONICALLY SIGNED 09/09/2012 15:12

## 2014-10-08 NOTE — H&P (Signed)
PATIENT NAME:  [name] Ross, PAVLICEK MR#:  [id number] DATE OF BIRTH:  19-Dec-1985  DATE OF ADMISSION:  05/17/2013  DATE OF ADMISSION: 05/17/2013.   REFERRING PHYSICIAN: Dr. York Cerise.   FAMILY PHYSICIAN: Dr. Beverely Risen.   REASON FOR ADMISSION: Unresponsiveness.   HISTORY OF PRESENT ILLNESS: The patient is a 29 year old female with a history of type 1 diabetes as well as Addison's disease who presents to the Emergency Room unresponsive. She was apparently well yesterday according to her mother. She was found this morning unresponsive with vomitus around her mouth with Kussmaul respirations.  She was brought to the Emergency Room where she was found to be only minimally responsive to painful stimuli. She was hypotensive and tachycardic. She was found to be in acute renal failure with hyper kalemia as well as DKA. Her systolic blood pressure in the Emergency Room was 50. She is now admitted for further evaluation. The patient is unable to give history or respond to questions at this time.   PAST MEDICAL HISTORY: 1. Type 1 diabetes mellitus.  2. Addison's disease.  3. Diabetic neuropathy.  4. Anxiety/depression.   MEDICATIONS: 1. Zofran 4 mg p.o. q.8 h. p.r.n. nausea and vomiting.  2. NovoLog 100 units subcutaneous t.i.d.  3. Levemir 10 units subcutaneous q.a.m. and 8 units subcutaneous every p.m.   ALLERGIES: No known drug allergies.   SOCIAL HISTORY: Negative for alcohol or tobacco abuse.   FAMILY HISTORY: Positive for diabetes, but otherwise unremarkable.   REVIEW OF SYSTEMS: Unable to obtain.   PHYSICAL EXAMINATION: GENERAL: The patient is critically ill appearing, in severe distress.  VITAL SIGNS: Currently remarkable for a blood pressure of 61/30, with a heart rate of 31 and a respiratory rate of 16 and a temperature of 90.3.  HEENT: Normocephalic, atraumatic. Pupils are are minimally reactive and symmetric.  OROPHARYNX: Dry but clear.  NECK: Supple without JVD. No adenopathy.   LUNGS: Scattered rhonchi.  CARDIAC: Rapid rate with regular  rhythm. Normal S1 and S2. There is a 2/6 systolic murmur noted throughout the precordium. No rubs or gallops are present.  ABDOMEN: Soft, nontender, with marked bowel sounds. No organomegaly or masses were appreciated. No hernias or bruits were noted.  EXTREMITIES: Without clubbing, cyanosis or edema. Pulses were 1+ bilaterally.  SKIN: Cool but dry without rash or lesions.  NEUROLOGIC: Revealed a patient who was unresponsive but did respond to painful stimuli. She did move all extremities to pain.  PSYCHIATRIC: Unable to be performed.   LABORATORY DATA: Chest x-ray revealed right upper lobe infiltrate consistent with pneumonia. EKG revealed sinus tachycardia with no acute ischemic changes. ABG revealed a pH of less than 6.9 with a pCO2 of 19 and a pO2 of 134 on room air. White count was 9.6 with a hemoglobin of 9.3 and a platelet count of 398,000. BUN was 107 with a creatinine of 6.65 and a magnesium 3.1 with a potassium of 7.1.   ASSESSMENT: 1. Diabetic ketoacidosis.  2. Metabolic encephalopathy.  3. Hypotension with hemodynamic collapse.  4. Acute renal failure.  5. Hyperkalemia.  6. Anemia of chronic disease.  7. History of Addison's disease.  8. Presumed aspiration pneumonia.   PLAN: The patient will be admitted to the Intensive Care Unit started on Levophed with her IV fluids and an insulin drip. Will be given Zosyn. We will send blood and urine cultures. We will  obtain consults from nephrology and endocrinology. We will follow her sugars hourly. Repeat chemistries in 2 hours. Neuro checks q.4h. Follow  up chest x-ray and routine labs in the morning. Nothing by mouth for now. Further treatment and evaluation will depend upon the patient's progress.   TOTAL TIME SPENT ON THIS PATIENT: 50 minutes.    ____________________________ Duane LopeJeffrey D. Judithann SheenSparks, MD jds:sg D: 05/17/2013 11:20:14 ET T: 05/17/2013 12:32:27  ET JOB#: [id number]  cc: Duane LopeJeffrey D. Judithann SheenSparks, MD, <Dictator> Lyndon CodeFozia M. Khan, MD  Ammy Lienhard Rodena Medin Ules Marsala MD ELECTRONICALLY SIGNED 05/17/2013 15:20

## 2014-10-08 NOTE — Discharge Summary (Signed)
PATIENT NAME:  [name] Ross, [name] MR#:  [id number] DATE OF BIRTH:  1985/10/10  DATE OF ADMISSION:  05/17/2013 DATE OF TRANSFER:  05/26/2013  DISPOSITION:  Transferred to long-term acute care facility, [facility] TRANSFER DIAGNOSES: 1.  Ventricular tachycardia with cardiac arrest status post cardiopulmonary resuscitation.  2.  Toxic metabolic encephalopathy with possible mild anoxic brain injury.  3.  Clostridium difficile infection.  4.  Aspiration pneumonia.  5.  Bilateral pleural effusion secondary to fluid overload.  6.  Elevated troponin from ventricular tachycardia and cardiopulmonary resuscitation.  7.  Diabetic ketoacidosis with insulin-dependent diabetes mellitus.  8.  Hypoglycemia.  9.  Septic shock.  10.  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  11.  Anemia of chronic disease.  12.  Thrombocytopenia.  13.  Hypocalcemia.  14.  Questionable history of Addison's disease.  15.  Hyperkalemia.  16.  Depression.  17.  Malnutrition.   CONSULTANTS:  1.  Dr. Welton FlakesKhan with pulmonology.  2.  Dr. Adrian BlackwaterShaukat Khan with cardiology.  3.  Dr. Toni Amendlapacs with psychiatry.  4.  Dr. Thedore MinsSingh with nephrology.  5.  Dr. Tedd SiasSolum with endocrinology.  6.  Dr. Lorre NickGittin with hematology.  7.  Dr. Servando SnareWohl with GI.   IMAGING STUDIES: Include a chest x-ray which showed right upper lobe opacity. Repeat chest x-ray on 05/24/2013 showed bilateral pleural effusions.  CT scan of the head without contrast showed sphenoid sinusitis. No acute abnormalities.   Echocardiogram showed ejection fraction of 45% to 50% with mild septal hypokinesis and trivial pericardial effusion.   ADMITTING HISTORY AND PHYSICAL: Please see detailed H and P dictated by Dr. Judithann SheenSparks on 05/17/2013. In brief, [name] patient with history of insulin-dependent diabetes mellitus and questionable Addison's disease, presented to the hospital because of unresponsiveness. The patient was found to be in DKA,  aspiration pneumonia and septic shock, was admitted to CCU.   HOSPITAL COURSE: 1.  DKA.  The patient's DKA had resolved well being on insulin drip along with IV fluid resuscitation. Presently, she is on 6 units of Lantus and dietary NovoLog.  2.  VT cardiac arrest. The patient had V. tach, two episodes on 05/18/2013 with cardiopulmonary resuscitation totaling of about 18 minutes. This was secondary to her acid-base disturbance. The patient was started on amiodarone. The patient has been seen by Dr. Welton FlakesKhan with cardiology. Echo showed EF of 45% to 50% with some septal hypokinesis. Secondary to the patient's creatinine being elevated, a cardiac catheter has not been done, but the patient will need cardiac catheter prior to discharge from the LTAC.  Presently, the patient is chest pain-free. The elevated troponin could be secondary to ventricular tachycardia and cardiopulmonary resuscitation.  3.  Acute renal failure over CKD, stage IV. The patient's baseline creatinine is around 2. Her creatinine has worsened as high as 6.1, presently is down to 5 improving with good urine output. Followed by nephrology. No dialysis during the hospital stay. This was secondary to ATN,  hypotension and septic shock.  4.  Aspiration pneumonia with septic shock along with C-diff infection. The patient is on meropenem, which will be stopped. Continue vancomycin and Flagyl orally. Presently, no diarrhea. Off pressors, blood pressure is 112/60 at this time with heart rate of 84 and regular.  5.  Thrombocytopenia. This was secondary to sepsis, improving and last platelet count is improved to 123 from as low as 22.  6.  Depression.  The patient has been seen by psychiatry. The patient seems to  be improving. She does have some mild anoxic brain injury, for which Neurology has seen the patient. No further work-up advised. The patient is improving well at this time.  7.  Shock liver secondary to septic shock improving. AST, ALT are  trending down slowly.   INPATIENT MEDICATIONS:  Include:  1.  Acetaminophen 650 q. 4 p.r.n.  2.  Zofran 4 mg IV q. 4 p.r.n.  3.  Amiodarone 400 mg oral daily.  4.  Morphine 2 mg IV q. 2 p.r.n. for pain.  5.  Insulin Levemir 6 units subcutaneous once a day at 1:00 p.m.  6.  Flagyl 500 mg IV q. 8 hours.  7.  Zantac 150 mg oral daily. 8.  Zoloft 150 mg oral daily.  9.  Vancomycin 125 mg oral 4 times a day.  10.  Insulin aspart 2 units subcutaneous 3 times a day with meals.  11.  MiraLax 17 grams oral 2 times a day as needed for constipation.  12.  Pulmicort nebulizer 2 times a day.  13.  DuoNeb q. 6 hours p.r.n.   PENDING WORK-UP. The patient will need follow-up with Nephrology and Cardiology during her stay at [facility]. Will need a cardiac catheter for further work-up of her V. tach. Monitor blood sugars a.c. and at bedtime, calorie count.   TIME SPENT ON THIS CASE: 55 minutes.      ____________________________ Molinda Bailiff [name] Harjo, MD srs:dp D: 05/26/2013 14:28:47 ET T: 05/26/2013 14:39:06 ET JOB#: [id number]  cc: Wardell Heath R. Xsavier Seeley, MD, <Dictator> [facility]   Orie Fisherman MD ELECTRONICALLY SIGNED 05/27/2013 14:09

## 2014-10-08 NOTE — Consult Note (Signed)
PATIENT NAME:  [name] MinorsHENDERSON, Hazleigh MR#:  [id number] DATE OF BIRTH:  05-07-1986  DATE OF CONSULTATION:  05/18/2013  CONSULTING PHYSICIAN:  Laurier NancyShaukat A. Khan, MD  INDICATION FOR CONSULTATION: Cardiac arrest with elevated troponin and unresponsiveness.   HISTORY OF PRESENT ILLNESS: This is a 29 year old African American female with a history of type 1 diabetes as well as Addison disease who presented to the Emergency Room being unresponsive and was admitted to the CCU with a diagnosis of acute renal failure and DKA along with hyperkalemia.   This morning around 9:00 a.m., she arrested and was coded for about 20 minutes. She apparently had ventricular tachycardia. She coded again a second time, so she coded twice today, and I was asked to evaluate the patient because her troponin came back more than 40.   She remains unresponsive, unable to get history from the patient. Talking to the nurses, apparently the patient was actually getting better. Her potassium when she came in was very high along with 1900 blood sugar, and it was all normalizing. Her acute renal failure was improving, and then she all of a sudden had cardiac arrest in CCU. She was coded with CPR for 20 minutes and was promptly intubated. Apparently she was well oxygenated during the codes she had.   She remains at this time unresponsive, is on ventilator support along with cooling support. She has been cooled.   PAST MEDICAL HISTORY: History of type 1 diabetes, Addison disease, diabetic neuropathy, and anxiety disorder.   MEDICATIONS:  Zofran, NovoLog, Levemir.   ALLERGIES: None.   SOCIAL HISTORY: No history of EtOH abuse or smoking.   FAMILY HISTORY: Positive for diabetes; otherwise, unremarkable.   PHYSICAL EXAMINATION:  GENERAL: Currently she is intubated, alert and oriented x0.  VITAL SIGNS: Blood pressure is 110/70. Respirations 20. Pulse is 110. Temperature 37.2.  HEENT: As I mentioned, unresponsive, fixed and dilated  pupils.  NECK: No JVD.  LUNGS: Good air entry.  HEART: Tachycardic. Normal S1, S2. No audible murmur.  ABDOMEN: Soft. No audible bowel sounds.  EXTREMITIES: No pedal edema.   DIAGNOSTIC STUDIES: EKG shows sinus tachycardia, 110 beats per minute, poor R-wave progression suggestive of old anteroseptal wall MI.   LABORATORY DATA: BUN is 82, creatinine 5.38. Phosphorus is 4.2. Magnesium is 4.8 initially and now is 2.3. Glucose is 203 and troponin is currently more than 40.   ASSESSMENT AND PLAN: The patient has cardiac arrest, most likely secondary to sepsis/electrolyte disturbance/diabetic ketoacidosis. She is currently on IV amiodarone, Levophed initially 75 mcg to maintain systolic blood pressure 100, right now 35 mcg. She appears to be unresponsive, however, is being cooled. According to the nurses, she was well oxygenated during cardiopulmonary resuscitation and was intubated promptly. It is hard to say whether there will be a recovery or not, but at this time, elevated troponin more than 40, most likely due to prolonged cardiopulmonary resuscitation plus along with ventricular tachycardia may have some underlying coronary artery disease due to diabetes even though she is only 27.   Advise starting the patient on aspirin and heparin and will continued to evaluate the patient's progress over time and decide whether further treatment is needed.   At this time, we will get an echocardiogram to evaluate for wall motion and ejection fraction. A cardiac catheterization at this time, the patient is too unstable to have that done plus elevated troponin may be due to prolonged cardiopulmonary resuscitation.   Thank you very much for the referral.  ____________________________ Wynelle LinkShaukat  Crista Luria, MD sak:np D: 05/18/2013 16:09:13 ET T: 05/18/2013 16:23:23 ET JOB#: [id number]  cc: Laurier Nancy, MD, <Dictator> Laurier Nancy MD ELECTRONICALLY SIGNED 05/25/2013 8:33

## 2014-10-08 NOTE — Consult Note (Signed)
Brief Consult Note: Diagnosis: ANEMIA, THROMBOCYTOPENIA, ARF, CRF, DM, ATN, SHOCK LIVER, DIC.   Patient was seen by consultant.   Orders entered.   Comments: PATIENT SEEN 12/3. REPEAT LABS ORDERED,  DICTATED NOTE TO FOLLOW.Marland Kitchen.Marland Kitchen.ANEMIA MULTIFACTORIAL, PREEXISTING ANEMIA, ACUTE AND CHRONIC ILLNESS AZOTEMIA, HYDRATION, NO EVIDENCE OF HEMOLYSIS, NO MICROANGIOPATHY, NO EVIDENCE ACUTE BLEED, THROMBOCYTOPENIA ALSO MULTIFACTORIAL, HYDRATION, INFECTION, POSSIBLE HEPARIN EFFECT ALTHOUGH DOUBT HIT AS ONSET MORE RAPID THAN EXPECTED, , MEDICATION EFFECT, DIC. THERE IS NO MICROANGIOPATHY OR TTP MECHANISM, SMEAR REVIEWED WITH PATHOLOGY...I AGREED WITH PRIMEDOC RE HOLD HEPARIN, B12 CHECKED. WOULD CHECK RETICS, COOMBS POLYSPECIFIC, IRON STUDIES, F/U LFTS, LATER MAY BE ROLE FOR EPO.  Electronic Signatures: Marin RobertsGittin, Gioia Ranes G (MD)  (Signed 04-Dec-14 14:52)  Authored: Brief Consult Note   Last Updated: 04-Dec-14 14:52 by Marin RobertsGittin, Azana Kiesler G (MD)

## 2014-10-08 NOTE — Consult Note (Signed)
PATIENT NAME:  [name] Ross MR#:  [id number] DATE OF BIRTH:  06-17-1986  DATE OF CONSULTATION:  05/22/2013 DATE OF ADMISSION:  05/17/2013   CONSULTING SERVICE:  Gastroenterology. CONSULTING PHYSICIAN:  Midge Miniumarren Marzella Miracle, MD  REASON FOR CONSULTATION: C. diff colitis.  HISTORY OF PRESENT ILLNESS: This patient is a 29 year old woman who came in with DKA and blood sugars over 1900. The patient had been in the CCU and had an episode of a witnessed arrest. The patient's troponin went to over 40, and she has since gained consciousness, but has been lethargic and has had mental status changes. As of yesterday, the patient was having profuse diarrhea, and her stools were sent off for C. diff. The patient was found to have abnormal liver enzymes after her resuscitation due to shock liver. The patient's C. diff came back with the antigen positive with C. diff toxin A and B negative and PCR for toxigenic C. diff was positive. The patient was started on both Flagyl and vancomycin, and the nursing staff reports that she has not had any further bowel movements. The patient's liver enzymes have started to come down since her initial episode of shock liver. The patient is not able to give much history today upon speaking to her. She denies having any pain at the present time.   PAST MEDICAL HISTORY: Diabetes type 1 since the age of 89, DKA.   SOCIAL HISTORY: No alcohol, tobacco or drug abuse.   MEDICATIONS: Amiodarone, metronidazole, Zantac, vancomycin, aspirin, insulin, albuterol inhaler, Pulmicort, calcium gluconate.   ALLERGIES: No known drug allergies.   FAMILY HISTORY: Noncontributory.   PHYSICAL EXAMINATION: GENERAL: The patient in bed, able to answer questions with yes and no. Will not elaborate much more than that.  VITAL SIGNS: Temperature 98.8, pulse 92, respirations 18, blood pressure 114/74, pulse oximetry 94%.  HEENT: Normocephalic, atraumatic. Extraocular motor intact. Pupils equally round and  reactive to light and accommodation without JVD, without lymphadenopathy.  LUNGS: Clear to auscultation bilaterally.  HEART: Regular rate and rhythm without murmurs, rubs or gallops.  ABDOMEN: Soft, nontender, nondistended without hepatosplenomegaly.  EXTREMITIES: Without cyanosis, clubbing or edema.  SKIN: Without any rashes or lesions.   ANCILLARY SERVICES: As stated above.   ASSESSMENT AND PLAN: This patient is a 29 year old woman who was found to have Clostridium diff. She is on treatment with Flagyl and vanco. She is not having any further diarrhea. The patient should continue on these medications for her C. diff.   Thank you very much for involving me in the care of this patient. If you have any questions, please do not hesitate to call.    ____________________________ Midge Miniumarren Joell Buerger, MD dw:dmm D: 05/22/2013 19:15:28 ET T: 05/22/2013 20:50:58 ET JOB#: [id number]  cc: Midge Miniumarren Jeani Fassnacht, MD, <Dictator> Midge MiniumARREN Alizia Greif MD ELECTRONICALLY SIGNED 05/24/2013 16:05

## 2014-10-08 NOTE — Discharge Summary (Signed)
PATIENT NAME:  [name], Melesa MR#:  [id number] DATE OF BIRTH:  08/24/1985  DATE OF ADMISSION:  08/14/2012 DATE OF DISCHARGE:  08/17/2012  DISCHARGE DIAGNOSES: 1.  Gastroenteritis. 2.  Acute on chronic kidney injury.  3.  Type 1 diabetes mellitus.   PROCEDURE: Central venous catheter line placement by Dr. Gilda CreaseSchnier.  CONSULTATIONS: 1.  Levora DredgeGregory Schnier, MD - Vascular Surgery. 2.  Kolluru, MD - Nephrology.  DISCHARGE MEDICATIONS:  None. The patient signed out against medical advice.   HOSPITAL COURSE: This lady was admitted through the Emergency Room with a history of several days of intractable nausea and vomiting and poor p.o. intake. Please refer to the history and physical for full details. At presentation she had acute on chronic kidney injury. She was hyperglycemic, although not ketotic. She was hydrated with normal saline. Her diet was gradually advanced as her nausea improved. Her kidney function responded well to fluids. A nephrology consultation was placed with Dr. Wynelle LinkKolluru who agreed with the current management. Plan was to discharge the patient after renal function had normalized or at least had returned to her baseline as her p.o. intake was improving. However, the patient decided to leave against medical advice.    DISCHARGE INSTRUCTIONS:  None.  DISCHARGE FOLLOWUP:  The patient was scheduled to establish care with Dr. Yves DillNeelam Khan. Unclear whether she will proceed with this.       DIET: ADA diet.   DISCHARGE PROCESS TIME SPENT: 20 minutes.  ____________________________ Silas FloodSheikh A. Ellsworth Lennoxejan-Sie, MD sat:sb D: 09/02/2012 12:20:00 ET T: 09/02/2012 12:44:29 ET JOB#: [id number]  cc: Sheikh A. Ellsworth Lennoxejan-Sie, MD, <Dictator> Charlesetta GaribaldiSHEIKH A TEJAN-SIE MD ELECTRONICALLY SIGNED 09/03/2012 14:45

## 2014-10-08 NOTE — Consult Note (Signed)
Chief Complaint:  Subjective/Chief Complaint Patient without any further diarrhea since before GI consult was called. Patent on treatment for her positive c.diff.   VITAL SIGNS/ANCILLARY NOTES: **Vital Signs.:   07-Dec-14 06:00  Vital Signs Type Routine  Pulse Pulse 76  Respirations Respirations 16  Systolic BP Systolic BP 89  Diastolic BP (mmHg) Diastolic BP (mmHg) 47  Mean BP 61  Pulse Ox % Pulse Ox % 100  Oxygen Delivery 2L  Pulse Ox Heart Rate 76   Brief Assessment:  GEN well developed, well nourished, Non verbal   Respiratory normal resp effort  no use of accessory muscles   Gastrointestinal details normal Soft   Lab Results: Routine Chem:  07-Dec-14 04:00   Glucose, Serum  104  BUN  70  Creatinine (comp)  5.96  Sodium, Serum 140  Potassium, Serum  3.3  Chloride, Serum 105  CO2, Serum 23  Calcium (Total), Serum  6.7  Anion Gap 12  Osmolality (calc) 300  eGFR (African American)  10  eGFR (Non-African American)  9 (eGFR values <38m/min/1.73 m2 may be an indication of chronic kidney disease (CKD). Calculated eGFR is useful in patients with stable renal function. The eGFR calculation will not be reliable in acutely ill patients when serum creatinine is changing rapidly. It is not useful in  patients on dialysis. The eGFR calculation may not be applicable to patients at the low and high extremes of body sizes, pregnant women, and vegetarians.)  Result Comment CALCIUM - RESULTS VERIFIED BY REPEAT TESTING.  - NOTIFIED OF CRITICAL VALUE  - [facility]  - READ-BACK PROCESS PERFORMED.  Result(s) reported on 24 May 2013 at 04:25AM.  Routine Hem:  07-Dec-14 04:00   WBC (CBC) 4.8  RBC (CBC)  2.55  Hemoglobin (CBC)  7.6  Hematocrit (CBC)  22.1  Platelet Count (CBC)  77  MCV 87  MCH 29.9  MCHC 34.5  RDW  17.8  Neutrophil % 62.9  Lymphocyte % 25.9  Monocyte % 8.1  Eosinophil % 2.4  Basophil % 0.7  Neutrophil # 3.0  Lymphocyte # 1.2   Monocyte # 0.4  Eosinophil # 0.1  Basophil # 0.0 (Result(s) reported on 24 May 2013 at 04:21AM.)   Assessment/Plan:  Assessment/Plan:  Assessment C. diff colitis.   Plan Patient without any further diarrhea. Will sign off. Continue antibiotics for a total of 14 days. Please call with any questions.   Electronic Signatures: WLucilla Lame(MD)  (Signed 07-Dec-14 08:50)  Authored: Chief Complaint, VITAL SIGNS/ANCILLARY NOTES, Brief Assessment, Lab Results, Assessment/Plan   Last Updated: 07-Dec-14 08:50 by WLucilla Lame(MD)

## 2014-10-10 NOTE — Consult Note (Signed)
PATIENT NAME:  [name] Ross, ECONOMOU MR#:  [id number] DATE OF BIRTH:  [birth year]  DATE OF CONSULTATION:  10/26/2011  REFERRING PHYSICIAN:  Enedina Finner, MD  CONSULTING PHYSICIAN:  A. Wendall Mola, MD  CHIEF COMPLAINT: Uncontrolled diabetes.   HISTORY OF PRESENT ILLNESS: This is a 29 year old female seen in consultation for uncontrolled diabetes. She was admitted yesterday in septic shock, DKA, and acute on chronic renal failure. On presentation yesterday she was agitated and required intubation and ventilation. Her blood sugar was 645, creatinine was 3.21, bicarbonate 5. She had an elevated lactic acid level of 2.8 Anion gap was 27 and she had 2+ urinary ketones consistent with diabetic ketoacidosis. Her arterial pH was 6.92. She remains intubated and on a ventilator. She was initially placed on an insulin drip and had gradual improvement in her blood sugars. D5 0.45% NS at 150 mL an hour was started around 8:30 a.m. Glargine 50 units subcutaneous was given at 10:00 a.m. after insulin drip was stopped around 9:00 a.m. She had a low blood sugar at 12:40 at 53 and at 1:15 of 50 and was given an amp of D50. Blood sugars are now in the 90s. The patient was unable to be interviewed. A friend at the bedside identified himself as her fiance. He did not know if she had been compliant with her insulin.   She is well known to me from prior hospitalizations. She has a history of type 1 diabetes since age 22. Diabetes is complicated by neuropathy. Diabetes has been chronically poorly controlled and she has had multiple hospitalizations in recent years for diabetic ketoacidosis. A1c has been around 11%. Her previous outpatient regimen at the time of hospital discharge in January 2013 was Lantus 17 units at bedtime and NovoLog 4 units before meals or 1 unit per 10 grams of carbohydrates plus an additional correction of 1 unit per every sugar of 30 over target of 120.   The patient also has hypothyroidism and as an  outpatient has taken levothyroxine 75 mcg per day. In January TSH was low at 0.062 and in December her TSH had been normal at 0.77.   There was a possible history of adrenal insufficiency at some time in the past, although on interviewing the patient during her last hospitalization in December-January, she related that she had not been taking steroids since at least 2009 after she was retested for adrenal insufficiency and was told that it had resolved. On admission here she was started on hydrocortisone 100 mg q. 8 hours IV.   PAST MEDICAL HISTORY:  1. Type 1 diabetes.  2. Diabetic neuropathy.  3. Recurrent diabetic ketoacidosis.  4. Hypothyroidism.  5. History of adrenal insufficiency in 2009, resolved.  6. History of anxiety/depressive disorder.  7. Pregnancy January 2013, status post miscarriage (spontaneous versus intentional?)   INPATIENT MEDICATIONS:  1. Lantus 50 units b.i.d.  2. Hydrocortisone 100 mg q. 6 hours.  3. Glulisine sliding scale.  4. Albuterol 8 puffs q. 4 hours.  5. D5 NS 0.45% at 150 mL/h.  6. Fentanyl drip.  7. Levophed drip.  8. Versed drip.   ALLERGIES: No known drug allergies.   SOCIAL HISTORY: Unable to obtain.   FAMILY HISTORY:  One grandfather with type 2 diabetes, per prior records.  REVIEW OF SYSTEMS: Unable to obtain.   PHYSICAL EXAMINATION:  VITAL SIGNS: Temperature 96.6, pulse 74, respirations 16, blood pressure 117/66, pulse oximetry 96%.   GENERAL: African American female currently intubated and sedated.   HEENT:  ETT tube in place. Normocephalic, atraumatic.   NECK: Supple.   LYMPH: No submandibular lymphadenopathy.   PULMONARY: Clear anteriorly bilaterally.   CARDIAC: Regular rate and rhythm. No murmur. No carotid bruit.   ABDOMEN: Mildly distended. No hepatosplenomegaly is appreciated. No mass. Hypoactive bowel sounds.   EXTREMITIES: 2+ edema is present in the extremities.   MUSCULOSKELETAL: Decreased motor tone throughout.   NEUROLOGIC: Unable to assess.   PSYCHIATRIC: Unable to assess.   LABS/STUDIES: Glucose 97, BUN 16, creatinine 2.25, sodium 145, potassium 3.2, chloride 110, CO2 26, calcium 7.4.  Urine hCG negative.   ASSESSMENT: 29 year old female admitted yesterday in septic shock due to urinary tract infection with acute renal failure and diabetic ketoacidosis. She has required renal replacement therapy due to her acute renal failure and intubation and ventilation for her septic shock with acidosis. Diabetic ketoacidosis has resolved and she has been transitioned to subcutaneous insulin.   RECOMMENDATIONS:  1. Based on her previous dosing, I would estimate her total daily requirement of insulin to be in the range of 17 to 30 units. She may require more insulin at this time due to stress as well as the IV dextrose and the steroids. She already given Lantus 50 units this morning. I plan to hold her evening dose and give her a lower dose tomorrow of just 20 units.  2. We will modify her sliding scale to reflect 1 unit per every sugar of 30 over a target of 150, which is more reasonable and should not lead to hypoglycemia.   3. For hypothyroidism, I will restart her outpatient dosing of levothyroxine.  4. No evidence of adrenal insufficiency. I do not plan to change her steroids today, however, would caution the primary team to taper down the steroids as tolerated as I do not think she has adrenal insufficiency.   I will follow along with you. Thank you for the kind request for consultation.    ____________________________ A. Wendall MolaMelissa Solum, MD ams:bjt D: 10/26/2011 16:21:43 ET T: 10/27/2011 12:57:41 ET JOB#: [id number]  cc: A. Wendall MolaMelissa Solum, MD, <Dictator> Macy MisA. MELISSA SOLUM MD ELECTRONICALLY SIGNED 11/05/2011 17:32

## 2014-10-10 NOTE — Consult Note (Signed)
PATIENT NAME:  [name] Ross, [name] Ross MR#:  [id number] DATE OF BIRTH:  02-06-1986  DATE OF CONSULTATION:  10/29/2011  REFERRING PHYSICIAN:  Shaune Pollack, MD CONSULTING PHYSICIAN:  Rosalyn Gess. Jacoria Keiffer, MD  REASON FOR CONSULTATION: Diabetic patient with DKA.   HISTORY OF PRESENT ILLNESS: The patient is a 29 year old black female with a past history significant for type 1 diabetes who was admitted on 10/25/2011 with confusion and malaise. She does not recall much of the events leading up to her hospitalization other than having some shortness of breath. She was recently hospitalized at an outside hospital for a urinary tract infection. She does not recall exactly when this was but believes it was the week prior to her admission. She was discharged home on antibiotics but did not get the prescription filled. She thinks that she had 2 or 3 days without antibiotics before she started feeling poorly. She states that her main symptom has been difficulty breathing. She did not have any sputum production or hemoptysis. She denies any urinary tract symptoms, specifically no dysuria, no  hematuria and no malaise. She does not recall any increased urinary frequency either. She has not had any nausea or vomiting or change in her bowels. She denies any rashes. She has had no joint or muscle complaints. She was admitted to the hospital and found to be in DKA. She was given IV insulin and her anion gap cleared and she has improved. She required intubation for a period of time as well. She has been treated with Zosyn and given one dose of vancomycin initially. She remains on Zosyn currently. She has remained afebrile during her hospitalization. Her white count which was 25.2 on admission has slowly come down and today was 14.8.   ALLERGIES: No known drug allergies.  PAST MEDICAL HISTORY:  1. Type 1 diabetes.  2. Recent urinary tract infection.  3. Addison's disease.  4. Peripheral neuropathy.  5. Gastroparesis.   6. Depression.   SOCIAL HISTORY: The patient lives with her boyfriend, although since her recent hospitalization she had been staying with her grandmother. She goes to [employer]. She does not smoke. She does not drink.   FAMILY HISTORY: Positive for hypertension.  REVIEW OF SYSTEMS: GENERAL: She does not recall much of the events leading up to her hospitalization, but she states that she does not recall any fevers, chills, or sweats. She did have some malaise. HEENT: No headaches. No sinus congestion. No sore throat. NECK: No stiffness. No swollen glands. RESPIRATORY: Some shortness of breath, but no cough. No sputum production. CARDIAC: No chest pain or palpitations. GI: No nausea, no vomiting, no abdominal pain, and no change in her bowels. GENITOURINARY: No change in her urine. MUSCULOSKELETAL: No muscle or joint complaints. SKIN: No rashes. NEUROLOGIC: No focal weakness. PSYCHIATRIC: No complaints. All other systems are negative.   PHYSICAL EXAMINATION:   VITAL SIGNS: T-max 98.9, T-current 97.5, pulse 81, blood pressure 150/80, and 93% on room air.   GENERAL: A 29 year old black female in no acute distress.   HEENT: Normocephalic, atraumatic. Pupils equal and reactive to light. Extraocular motion intact. Sclerae, conjunctivae, and lids are without evidence for emboli or petechiae. Oropharynx shows no erythema or exudate. Teeth and gums are in fair condition.   NECK: Supple. Full range of motion. Midline trachea. No lymphadenopathy. No thyromegaly.   LUNGS: Clear to auscultation bilaterally. Good air movement. No focal consolidation.   HEART: Regular rate and rhythm without murmur, rub, or gallop.   ABDOMEN: Soft,  nontender, and nondistended. No hepatosplenomegaly. No hernia is noted.   EXTREMITIES: No evidence for tenosynovitis.   SKIN: No rashes. No stigmata of endocarditis, specifically no Janeway lesions or Osler nodes.   NEUROLOGIC: The patient was quiet but  answered questions appropriately. She was moving all four extremities.   PSYCHIATRIC: Mood and affect appeared normal.   LABS/STUDIES: BUN 16, creatinine 2.33, bicarbonate 28, and anion gap 6.. Her bicarbonate was 5 with an anion gap of 27 on admission. LFTs were unremarkable on admission. White count today is 14.8 with a hemoglobin of 9.2, platelet count 220, and ANC 12.6. On admission, the patient had a white count of 25.2.   Urinalysis from admission had greater than 500 glucose, 2+ ketones, 3+ blood, greater than 500 protein, negative nitrites, 3+ leukocyte esterase, 333 red cells, and 921 white cells.   Urine and blood cultures from admission are negative.   C. difficile PCR was negative.   A urinalysis from 10/28/2011 showed negative glucose, 2+ blood, negative nitrites, negative protein, and 3+ leukocyte esterase with 13 red cells and 87 white cells.   Urine pregnancy test was negative.   A chest x-ray from admission shows no acute cardiopulmonary disease.   A CT scan of the head without contrast showed no abnormality.   Renal ultrasound showed no hydronephrosis.   Chest x-ray from 10/27/2011 showed decreased interstitial edema with some lung base atelectasis.   IMPRESSION: A 29 year old black female with a past history significant for type 1 diabetes who was admitted with DKA.   RECOMMENDATIONS:  1. She was recently admitted to another hospital with a urinary tract infection. She did not fill her antibiotic prescription after discharge. Her urinalysis had pyuria on admission. Despite this her urine culture and blood cultures were negative on admission. It is unclear to me whether she is actively infected. She has leukocytosis which can occur with DKA, but as she is out of DKA I would have expected that this would rapidly return to normal.  2. Infection is certainly a possible cause of deep DKA, but she does not relate any specific symptoms. She denies any cough, but has had some  shortness of breath which is likely from the ketoacidosis itself. She has no urinary symptoms and no GI symptoms.  3. We will change her antibiotics to Levaquin. We will treat her for a total of seven days.  4. I would follow her white count.  5. I would discontinue her Foley catheter as soon as possible.   This is a moderately complex infectious disease case. Thank you very much for involving me in Ms. Tonelli's care. ___________________________ Rosalyn GessMichael E. Kanin Lia, MD meb:slb D: 10/29/2011 16:20:21 ET T: 10/30/2011 08:51:43 ET JOB#: [id number]  cc: Rosalyn GessMichael E. Alexis Mizuno, MD, <Dictator> Melesa Lecy E Eden Toohey MD ELECTRONICALLY SIGNED 11/07/2011 12:43

## 2014-10-10 NOTE — Discharge Summary (Signed)
PATIENT NAME:  [name] Ross, [name] Ross MR#:  [id number] DATE OF BIRTH:  [birth year]  DATE OF ADMISSION:  07/09/2011 DATE OF DISCHARGE:  07/12/2011  DISCHARGE DIAGNOSES:  1. Diabetic ketoacidosis.  2. Pregnancy.  3. Type 1 diabetes.  4. Acute renal failure.  5. Hypernatremia.  6. Hypothyroidism.   HISTORY OF PRESENT ILLNESS/HOSPITAL COURSE: This is a 29 year old female with a history of uncontrolled type 1 diabetes and hypothyroidism, who presented to the ED eight weeks pregnant with nausea and vomiting. Initial labs showed a sugar of over 500, bicarbonate 16, anion gap 22, positive urinary ketones, arterial pH of 7.3 consistent with diabetic ketoacidosis. Additionally labs showed a creatinine of 2.48 consistent with acute renal failure. She had been previously admitted to this hospital just three weeks ago also for diabetic ketoacidosis. She reported good compliance with her insulin regimen and recalled that sugars had been running high for several days. With IV insulin and IV fluids, sugars improved and she was transitioned to subcutaneous basal/bolus therapy. She had no acute OB issues during her hospitalization. [facility] her daily. Sugars were variable and appetite remained poor. She had no further emesis. She had mild hypernatremia on hospital day two which resolved with modification of fluids. She was continued on levothyroxine for hypothyroidism, however, due to a TSH of 0.062, her dose of levothyroxine was reduced to 50 mcg (from her previous dose of 75 mcg) daily. She did express desire to terminate the pregnancy. [facility] on Friday, 07/20/2011 at 12:45 due to her high risk pregnancy, however, should she choose to terminate the pregnancy she will need to cancel that appointment.   LABORATORY, DIAGNOSTIC AND RADIOLOGICAL DATA: Lab studies at time of discharge: Glucose 240, BUN 16, creatinine 1.52, potassium  3.5, sodium 138, albumin 2.6, AST 27, ALT 19. On 07/10/2011 TSH was 0.062, hemoglobin 10.3, hematocrit 31.   DISCHARGE MEDICATIONS:  1. Levothyroxine 50 mcg daily.  2. Lantus 17 units at bedtime.  3. Aspart 4 units before meals or 1 unit per 10 gram of carbohydrates.  4. NovoLog sliding scale 1 unit per sugar of 30 over a target of 120.   DISCHARGE INSTRUCTIONS:  1. Reduce levothyroxine to 50 mcg daily.  2. She is to takeher insulin as prescribed and check her bloos sugars at least 4 times daily. 3. She is to follow up [place] at her soonest convenience.   ____________________________ A. Wendall MolaMelissa Dallyn Bergland, MD ams:cms D: 07/12/2011 14:24:27 ET T: 07/13/2011 09:14:10 ET JOB#: [id number]  cc: A. Wendall MolaMelissa Shun Pletz, MD, <Dictator>  Dr. Sherryll BurgerShah, endocrinologist, [place], [place]  A. Wendall MolaMELISSA Colon Rueth MD ELECTRONICALLY SIGNED 07/13/2011 12:13

## 2014-10-10 NOTE — Op Note (Signed)
PATIENT NAME:  [name] Ross, [name] Ross MR#:  [id number] DATE OF BIRTH:  12-10-1985  DATE OF PROCEDURE:  10/25/2011  PREOPERATIVE DIAGNOSES:  1. Severe metabolic acidosis.  2. Acute renal insufficiency.  3. Diabetic ketoacidosis.  4. Urinary sepsis.   POSTOPERATIVE DIAGNOSES: 1. Severe metabolic acidosis.  2. Acute renal insufficiency.  3. Diabetic ketoacidosis.  4. Urinary sepsis.      PROCEDURE PERFORMED: Insertion of right IJ double lumen dialysis catheter with ultrasound guidance.   SURGEON: Renford DillsGregory G. Malu Pellegrini, M.D.   DESCRIPTION OF PROCEDURE: The patient is obtunded, intubated in the Intensive Care Unit. She is positioned supine. The right neck is prepped and draped in a sterile fashion. 1% lidocaine is infiltrated in the soft tissues overlying the neck and ultrasound is placed in a sterile sleeve. Ultrasound is utilized secondary to lack of appropriate landmarks and to avoid vascular injury. Under direct ultrasound visualization, jugular vein is identified. It is echolucent, homogeneous, easily compressible indicating patency. The image is recorded for the record. Under real-time visualization, Seldinger needle is inserted into the jugular vein. J-wire is advanced followed by a counterincision. Dilator is passed over the wire and subsequently the double lumen dialysis catheter is advanced without difficulty. Both lumens aspirate and flush easily. Chest x-ray demonstrates catheter in good position. No hemopneumothorax. The patient tolerated the procedure without change in her status. She will be initiated on CRT.   ____________________________ Renford DillsGregory G. Rolin Schult, MD ggs:ap D: 10/26/2011 09:59:00 ET              T: 10/26/2011 12:00:28 ET                JOB#: [id number] cc: Renford DillsGregory G. Zekiah Caruth, MD, <Dictator> Renford DillsGREGORY G Karnisha Lefebre MD ELECTRONICALLY SIGNED 11/05/2011 17:41

## 2014-10-10 NOTE — Consult Note (Signed)
Present Illness PAST MEDICAL HISTORY:  1. Type 1 diabetes since age 29.  2. Addison's disease.  3. Depression.  4. Peripheral neuropathy secondary to diabetes.  5. Diabetic gastroparesis.   PAST SURGICAL HISTORY: No surgical history   Home Medications: Medication Instructions Status  NovoLog 100 units/mL subcutaneous solution Patient on sliding scale. Active    No Known Allergies:   Case History:   Family History Non-Contributory    Social History negative ETOH, negative Illicit drugs   Review of Systems:   ROS Pt not able to provide ROS   Physical Exam:   GEN well developed, critically ill appearing    HEENT dry oral mucosa, good dentition    NECK supple  trachea midline    RESP on vent    CARD regular rate  No LE edema  positive JVD    ABD hypoactive BS  nondistended    EXTR negative cyanosis/clubbing, negative edema    SKIN No rashes, tight to palpation    PSYCH sedated, intubated sedated   Nursing/Ancillary Notes: **Vital Signs.:   09-May-13 20:00   Pulse Pulse 80   Respirations Respirations 21   Systolic BP Systolic BP 003   Diastolic BP (mmHg) Diastolic BP (mmHg) 53   Mean BP 69   Pulse Ox % Pulse Ox % 100   Pulse Ox Heart Rate 82   Nurse Fingerstick (mg/dL) FSBS (fasting range 65-99 mg/dL) 570   Comments/Interventions  insulin gtt   Lab:  09-May-13 13:55    pH (ABG) 6.92   PCO2 22   PO2 91   FiO2 21   Specimen Type (ABG) ARTERIAL   Patient Temp (ABG) 37.0   Lactic Acid, Cardiopulmonary 2.80  Routine Chem:  09-May-13 14:04    Glucose, Serum 645   BUN 26   Creatinine (comp) 3.21   Sodium, Serum 131   Potassium, Serum 6.3   Chloride, Serum 99   CO2, Serum 5   Calcium (Total), Serum 7.9  Hepatic:  09-May-13 14:04    Bilirubin, Total 0.5   Alkaline Phosphatase 111   SGPT (ALT) 13   SGOT (AST) 14   Total Protein, Serum 6.9   Albumin, Serum 2.1  Routine Chem:  09-May-13 14:04    Osmolality (calc) 298   eGFR (African American)  22   eGFR (Non-African American) 19   Anion Gap 27  Routine Hem:  09-May-13 14:04    WBC (CBC) 25.2   RBC (CBC) 2.62   Hemoglobin (CBC) 8.1   Hematocrit (CBC) 28.3   Platelet Count (CBC) 505   MCV 108   MCH 30.8   MCHC 28.6   RDW 16.5  Routine UA:  09-May-13 14:57    Color (UA) Amber   Clarity (UA) Turbid   Glucose (UA) >=500   Bilirubin (UA) Negative   Ketones (UA) 2+   Specific Gravity (UA) 1.011   Blood (UA) 3+   pH (UA) 6.0   Protein (UA) >=500   Nitrite (UA) Negative   Leukocyte Esterase (UA) 3+   RBC (UA) 333 /HPF   WBC (UA) 921 /HPF   Bacteria (UA) TRACE   Epithelial Cells (UA) 2 /HPF   WBC Clump (UA) PRESENT   Mucous (UA) PRESENT  Lab:  09-May-13 16:45    pH (ABG) 6.92   PCO2 < 19   PO2 164   FiO2 40   Patient Temp (ABG) 37.0   O2 Saturation 97   Vt 500   PEEP 5.0  Mechanical Rate 16  Blood Glucose:  09-May-13 18:13    POCT Blood Glucose >600  Routine Chem:  09-May-13 18:43    Glucose, Serum 640   BUN 29   Creatinine (comp) 3.32   Sodium, Serum 131   Potassium, Serum 6.9   Chloride, Serum 101   CO2, Serum 6   Calcium (Total), Serum 7.2   Osmolality (calc) 299   eGFR (African American) 21   eGFR (Non-African American) 18   Anion Gap 24  Blood Glucose:  09-May-13 19:12    POCT Blood Glucose 588  Lab:  09-May-13 19:32    pH (ABG) 6.92   PCO2 21   PO2 120   FiO2 35   Specimen Type (ABG) ARTERIAL   Patient Temp (ABG) 37.0   O2 Saturation 95.8   Vt 500   PEEP 5.0   Mechanical Rate 16   Base Excess -27.1   HCO3 4.3  Blood Glucose:  09-May-13 20:18    POCT Blood Glucose 570    20:57    POCT Blood Glucose 554  Routine Chem:  09-May-13 21:49    Glucose, Serum 485   BUN 28   Creatinine (comp) 3.31   Sodium, Serum 139   Potassium, Serum 5.4   Chloride, Serum 104   CO2, Serum 12   Calcium (Total), Serum 7.1   Osmolality (calc) 304   eGFR (African American) 21   eGFR (Non-African American) 19   Anion Gap 23     Impression  1.  Profound metabolic acidosis         patient will be started on CVVH          Right IJ catheter will be placed 2.  Acute rena failure         see #1 3.  Urosepsis          on abx 4.  Respiratory failure          intubated on vent          critically ill   Electronic Signatures: Hortencia Pilar (MD)  (Signed 09-May-13 23:08)  Authored: General Aspect/Present Illness, Home Medications, Allergies, History and Physical Exam, Vital Signs, Labs, Impression/Plan   Last Updated: 09-May-13 23:08 by Hortencia Pilar (MD)

## 2014-10-10 NOTE — H&P (Signed)
PATIENT NAME:  [name] Ross Ross, [name] Ross MR#:  [id number] DATE OF BIRTH:  22-Sep-1985  DATE OF ADMISSION:  10/25/2011  PRIMARY CARE PHYSICIAN: Unknown.   CHIEF COMPLAINT: Altered mental status and not feeling well.   HISTORY OF PRESENT ILLNESS: [name] Ross Ross is a 29 year old Caucasian female who was brought in from home by her mother with not feeling well and altered mental status, along with feeling cold and clammy. She came in with a blood pressure of 81/40 and was very cold and clammy. The patient was yelling, screaming, and had altered mental status. She finally got intubated and placed on a vent. She was found to be very acidotic, dehydrated. Her urine bag showed pyuria. She had an elevated white count of 25,000. The patient is being admitted for septic shock, diabetic ketoacidosis, metabolic encephalopathy and for further management.   PAST MEDICAL HISTORY:  1. Type 1 diabetes since age 89.  2. Addison's disease.  3. Depression.  4. Peripheral neuropathy secondary to diabetes.  5. Diabetic gastroparesis.   PAST SURGICAL HISTORY: No surgical history according to old records.   ALLERGIES: No known drug allergies.   MEDICATIONS: I do not know at this time, however, her mother states the patient takes Lantus and NovoLog sliding scale.   FAMILY HISTORY: Positive for hypertension.   SOCIAL HISTORY: Per mother, the patient is enrolled at [employer]. She is a nonsmoker, nonalcoholic. She does not work. She lives with her boyfriend.   REVIEW OF SYSTEMS: Review of systems is unobtainable.  The patient is intubated on the vent.  PHYSICAL EXAMINATION:  GENERAL: The patient appears critically ill. She is afebrile, pulse is 82, blood pressure is 132/70, pulse oximetry is 100% on current vent settings.   HEENT: Atraumatic, normocephalic. Pupils are equal, round, and reactive to light and accommodation. Extraocular movements are intact. Oral mucosa is moist.   NECK: Supple. He no JVD.  No carotid bruit.   RESPIRATORY: Clear to auscultation bilaterally. No rales, rhonchi, respiratory distress, or labored breathing.   HEART: Both the heart sounds are normal. Rate, rhythm is regular. PMI is not lateralized. Chest is nontender.   EXTREMITIES: Good femoral pulses, unable to palpate much pedal pulses as they are quite feeble.   SKIN: Warm and dry.   NEUROLOGICAL:  Unable to assess. The patient is intubated on the vent.   LABORATORY, DIAGNOSTIC AND RADIOLOGICAL DATA:  Chest x-ray: No acute cardiopulmonary abnormality.  CT of the head: No acute abnormality.  Urinalysis positive for severe urinary tract infection.  Glucose is 645, BUN 26, creatinine 3.21, sodium 131, potassium 6.3, chloride 99, bicarbonate is 5, calcium is 7.9. SGOT is 14.0. Albumin is 2.1.  Anion gap is 27.0. White count is 25.2, hemoglobin and hematocrit is 8.1 and 28.3, platelet count is 505, MCV is 108.  Lactic acid is 2.8, pH is 6.92, pCO2 is 22. Bicarbonate and base excess unable to calculate.  EKG shows normal sinus rhythm.   ASSESSMENT: [name] Ross Ross is a 29 year old with: 1. Septic shock secondary to severe complicated urinary tract infection. The patient has very cloudy and dirty urine with pyuria and hematuria.  2. Severe metabolic acidosis with encephalopathy requiring intubation.  3. Diabetic ketoacidosis with anion gap acidosis.  4. Acute renal failure due to diabetic ketoacidosis.  5. Hyperkalemia and renal failure.  6. Leukocytosis.  7. Hypothyroidism.  8. History of Addison's disease.   PLAN:  1. Admit the patient to the Critical Care Unit. 2. She will be n.p.o.   3.  We will start the patient on Versed and fentanyl drip.  4. Continue Levophed and wean it off if MAP is greater than 65.  5. Continue IV hydration aggressively. The patient has already got 3 liters of IV fluids in the ER. 6. We will monitor metabolic panel every 6 hours.  7. I will cover the patient with hydrocortisone  stress doses every 6 hours.  8. Heparin subcutaneous b.i.d. for deep vein thrombosis prophylaxis.  9. Broad-spectrum antibiotics with Zosyn and vancomycin. Pharmacy to do the dosing.  10. Pulmonary consultation with Dr. Welton Flakes. I spoke with Dr. Welton Flakes regarding vent management.  11. We will do Endocrinology consultation with Dr. Tedd Sias for diabetes management.  12. Further work-up according to the patient's clinical course.   The hospital admission plan was discussed with the patient's mother. The patient is critically ill and has a guarded prognosis at present.   CODE STATUS:  FULL CODE.     CRITICAL TIME SPENT:  60 minutes.   ____________________________ Wylie Hail Allena Katz, MD sap:cbb D: 10/25/2011 16:59:27 ET T: 10/25/2011 17:36:20 ET JOB#: [id number]  cc: Kammi Ross A. Allena Katz, MD, <Dictator> Willow Ora MD ELECTRONICALLY SIGNED 10/28/2011 13:57

## 2014-10-10 NOTE — Consult Note (Signed)
Chief Complaint and History:   Referring Physician Dr. Payton Doughty    Chief Complaint DKA    History of Present Illness 29 yo F who is [redacted] weeks pregnant with history of diabetes presented to the ED with 3 days of nausea and vomiting. Labs howed sugar > 500, bicarb 16, AG 22 and pH 7.3 consistent with DKA. She was admitted for DKA just 3 weeks ago and was discharged on  regimen of Lantus 15 units qHS and NovoLog 5 units qAC plus correction. She reports compliance with this regimen. She is checking her sugars several times per day and claims they have been persistently high. She denies fever, chills, or dysuria. She has some mild abdominal discomfort. No diarrhea. No vaginal bleeding. She has not yet established OB care. She is not sure whether she wants to continue the pregnancy.    Past History PAST SURGICAL HISTORY: No past surgical history.  PAST MEDICAL HISTORY:  1. Type 1 diabetes mellitus.                             A. Diabetic neuropathy with gastroparesis                             B. DKA 06/2011, 01/2009 2. History of adrenal insufficiency in 2009 3. Hypothyroidism.  4. History of anxiety/depression.   DRUG ALLERGIES: No known drug allergies.   MEDICATIONS: 1. Lantus 15 units subcutaneous every qHS  2. NovoLog 5 units qAC plus correction 3. Synthroid 75 mcg daily.   FAMILY HISTORY: Grandfather with history of type 2 diabetes mellitus. Mother is an alcoholic and smokes tobacco. Father is alive and healthy.   SOCIAL HISTORY: Negative for tobacco, alcohol, or illicit drug use. The patient lives in [place] at home with her mother. 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   Allergies:  No Known Allergies:   Review of Systems:   Review of Systems No weight loss or fever. CARD: no chest pain or palpitations. HEENT: no blurred vision or sore throat. No rhinirrhea. PULM: No sob or cough. ABD: no diarrhea. +N/V as per HPI. GU: No  dysuria or heamturia. SKIN: no rash or pruritis. MSK: no weakness or myalgias. NECK: No dysphagia or odynophagia. END: no heat/cold intolerance. HEME: no easy bruisability or recent bleeding.   Physical Exam:   General WD F in NAD    Skin No rash    Eyes EOMI, no icterus    ENMT OP clear    Head and Neck Supple, no thyromegaly    Respiratory and Thorax Clear b/l, no wheeze or rhonchi. Normal inspiratory effort.    Cardiovascular tachycardic, no murmur.    Gastrointestinal Soft, NT/ND, NABS.    Musculoskeletal Moves all extremities, nl motor tone    Neurological No cognitive deficits    Psychiatric Affect flat, answers questions and is appropriate.   VITAL SIGNS/ANCILLARY NOTES: *Intake and Output.:   21-Jan-13 20:27   Grand Totals Intake:  5.4 Output:      Net:  5.4 24 Hr.:  5.4   IV (Primary)      In:  5.4    20:27   Weight Type admission   Weight Method Bed   Current Weight (lbs) (lbs) 102.9   Current Weight (kg) (kg) 46.7   Height (ft) (feet) 5   Height (in) (  in) 2   Height (cm) centimeters 157.4   BSA (m2) 1.4   BMI (kg/m2) 18.8   Laboratory Results:  Hepatic:  21-Jan-13 15:50    Bilirubin, Total 0.6   Alkaline Phosphatase 100   SGPT (ALT) 16   SGOT (AST)   7   Total Protein, Serum   8.8   Albumin, Serum   3.3  Routine Chem:  21-Jan-13 15:50    Glucose, Serum   594   BUN   54   Creatinine (comp)   2.48   Sodium, Serum 145   Potassium, Serum 4.7   Chloride, Serum 107   CO2, Serum   16   Calcium (Total), Serum 9.7   Osmolality (calc) 331   eGFR (African American)   31   eGFR (Non-African American)   25   Anion Gap   22  Routine Hem:  21-Jan-13 15:50    WBC (CBC)   15.8   RBC (CBC) 4.73   Hemoglobin (CBC) 14.3   Hematocrit (CBC) 42.6   Platelet Count (CBC) 294   MCV 90   MCH 30.2   MCHC 33.5   RDW   15.3   Assessment/Plan:   Orders/Results reviewed Orders reviewed and updated, Laboratory results reviewed, Vital Signs reviewed     Assessment/Plan 29 yo F who is [redacted] weeks pregnant present with DKA. I suspect her N/V is due to DKA, however alternatively she could have hyperemesis due to the pregnancy.   1. DM1 / DKA - DKA could be due to noncompliance with scheduled insulin. No infection has been identified, however a UA has not been obtained. A UA will be obtained to screen for UTI. She will be admitted and started on an insulin drip and IVF will be continued. Chemistry panel will be followed closely. Once the AG has closed and FSBS is <180 x 3 hours, SQ insulin can be started. Will continue her regimen of Lantus 15 units and NovoLog 5 units qAC plus SSI.  2. Diet - keep NPO for now. OK to inititate a diet once IV insulin has been stopped. She does not like meat so a vegetarian diet would be appropriate. 3. Hypothyroidism - continue levothyroxine. 4. N/V - antiemetics prn.  I will be unavailable to see pt tomorrow. Dr [place] Reek will see the patient for me and I will return to see her on Wednesday 07/11/11.   Electronic Signatures: Judi Cong (MD)  (Signed 21-Jan-13 20:50)  Authored: Chief Complaint and History, ALLERGIES, HOME MEDICATIONS, Review of Systems, Physcial Exam, Vital Signs, LABS, Assessment/Plan   Last Updated: 21-Jan-13 20:50 by Judi Cong (MD)

## 2014-10-10 NOTE — Consult Note (Signed)
Impression: 29yo BF w/ h/o type I DM admitted with DKA.  She was recently admitted to another hospital with UTI.  She did not fill her antibiotic Rx after discharge.  Her u/a had pyuria on admission.  Despite this, her urine culture and blood cultures were negative on admission.  It is unclear to me whether she is actively infected.  She has leukocytosis which can occur with DKA, but as she is out of DKA, I would expect that this would rapidly return to normal. Infection is certainly a possible cause of DKA, but she does not relate any specific symptoms.  She denies any cough, but she had SOB which is likely from ketoacidosis.  No urinary symptoms.  No GI symptoms. Will change her antibiotics to levofloxacin.  Would treat for 7 days. Follow WBC. 5) Would d/c foley catheter as soon as possible.  Electronic Signatures: Maxx Pham, Rosalyn GessMichael E (MD) (Signed on 13-May-13 16:11)  Authored   Last Updated: 13-May-13 16:15 by Timoteo Carreiro, Rosalyn GessMichael E (MD)

## 2014-10-10 NOTE — Consult Note (Signed)
PATIENT NAME:  [name] Ross, [name] Ross MR#:  [id number] DATE OF BIRTH:  [birth year]  DATE OF CONSULTATION:  06/17/2011  REFERRING PHYSICIAN:  Annamarie MajorPaul Harris, MD CONSULTING PHYSICIAN:  Elon AlasKamran N. Antowan Samford, MD  PRIMARY CARE PHYSICIAN: Dr. Martha ClanWilliam Shaw  REASON FOR CONSULTATION: Diabetic ketoacidosis.   HISTORY OF PRESENT ILLNESS: The patient is a 29 year old female who is pregnant for the first time and states she is currently in her sixth week of pregnancy. She also has a history of type 1 diabetes mellitus, hypothyroidism, and history of adrenal insufficiency which she states has resolved, history of anxiety/depression with diabetic neuropathy and gastropathy, history of acute renal failure and history of respiratory failure who presents to the emergency department with complaints of nausea and multiple episodes of vomiting over the past three days associated with decreased p.o. intake. She states she has been unable to keep down any solid food or liquids as she develops vomiting would attempts towards p.o. intake over the past three days. She denies any abdominal pain. She denies fevers or chills. She reports compliance with her insulin at home. She states she is in her sixth week of pregnancy but is yet to see an obstetrician and is currently awaiting an appointment to see Dr. Sherryll BurgerShah, on in January 2013. She came to the ER for further evaluation of nausea and vomiting. The patient was noted to have profoundly elevated serum blood glucose level with anion gap metabolic acidosis consistent with DKA for which medical consultation was requested. Otherwise, the patient is without specific complaints at this time.   PAST SURGICAL HISTORY: No past surgical history.  PAST MEDICAL HISTORY:  1. Type 1 diabetes mellitus with a history of DKA in August 2010. 2. History of acute renal failure.  3. History of adrenal insufficiency/Addison's disease, which she states has resolved.  4. Hypothyroidism.  5. History of  respiratory failure. 6. History of acute renal failure. 7. History of diabetic neuropathy and gastropathy.  8. History of anxiety/depression with history of severe depression in the past.   DRUG ALLERGIES: No known drug allergies.   MEDICATIONS: 1. Lantus 14 units subcutaneous every a.m.  2. Novolin sliding scale insulin three times daily. 3. Synthroid 75 mcg daily.  4. Multivitamin 1 tablet daily.   FAMILY HISTORY: Grandfather with history of type 2 diabetes mellitus. Mother is an alcoholic and smokes tobacco. Father is alive and healthy.   SOCIAL HISTORY: Negative for tobacco, alcohol, or illicit drug use. [place] home with her mother. She is in the process of moving. 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   REVIEW OF SYSTEMS: CONSTITUTIONAL: Denies fevers or chills. She states she has had appropriate weight gain since she has been pregnant. She denies any pain, weakness, fatigue, or lethargy. HEAD/EYES: Denies headache or blurry vision. ENT: Denies tinnitus, earache, nasal discharge or sore throat. RESPIRATORY: Denies shortness of breath, cough, or wheeze. CARDIOVASCULAR: Denies chest pain, heart palpitations, or lower extremity edema. GASTROINTESTINAL: Reports nausea and vomiting over the past two days. Denies abdominal pain, melena, hematochezia, diarrhea, or constipation. GENITOURINARY: No dysuria or hematuria. ENDOCRINE: Denies heat or cold intolerance. HEME/LYMPH: Denies easy bruising or bleeding. INTEGUMENT: Denies rash. MUSCULOSKELETAL: Denies joint pain or muscle weakness. NEURO: Denies headache, numbness, weakness, tingling, or dysarthria. PSYCH: Has underlying depression and anxiety.  PHYSICAL EXAMINATION:   VITALS: Temperature 98.4, pulse 124, blood pressure 117/78, respirations 18, and oxygen saturation 99% room air.  GENERAL: The patient is alert and oriented, not in acute distress.   HEENT:  Normocephalic, atraumatic. Pupils equal, round, and reactive to light and accommodation. Extraocular muscles are intact. Anicteric sclera. Conjunctiva pink. Hearing is intact to voice. Nares without drainage. Oral mucosa is dry, but without lesion.  NECK: Supple. Full range of motion. No JVD, lymphadenopathy, or carotid bruits bilaterally. No thyromegaly. No tenderness to palpation over thyroid gland.  LUNGS: Normal respiratory effort without use of accessory respiratory muscles. Lungs are clear to auscultation bilaterally without crackles, rales, or wheezing.   HEART: S1 and S2 positive, tachycardic. No murmurs, rubs, or gallops. PMI is lateralized.   ABDOMEN: Soft, gravid, nontender to palpation. Normoactive bowel sounds. Difficult to fully assess for organomegaly or palpable masses given her gravid abdomen. No hernia.  EXTREMITIES: No clubbing, cyanosis, or edema. No erythema. Pedal pulses are palpable bilaterally.  SKIN: No suspicious rashes.  LYMPH: No cervical lymphadenopathy.   NEURO: Alert and oriented x3. Cranial nerves II through XII grossly intact. No focal deficits.  PSYCH:  Appropriate affect.   LABS/STUDIES: CBC within normal limits, except for WBC elevated at 16.8. BMP: Sodium 137, potassium 4.8, chloride 103, bicarbonate 12, BUN 30, creatinine 1.75, glucose 530, calcium 9.1, and anion gap 22.   Serum beta-hCG 13,595.   Urine pregnancy test is positive.  Urinalysis: Hazy urine with greater than 500 mg/dL of glucose, 2+ ketones, 2+ blood, 30 mg/dL, protein, negative nitrite and leukocyte esterase, 3 RBCs, 8 WBCs, no bacteria.   ABG: On room air pH is 7.27, pCO2 less than 19, pO2 137, and bicarbonate not detectable.  Portable chest x-ray: No acute cardiopulmonary abnormalities are noted at this time.  EKG is pending at this time   ASSESSMENT AND PLAN: A 28 year old female with past medical history of insulin-dependent diabetes mellitus, hypothyroidism, apparent former  history of adrenal insufficiency/Addison's disease, and history of DKA in August 2010 with depression/anxiety and diabetic nephropathy and gastropathy who is currently pregnant who presents with three-day history of nausea, vomiting, and decreased p.o. intake with  1. Diabetic ketoacidosis - exact etiology of the patient's DKA is unclear at this time. There is no definite/obvious infectious etiology at this time and she denies medication noncompliance. This could be viral mediated. Recommend admission to Critical Care Unit and the patient will be admitted under the [facility] service. We will manage DKA in standard fashion with insulin drip and IV fluids. We will obtain every four hour BMP and magnesium levels while the patient is on an insulin drip and correct any abnormal electrolytes. We will keep the patient NPO for now except for water and ice chips. Once the patient's anion gap is closed, we will transition over to subcutaneous insulin (Lantus and NovoLog sliding scale insulin which she uses at home) and may need to adjust her regimen depending on her blood sugars as well as her weight. We will check hemoglobin A1c for now. I also strongly recommend endocrinology evaluation, but would defer the decision to the primary team. Further work-up and management to follow depending on the patient's clinical course and please see below for further details. 2. Acute renal failure - suspect prerenal etiology from volume depletion caused by recurrent vomiting and decreased p.o. intake. We will follow renal function closely with hydration/IV fluids and monitor ins and outs and avoid nephrotoxins. 3. Leukocytosis - suspect this is stress induced. The patient is afebrile. Blood and urine cultures have been drawn in the emergency room and will be followed and  the ER physician has also administered a dose of IV Rocephin. Otherwise we will hold off on antibiotics for now and follow WBC count closely with ongoing management of her  DKA. 4. Tachycardia - suspect from volume depletion caused by vomiting or stress induced from DKA. The patient's heart rate should hopefully improve with hydration and management of her DKA and also await EKG. She currently denies any chest pain or shortness of breath.  5. Hypothyroidism - we will continue Synthroid, check TSH and adjust accordingly. 6. Pregnancy - the patient states she is currently in her sixth week of pregnancy. Her pregnancy will be managed by the [facility] service and she is currently on multivitamins. Suspect she will also need folic acid therapy as well to prevent neural tube defects. She has a follow-up appointment to see Dr. Sherryll Burger on 07/09/2011.  7. Deep vein thrombosis prophylaxis - SCDs and TEDs hose for now.   CODE STATUS: FULL CODE.      Thank you for this consultation and for allowing me to be involved in Ms. Welter's care. We will continue to follow.  ____________________________ Elon Alas, MD knl:slb D: 06/17/2011 17:00:00 ET T: 06/18/2011 10:08:37 ET JOB#: [id number]  cc: Elon Alas, MD, <Dictator> Dierdre Searles, MD Dr. Army Chaco Madeliene Tejera MD ELECTRONICALLY SIGNED 06/21/2011 16:25

## 2014-10-10 NOTE — Discharge Summary (Signed)
PATIENT NAME:  [name] Ross Ross, [name] Ross MR#:  [id number] DATE OF BIRTH:  1986/06/12  DATE OF ADMISSION:  10/25/2011 DATE OF DISCHARGE:  11/01/2011  DISCHARGE DIAGNOSES:  1. Encephalopathy. 2. Septic shock.  3. Urinary tract infection. 4. Acute renal failure. 5. Diabetic ketoacidosis.  6. Hypoglycemia. 7. Anemia. 8. Hypertension. 9. Diabetes, type 1.   CONDITION: Stable.   ADDITIONAL MEDICATIONS:  1. Levaquin 250 mg p.o. daily for three days. 2. Lantus 5 units sub-Q at bedtime. 3. Levothyroxine 50 mcg p.o. q.a.m.  4. Norvasc 10 mg p.o. daily.  5. Hydrocortisone 10 mg p.o. t.i.d. for three days.  6. NovoLog 3 units after meals plus sliding scale   DIET: Low sodium, ADA diet.   ACTIVITY: As tolerated.   FOLLOW-UP CARE: Follow up with primary care physician within one week and follow up with Dr. Cherylann RatelLateef within 1 to 2 weeks.    CONSULTATIONS:  1. Nephrology, Dr. Cherylann RatelLateef. 2. Endocrinology, Dr. Tedd SiasSolum. 3. Pulmonary, Dr. Meredeth IdeFleming. 4. Infectious disease, Dr. Leavy CellaBlocker   REASON FOR ADMISSION: Altered mental status and not feeling well.   HOSPITAL COURSE: Patient is a 29 year old Caucasian female with history of diabetes, type 1 since age 89, Addison's disease, depression, peripheral neuropathy secondary to diabetes and diabetic gastroparesis was brought in ED by her mother with not feeling well and altered mental status along with feeling cold and clammy. Patient's blood pressure was 81/40. She was finally intubated and placed on vent. She was found to have be very acidotic, dehydrated and has high white count at 25,000. She was admitted for septic shock, diabetic ketoacidosis, metabolic encephalopathy to Critical Care Unit. On admission laboratory is as follows: Urinalysis positive for urinary tract infection. Glucose 645, BUN 26, creatinine 3.21, sodium 131, potassium 6.3, chloride 99, bicarbonate 5, SGOT 14, albumin 2.1, anion gap 27, WBC 25.2, hemoglobin 8.1, platelets 505, lactic acid 2.8. ABG  show pH 6.92, pCO2 22, bicarbonate and base excess unable to calculate. EKG showed normal sinus rhythm. After admission patient has been treated with IV fluid support, insulin drip for diabetic ketoacidosis and hyperglycemia. In addition, patient was placed on vent due to altered mental status and respiratory distress. For urinary tract infection patient has been treated with Zosyn, vancomycin initially and then changed to p.o. Levaquin according to Dr. Sharrell KuBlocker's recommendation. Patient extubated herself on day three. Patient's anion gap became normal. Insulin drip was stopped. Dr. Tedd SiasSolum  recommended Lantus 20 units sub-Q, however, patient developed hypoglycemia so Lantus dose was adjusted to 5 units sub-Q daily. Patient's blood sugar has been stable above 100 since yesterday.   For septic shock, patient was on Levophed drip. Since patient's blood pressure improved Levophed was stopped, however, patient developed hypertension so patient is placed on Norvasc for hypertension.   For acute renal failure, initially patient was treated with IV fluids. According to Dr. Cherylann RatelLateef patient got several times of hemodialysis. Her creatinine has got better so Dr. Cherylann RatelLateef recommends no more dialysis, however, creatinine increased from about 1.8, to 2.4 but has been stable. According to Dr. Thedore MinsSingh patient may be discharged and follow up nephrology as outpatient.   Hyperkalemia. Patient initially had hyperkalemia. Potassium was 6.8, was placed on dialysis and IV fluid, however, patient developed hypokalemia later and treated with supplement and potassium level is normal now. In addition, patient also has hypomagnesium and she was treated with magnesium sulfate, now level is normal.   Anemia. Patient has anemia. Initially was treated with blood transfusion 1 unit after hemoglobin dropped to 8.0,  however, after blood transfusion patient's hemoglobin dropped to 8.0 again so we gave another unit of blood transfusion. After blood  transfusion patient's hemoglobin increased to 9.3. After above-mentioned treatment, patient's symptoms have improved. She has no complaints.   Vital signs today as follows: Temperature 98.9, blood pressure 152/83, pulse 82, respirations 18, oxygen saturation 92% on room air. Blood sugar 126. Physical examination unremarkable except facial edema.   Laboratory data today: Glucose 134, BUN 19, creatinine 2.41, potassium 4.0, sodium 143, bicarbonate 28, WBC 13.1, hemoglobin 9.3, platelets 199.   Patient is clinically stable, will be discharged to home today. Discussed patient's discharge plan with the patient, nurse, and case manager.   Patient needs follow-up with PCP within one week, monitor hemoglobin and blood sugar. In addition, patient need follow up with Dr. Cherylann Ratel for renal insufficiency.   TIME SPENT: About 55 minutes.  ____________________________ Shaune Pollack, MD qc:cms D: 11/01/2011 12:14:19 ET T: 11/01/2011 13:51:17 ET JOB#: [id number]  cc: Shaune Pollack, MD, <Dictator> Shaune Pollack MD ELECTRONICALLY SIGNED 11/02/2011 17:44

## 2014-10-10 NOTE — Consult Note (Signed)
Chief Complaint and History:   Referring Physician Dr. Allena KatzPatel    Chief Complaint DKA   Allergies:  No Known Allergies:   Assessment/Plan:   Assessment/Plan 29 year old female with DM1 and recurrent DKA admitted in septic shock, with DKA and acute renal failure. Intubated in unit and receiving dialysis. Pt was seen and examined and chart was reviewed. She was last admitted in 06/2011 when pregnant but her pregnancy test is now negative, suggesting she terminated the pregnancy or had a spontaneous AB.  A/P 1. DKA - resolved 2. Uncontrolled type 2 diabetes - she received Lantus 50 units SQ this AM and since has had low sugars, despite steroids and IV dextrose. Will decrease the Lantus to 20 units tomorrow morning (out-pt dose should be 17 units daily). Will change SSI to NovoLog q6hrs until eating. Add prandial insulin (with NovoLog 4 units qAC) when eating. Continue q1 hr FSBS with no insulin coverage for now. 3. Hypothyroidism - during last hosp in 06/2011, she was discharged on levothyroxine 50 mcg daily. Will give 1/2 dose IV for now, until eating (then change to 50 mcg PO). When clinically improved, we can check a TSH. Likely would be low now in setting of acute illness. 4. History of adrenal insufficiency - she reported had AI briefly in 2009 but was reassessed at that time and found to have a normal cortisol level. During recent hospitalizations this year, she'd not been on or required steroids. Consider stopping hydrocortisone.  Full consult has been dictated. I will not be available over the weekend.   Electronic Signatures: Raj JanusSolum, Edell Mesenbrink M (MD)  (Signed 10-May-13 17:26)  Authored: Chief Complaint and History, ALLERGIES, HOME MEDICATIONS, Assessment/Plan   Last Updated: 10-May-13 17:26 by Raj JanusSolum, Kirstine Jacquin M (MD)
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
## 2014-10-10 NOTE — Consult Note (Signed)
PATIENT NAME:  [name], [name] Ross DATE OF BIRTH:  23-Dec-1985  DATE OF CONSULTATION:  10/25/2011  REFERRING PHYSICIAN:  Enedina FinnerSona Patel, MD  CONSULTING PHYSICIAN:  Artavious Trebilcock Lizabeth LeydenN. Annel Zunker, MD  REASON FOR CONSULTATION: Severe acute renal failure, severe metabolic acidosis, septic shock.   HISTORY OF PRESENT ILLNESS: The patient is a 29 year old Caucasian female with longstanding type I diabetes mellitus, Addison's disease, depression, peripheral neuropathy, diabetic gastroparesis, and recent urinary tract infection who presented to [facility] with altered mental status and malaise today. Apparently the patient had a recent admission at [facility] for urinary tract infection. As above, she came in with altered mental status. She had a very low blood pressure of 81/40 when she first came in. Per the dictated history and physical, it appears that she was screaming and yelling and in order to calm her down she ultimately underwent intubation and placed on the ventilator. She was found to have severe metabolic derangements. Initial presentation demonstrated that she had a pH quite low of 6.92. It remained 6.92 despite bicarbonate drip at present. In addition, she appeared to have severe acute renal failure with a presenting creatinine of 3.21. This has actually gotten worse over the past several hours and is now up to 3.3. In addition, she has worsening hyperkalemia with a serum potassium of 6.9. Serum bicarbonate is also very low at 6. The patient also has septic shock and is currently on pressor therapy. Dr. Allena KatzPatel asked me to evaluate her for the potential of renal replacement therapy.   PAST MEDICAL HISTORY:  1. Longstanding diabetes mellitus.  2. Addison's disease.  3. Peripheral neuropathy.  4. Diabetic gastroparesis.  5. Prior episodes of acute renal failure.   PAST SURGICAL HISTORY: Unable to determine at this point in time.   DRUG ALLERGIES: No known drug  allergies.   CURRENT INPATIENT MEDICATIONS:  1. Fentanyl drip. 2. Insulin drip. 3. Midazolam drip. 4. Norepinephrine drip. 5. Half normal saline at 125 mL/h with 100 mEq of sodium bicarbonate.  6. Normal saline at 200 mL/h.  7. Heparin 5000 units sub-Q q.12 hours.  8. Solu-Cortef 100 mg IV q.6 hours.  9. Morphine 1 mg IV q.3 hours p.r.n.  10. Zofran 4 mg q.6 hours p.r.n. nausea and vomiting.  11. Protonix 40 mg IV q.6 a.m.  12. Zosyn 3.375 grams IV q.8 hours.   SOCIAL HISTORY: The patient [place] in [place]. 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 No reported tobacco or alcohol use. This information was obtained through dictated history and physical.   FAMILY HISTORY: Unable to obtain directly from the patient, however, dictated history and physicals states that hypertension is present in the patient's family.   REVIEW OF SYSTEMS: Currently unobtainable from the patient as she is intubated and sedated.   PHYSICAL EXAMINATION:   VITAL SIGNS: Temperature 94.4, pulse 84, respirations 38, blood pressure 118/52, pulse oximetry 100% on 35% FiO2.   GENERAL: Critically ill appearing young female seen in the Critical Care Unit.   HEENT: Normocephalic, atraumatic. There is periorbital edema noted. Pupils were 3 mm and minimally reactive. Endotracheal tube and oral gastric tube were noted to be in place.   NECK: Supple without JVD or lymphadenopathy.   LUNGS: Lungs are currently clear to auscultation. Breaths are vent assisted.   CARDIOVASCULAR: S1, S2. Regular rate and rhythm. No murmurs, rubs, or gallops appreciated.   ABDOMEN: Soft, nontender, nondistended. Bowel sounds present. No rebound. No guarding. No appreciable organomegaly.  EXTREMITIES: No clubbing, cyanosis, or edema.   NEUROLOGIC: The patient is currently intubated and sedated.   MUSCULOSKELETAL: No joint redness, swelling, or tenderness appreciated.   SKIN: Skin is quite cool to the touch in  general. No obvious skin rashes noted.   PSYCHIATRIC: Unable to assess at this time.   LABORATORY DATA: Sodium 131, potassium 6.9, chloride 101, CO2 6, BUN 29, creatinine 3.32, glucose 640, calcium 7.2, total protein 6.9, albumin 2.1, total bilirubin 0.5, alkaline phosphatase 111, AST 14, ALT 13. TSH is low at 0.062. CBC shows WBC 25.2, hemoglobin 8.1, hematocrit 28, platelets 505, MCV 108. Urinalysis shows clarity that was turbid, urine glucose greater than 500, urine protein greater than 500, 333 RBCs per high-power field, 921 WBCs per high-power field, trace bacteria, 2+ epithelial cells per high-power field. Initial ABG showed pH of 6.92, pCO2 22, pO2 91, FiO2 21%. Most recent ABG shows pH 6.92, pCO2 21, pO2 120, 35% FiO2.   IMPRESSION: This is a 29 year old African American female with past medical history of type I diabetes mellitus, Addison's disease, peripheral neuropathy, diabetic gastroparesis, depression, prior episodes of acute renal failure, prior episodes of DKA who presented to [facility] with altered mental status, malaise, and found to have severe metabolic derangements including acute renal failure, severe metabolic acidosis, and hyperkalemia.   PROBLEM LIST:  1. Severe acute renal failure.  2. Hyperkalemia.  3. Severe metabolic acidosis.  4. Septic shock likely due to urinary source.  5. Respiratory failure.  6. Altered mental status.  7. Diabetic ketoacidosis.   PLAN: The patient presents with a very severe illness at this point in time. She is currently intubated and sedated and has severe metabolic acidosis with associated acute renal failure and diabetic ketoacidosis. She also has hyperkalemia. She is clearly in need of renal replacement therapy at this point in time to assist with her metabolic derangements. I have spoken with the patient's mother and she has consented to continuous renal placement therapy. I have consulted Dr. Gilda Crease urgently to place  a temporary dialysis catheter for initiation of continuous renal replacement therapy. We will start with a dialysate flow rate of 2.5 liters using a 2 potassium bath. Blood flow rate will be set at 350. We will not perform any ultrafiltration at this point in time. We will also obtain a renal ultrasound to exclude obstruction as a cause of her acute renal failure.   In regards to her septic shock, I would continue use of pressors. We will need to monitor the patient's blood pressure quite closely while she is on continuous renal replacement therapy. The patient has a history of Addison's disease which could potentially be playing a role in her illness now. She has appropriately been started on Solu-Cortef for adrenal support. I will change sodium bicarbonate drip to 150 mEq to be administered at 100 mL per hour. The patient has a guarded prognosis at this point in time and further plan will be based upon the patient's progress.   I would like to thank Dr. Allena Katz for this kind referral. We will certainly be available for the rest of the night to provide care to the patient.   ____________________________ Lennox Pippins, MD [id number] D: 10/25/2011 21:35:00 ET T: 10/26/2011 06:11:41 ET JOB#: [id number]  cc: Lennox Pippins, MD, <Dictator> Ria Comment Theta Leaf MD ELECTRONICALLY SIGNED 11/22/2011 14:55

## 2015-02-08 IMAGING — CR DG CHEST 1V PORT
1 series · 1 of 1 positions shown · non-contrast
Comparison: 09/06/2013 and 09/04/2013

CLINICAL DATA: Diabetic ketoacidosis.  Shock.

EXAM:
PORTABLE CHEST - 1 VIEW

[AP]
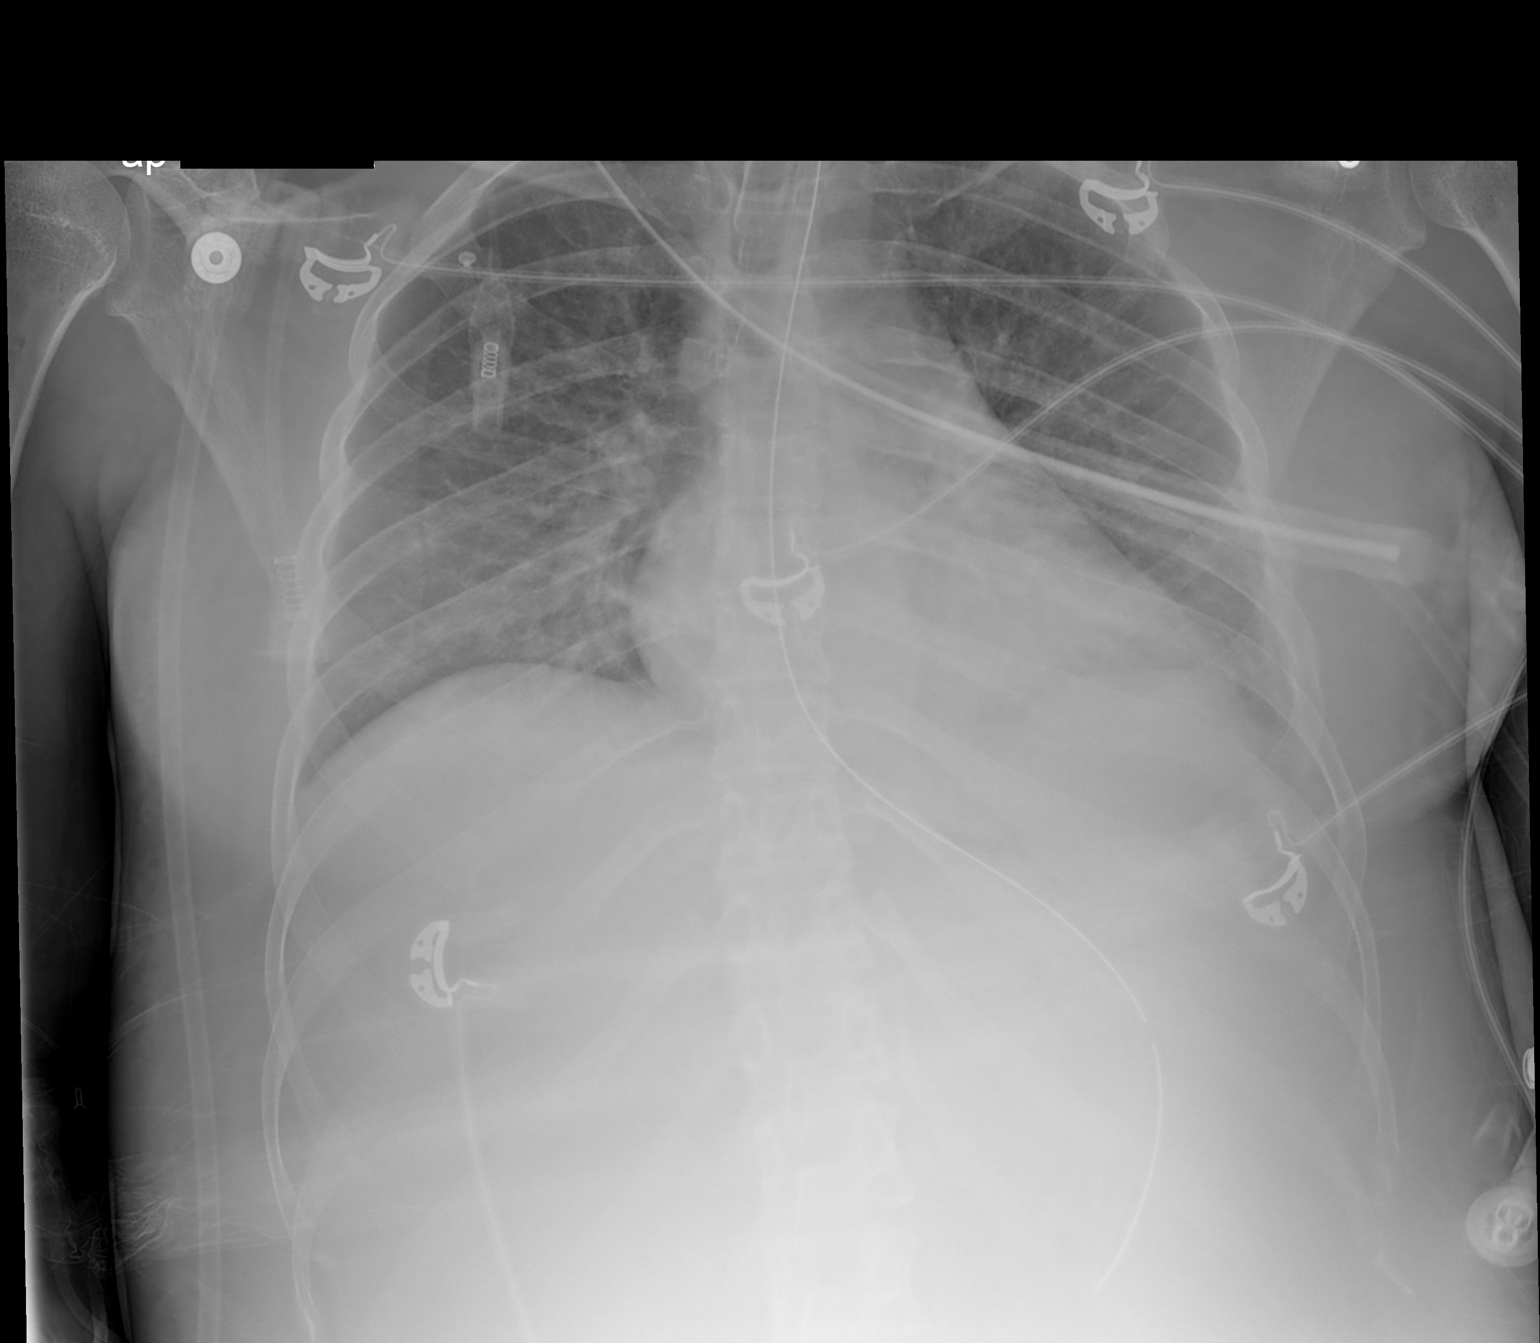

[1 of 1 positions shown; findings below may reference images not displayed]

FINDINGS: Endotracheal tube and NG tube appear in good position. There is
increasing density at the lung bases consistent with atelectasis.
Heart size and pulmonary vascularity are normal.
IMPRESSION: Increasing slight atelectasis at the lung bases.
